# Patient Record
Sex: Female | Born: 1935
Health system: Southern US, Community
[De-identification: ages and names within clinical notes are randomized; demographics above are authoritative.]

## PROBLEM LIST (undated history)

## (undated) DIAGNOSIS — M1990 Unspecified osteoarthritis, unspecified site: Secondary | ICD-10-CM

## (undated) DIAGNOSIS — F32A Depression, unspecified: Secondary | ICD-10-CM

## (undated) DIAGNOSIS — G8929 Other chronic pain: Secondary | ICD-10-CM

## (undated) DIAGNOSIS — H3530 Unspecified macular degeneration: Secondary | ICD-10-CM

## (undated) DIAGNOSIS — F329 Major depressive disorder, single episode, unspecified: Secondary | ICD-10-CM

## (undated) DIAGNOSIS — I10 Essential (primary) hypertension: Secondary | ICD-10-CM

## (undated) DIAGNOSIS — E785 Hyperlipidemia, unspecified: Secondary | ICD-10-CM

## (undated) DIAGNOSIS — S129XXA Fracture of neck, unspecified, initial encounter: Secondary | ICD-10-CM

## (undated) DIAGNOSIS — F0390 Unspecified dementia without behavioral disturbance: Secondary | ICD-10-CM

## (undated) DIAGNOSIS — F419 Anxiety disorder, unspecified: Secondary | ICD-10-CM

## (undated) HISTORY — PX: APPENDECTOMY: SHX54

## (undated) HISTORY — PX: EYE SURGERY: SHX253

## (undated) HISTORY — DX: Hyperlipidemia, unspecified: E78.5

## (undated) HISTORY — PX: BACK SURGERY: SHX140

## (undated) HISTORY — DX: Unspecified osteoarthritis, unspecified site: M19.90

---

## 2001-02-04 ENCOUNTER — Other Ambulatory Visit: Admission: RE | Admit: 2001-02-04 | Discharge: 2001-02-04 | Payer: Self-pay | Admitting: Internal Medicine

## 2002-02-17 ENCOUNTER — Other Ambulatory Visit: Admission: RE | Admit: 2002-02-17 | Discharge: 2002-02-17 | Payer: Self-pay | Admitting: Internal Medicine

## 2003-03-02 ENCOUNTER — Other Ambulatory Visit: Admission: RE | Admit: 2003-03-02 | Discharge: 2003-03-02 | Payer: Self-pay | Admitting: Internal Medicine

## 2006-04-05 ENCOUNTER — Encounter: Admission: RE | Admit: 2006-04-05 | Discharge: 2006-04-05 | Payer: Self-pay | Admitting: Internal Medicine

## 2006-04-17 ENCOUNTER — Encounter: Admission: RE | Admit: 2006-04-17 | Discharge: 2006-04-17 | Payer: Self-pay | Admitting: Internal Medicine

## 2010-04-16 ENCOUNTER — Encounter: Payer: Self-pay | Admitting: Internal Medicine

## 2011-02-24 DIAGNOSIS — I10 Essential (primary) hypertension: Secondary | ICD-10-CM | POA: Insufficient documentation

## 2011-02-24 DIAGNOSIS — Z79899 Other long term (current) drug therapy: Secondary | ICD-10-CM | POA: Insufficient documentation

## 2011-02-24 DIAGNOSIS — M549 Dorsalgia, unspecified: Secondary | ICD-10-CM | POA: Insufficient documentation

## 2011-02-24 DIAGNOSIS — M545 Low back pain, unspecified: Secondary | ICD-10-CM | POA: Insufficient documentation

## 2011-02-25 ENCOUNTER — Emergency Department (HOSPITAL_COMMUNITY): Payer: Medicare Other

## 2011-02-25 ENCOUNTER — Encounter: Payer: Self-pay | Admitting: Emergency Medicine

## 2011-02-25 ENCOUNTER — Emergency Department (HOSPITAL_COMMUNITY)
Admission: EM | Admit: 2011-02-25 | Discharge: 2011-02-25 | Disposition: A | Discharge status: Home / Self Care | Payer: Medicare Other | Attending: Emergency Medicine | Admitting: Emergency Medicine

## 2011-02-25 DIAGNOSIS — M549 Dorsalgia, unspecified: Secondary | ICD-10-CM

## 2011-02-25 HISTORY — DX: Essential (primary) hypertension: I10

## 2011-02-25 LAB — URINALYSIS, ROUTINE W REFLEX MICROSCOPIC
Bilirubin Urine: NEGATIVE
Specific Gravity, Urine: 1.013 (ref 1.005–1.030)
pH: 6 (ref 5.0–8.0)

## 2011-02-25 LAB — URINE MICROSCOPIC-ADD ON

## 2011-02-25 MED ORDER — DIAZEPAM 2 MG PO TABS: 2.0000 mg | ORAL_TABLET | Freq: Three times a day (TID) | ORAL | Status: DC | PRN

## 2011-02-25 MED ORDER — HYDROCODONE-ACETAMINOPHEN 10-325 MG PO TABS
1.0000 | ORAL_TABLET | Freq: Once | ORAL | Status: AC
  Administered 2011-02-25: 1 via ORAL
  Filled 2011-02-25: qty 1

## 2011-02-25 MED ORDER — DIAZEPAM 2 MG PO TABS
2.0000 mg | ORAL_TABLET | Freq: Once | ORAL | Status: AC
  Administered 2011-02-25: 2 mg via ORAL
  Filled 2011-02-25: qty 1

## 2011-02-25 NOTE — ED Notes (Signed)
Pt states she did have xrays done at MD office. Pt also states since last night having some incontinence issues.

## 2011-02-25 NOTE — ED Notes (Signed)
Pt c/o low back pain onset Nov 21, pt states pain radiating down both legs. Denies injury. Pt did see Dr. Selena Batten for pain control was given Tramadol,Hydrocodone and flexeril. No relief

## 2011-02-25 NOTE — ED Notes (Signed)
Patient given DC instructions with follow up care Patient gave verbal understanding Patient is being DC via wheelchair to home with family Patient VS stable.  She is not showing any signs of distress on DC

## 2011-02-25 NOTE — ED Provider Notes (Signed)
History     CSN: [id number] Arrival date & time: 02/25/2011 12:49 AM   First MD Initiated Contact with Patient 02/25/11 0113      Chief Complaint  Patient presents with  . Back Pain    (Consider location/radiation/quality/duration/timing/severity/associated sxs/prior treatment) HPI The patient presents with low back pain. She was in her usual state of health prior to 10 days ago. About that time she gradually developed pain across her lower back. Since onset the pain has become more significant, described as sharp, throbbing. The pain is radiating down both buttocks and proximal posterior thighs. The pain is worse with ambulation, better at rest and while sitting. The patient denies any dysuria, fever, chills, nausea, vomiting, diarrhea, abdominal pain. The patient is seen her primary care physician twice over this time course, and most recently was prescribed tramadol, hydrocodone, Flexeril, which did not seem to offer any relief. Past Medical History  Diagnosis Date  . Hypertension     Past Surgical History  Procedure Date  . Appendectomy   . Eye surgery     History reviewed. No pertinent family history.  History  Substance Use Topics  . Smoking status: Never Smoker   . Smokeless tobacco: Not on file  . Alcohol Use: No    OB History    Grav Para Term Preterm Abortions TAB SAB Ect Mult Living   3 3 3              Review of Systems  All other systems reviewed and are negative.    Allergies  Review of patient's allergies indicates no known allergies.  Home Medications   Current Outpatient Rx  Name Route Sig Dispense Refill  . ACETAMINOPHEN 500 MG PO TABS Oral Take 500 mg by mouth every 6 (six) hours as needed. Pain       . BUPROPION HCL ER (SR) 150 MG PO TB12 Oral Take 150 mg by mouth 2 (two) times daily.      Marland Kitchen CALCIUM CITRATE-VITAMIN D 200-200 MG-UNIT PO TABS Oral Take 1 tablet by mouth daily.      Marland Kitchen VITAMIN D 1000 UNITS PO TABS Oral Take 1,000 Units by  mouth daily.      . CYCLOBENZAPRINE HCL 10 MG PO TABS Oral Take 5-10 mg by mouth 3 (three) times daily as needed. Back pain    . HYDROCODONE-ACETAMINOPHEN 5-500 MG PO TABS Oral Take 1 tablet by mouth every 6 (six) hours as needed. Pain     . IBUPROFEN 200 MG PO TABS Oral Take 200 mg by mouth every 6 (six) hours as needed.      . ICAPS AREDS FORMULA PO Oral Take 1 capsule by mouth 2 (two) times daily.      Marland Kitchen OLMESARTAN MEDOXOMIL 20 MG PO TABS Oral Take 20 mg by mouth daily.      . TRAMADOL HCL 50 MG PO TABS Oral Take 50 mg by mouth every 6 (six) hours as needed. For back pain Maximum dose= 8 tablets per day       BP 151/83  Pulse 106  Temp(Src) 97.1 F (36.2 C) (Oral)  Resp 18  SpO2 100%  Physical Exam  Constitutional: She is oriented to person, place, and time. She appears well-developed and well-nourished.  HENT:  Head: Normocephalic and atraumatic.  Eyes: EOM are normal.  Cardiovascular: Normal rate and regular rhythm.   Pulmonary/Chest: Effort normal and breath sounds normal.  Abdominal: She exhibits no distension.  Musculoskeletal: She exhibits no edema.  Negative straight leg test with either leg. The pain the patient notes with prominently is about the left superior sacroiliac joint. No appreciable bony deformities in the patient's back.  Neurological: She is alert and oriented to person, place, and time.  Skin: Skin is warm and dry.    ED Course  Procedures (including critical care time)   Labs Reviewed  URINALYSIS, ROUTINE W REFLEX MICROSCOPIC   No results found.   No diagnosis found.  XR: no acute findings  MDM  This elderly female presents with one week of low back pain. On exam the patient is in no distress, has no overt signs of systemic infection. Has negative straight leg test his reassuring. The patient had x-ray which demonstrates degenerative changes. The patient noted minimal improvement with oral medications in the ED, but noted that she preferred  to go home rather than stay for inpatient analgesics and further evaluation. She was discharged with additional medications to follow up with her primary care physician in one day        Gerhard Munch, MD 02/25/11 [phone]

## 2011-02-27 ENCOUNTER — Emergency Department (HOSPITAL_COMMUNITY): Payer: Medicare Other

## 2011-02-27 ENCOUNTER — Encounter (HOSPITAL_COMMUNITY): Payer: Self-pay

## 2011-02-27 ENCOUNTER — Inpatient Hospital Stay (HOSPITAL_COMMUNITY)
Admission: EM | Admit: 2011-02-27 | Discharge: 2011-03-05 | DRG: 478 | Disposition: A | Discharge status: Skilled Nursing Facility | Payer: Medicare Other | Attending: Internal Medicine | Admitting: Internal Medicine

## 2011-02-27 DIAGNOSIS — E871 Hypo-osmolality and hyponatremia: Secondary | ICD-10-CM

## 2011-02-27 DIAGNOSIS — M549 Dorsalgia, unspecified: Secondary | ICD-10-CM

## 2011-02-27 DIAGNOSIS — M4640 Discitis, unspecified, site unspecified: Secondary | ICD-10-CM | POA: Insufficient documentation

## 2011-02-27 DIAGNOSIS — J302 Other seasonal allergic rhinitis: Secondary | ICD-10-CM

## 2011-02-27 DIAGNOSIS — I10 Essential (primary) hypertension: Secondary | ICD-10-CM

## 2011-02-27 DIAGNOSIS — M519 Unspecified thoracic, thoracolumbar and lumbosacral intervertebral disc disorder: Principal | ICD-10-CM | POA: Diagnosis present

## 2011-02-27 DIAGNOSIS — M810 Age-related osteoporosis without current pathological fracture: Secondary | ICD-10-CM | POA: Diagnosis present

## 2011-02-27 DIAGNOSIS — E785 Hyperlipidemia, unspecified: Secondary | ICD-10-CM

## 2011-02-27 DIAGNOSIS — H269 Unspecified cataract: Secondary | ICD-10-CM | POA: Insufficient documentation

## 2011-02-27 DIAGNOSIS — D649 Anemia, unspecified: Secondary | ICD-10-CM | POA: Diagnosis present

## 2011-02-27 DIAGNOSIS — R739 Hyperglycemia, unspecified: Secondary | ICD-10-CM

## 2011-02-27 HISTORY — DX: Unspecified macular degeneration: H35.30

## 2011-02-27 LAB — DIFFERENTIAL
Basophils Absolute: 0 10*3/uL (ref 0.0–0.1)
Eosinophils Absolute: 0.1 10*3/uL (ref 0.0–0.7)
Lymphocytes Relative: 18 % (ref 12–46)
Lymphs Abs: 1.3 10*3/uL (ref 0.7–4.0)
Monocytes Relative: 7 % (ref 3–12)
Neutro Abs: 5.3 10*3/uL (ref 1.7–7.7)

## 2011-02-27 LAB — BASIC METABOLIC PANEL
BUN: 15 mg/dL (ref 6–23)
CO2: 27 mEq/L (ref 19–32)
Creatinine, Ser: 0.58 mg/dL (ref 0.50–1.10)
GFR calc Af Amer: >90 mL/min (ref >90)
Glucose, Bld: 118 mg/dL — ABNORMAL HIGH (ref 70–99)
Potassium: 3.9 mEq/L (ref 3.5–5.1)
Sodium: 132 mEq/L — ABNORMAL LOW (ref 135–145)

## 2011-02-27 LAB — CBC
HCT: 33.9 % — ABNORMAL LOW (ref 36.0–46.0)
Platelets: 294 10*3/uL (ref 150–400)

## 2011-02-27 LAB — SEDIMENTATION RATE: Sed Rate: 7 mm/hr (ref 0–22)

## 2011-02-27 MED ORDER — VANCOMYCIN HCL IN DEXTROSE 1-5 GM/200ML-% IV SOLN
1000.0000 mg | Freq: Once | INTRAVENOUS | Status: AC
  Administered 2011-02-27: 1000 mg via INTRAVENOUS
  Filled 2011-02-27: qty 200

## 2011-02-27 MED ORDER — FENTANYL 25 MCG/HR TD PT72
25.0000 ug | MEDICATED_PATCH | TRANSDERMAL | Status: DC
  Administered 2011-02-27 – 2011-03-05 (×3): 25 ug via TRANSDERMAL
  Filled 2011-02-27 (×2): qty 1

## 2011-02-27 MED ORDER — FENTANYL 25 MCG/HR TD PT72: 1.0000 | MEDICATED_PATCH | TRANSDERMAL | Status: AC

## 2011-02-27 MED ORDER — FENTANYL CITRATE 0.05 MG/ML IJ SOLN
50.0000 ug | Freq: Once | INTRAMUSCULAR | Status: AC
  Administered 2011-02-27: 50 ug via INTRAVENOUS
  Filled 2011-02-27: qty 2

## 2011-02-27 MED ORDER — MORPHINE SULFATE 2 MG/ML IJ SOLN
0.5000 mg | INTRAMUSCULAR | Status: DC | PRN
  Administered 2011-02-27: 0.5 mg via INTRAVENOUS
  Filled 2011-02-27: qty 1

## 2011-02-27 MED ORDER — ONDANSETRON HCL 4 MG/2ML IJ SOLN
INTRAMUSCULAR | Status: AC
  Administered 2011-02-27: 4 mg
  Filled 2011-02-27: qty 2

## 2011-02-27 NOTE — ED Notes (Signed)
To MRI.  Family at bedside.

## 2011-02-27 NOTE — ED Notes (Signed)
Called [facility]CU for report, was told to call back in 10-15 minutes.

## 2011-02-27 NOTE — ED Notes (Signed)
Pt reports loss of bladder function x 2 days- Pt reports " I wait too long when I need to go yet it is too painful to get up" Deneis numbness and tingling- Pt is able to move all ext withotu difficulty

## 2011-02-27 NOTE — ED Notes (Signed)
Pt presents with NAD seen here on Saturday for presenting complaint.  Pt c/o of increased back pain despite OTC and RX meds given- Pt is unable to tolerate any position for comfort

## 2011-02-27 NOTE — H&P (Signed)
PCP:   Pearson Grippe, MD, MD   Chief Complaint:  Back Pain for 2 weeks   HPI: This is an 77 year old female, she was on her usual state of health prior to 2 weeks ago, she developed sudden onset lower back pain mainly affecting her right lower extremities, pain is sharp, 10 out of 10 radiating to both buttocks and proximal posterior thigh, the pain is worse with ambulation, better with rest and while sitting, she denies any numbness or weakness of her extremities, she has to see Dr. Selena Batten where an x-ray done, which did show degenerative change, patient plays on multiple pain medications including Vicodin, ibuprofen, tramadol without response, and today presented for the second time to the emergency room where patient underwent an MRI of the lumbar spine which did show possibility of discitis and osteomyelitis, case discussed with Dr. Marikay Alar the neurosurgery where he reviewed the MRI and he admitted that if ESR and C-reactive protein negative consider discharge the patient maintained at the lower back below the lumbar spine and it radiates to her right lower extremity pain. Condition did not associated with loss of bowel habit, or control, denies any loss of weight, denies any burning micturition, denies any fever.She complain of urinary incontinence. She denies any difficulty with working but admitted she did it is so painful to walk  Review of Systems:  The patient denies anorexia, fever, weight loss,, vision loss, decreased hearing, hoarseness, chest pain, syncope, dyspnea on exertion, peripheral edema, balance deficits, hemoptysis, abdominal pain, melena, hematochezia, severe indigestion/heartburn, hematuria,   genital sores, muscle weakness, suspicious skin lesions, transient blindness, depression, unusual weight change, abnormal bleeding, enlarged lymph nodes, angioedema, and breast masses.  Past Medical History: Past Medical History  Diagnosis Date  . Hypertension   . Osteoporosis   .  Macular degeneration    Past Surgical History  Procedure Date  . Appendectomy   . Eye surgery     Medications: Prior to Admission medications   Medication Sig Start Date End Date Taking? Authorizing Provider  acetaminophen (TYLENOL) 500 MG tablet Take 500 mg by mouth every 6 (six) hours as needed. Pain      Yes Historical Provider, MD  buPROPion (WELLBUTRIN SR) 150 MG 12 hr tablet Take 150 mg by mouth daily.    Yes Historical Provider, MD  calcium citrate-vitamin D 200-200 MG-UNIT TABS Take 1 tablet by mouth daily.    Yes Historical Provider, MD  cholecalciferol (VITAMIN D) 1000 UNITS tablet Take 1,000 Units by mouth every morning.    Yes Historical Provider, MD  diazepam (VALIUM) 2 MG tablet Take 2 mg by mouth every 8 (eight) hours as needed. MUSCLE SPASMS  02/25/11 03/07/11 Yes Gerhard Munch, MD  HYDROcodone-acetaminophen (VICODIN) 5-500 MG per tablet Take 1 tablet by mouth every 6 (six) hours as needed. Pain   Yes Historical Provider, MD  ibuprofen (ADVIL,MOTRIN) 200 MG tablet Take 200 mg by mouth every 6 (six) hours as needed. PAIN   Yes Historical Provider, MD  Multiple Vitamins-Minerals (ICAPS AREDS FORMULA PO) Take 1 capsule by mouth 2 (two) times daily.    Yes Historical Provider, MD  olmesartan (BENICAR) 20 MG tablet Take 20 mg by mouth daily.    Yes Historical Provider, MD  traMADol (ULTRAM) 50 MG tablet Take 50 mg by mouth every 6 (six) hours as needed. For back pain Maximum dose= 8 tablets per day PAIN   Yes Historical Provider, MD  fentaNYL (DURAGESIC - DOSED MCG/HR) 25 MCG/HR Place 1 patch (  25 mcg total) onto the skin every 3 (three) days. 02/27/11 03/29/11  Nelia Shi, MD    Allergies:  No Known Allergies  Social History:  reports that she has never smoked. She has never used smokeless tobacco. She reports that she does not drink alcohol or use illicit drugs.she lives alone  Family History: Non contributive Physical Exam: Filed Vitals:   02/27/11 1312 02/27/11  1620 02/27/11 2011  BP: 165/90 140/72 142/69  Pulse:  100 97  Temp: 98.1 F (36.7 C)  97.9 F (36.6 C)  TempSrc: Oral  Oral  Resp: 18 16 16   Weight: 48.988 kg (108 lb)    SpO2: 93% 99% 97%   Patient lying on bed not on pain, or respiratory distress pupil equal reactive to light and accommodation, neck supple no lymphadenopathy, Heart S1 and S2 with no added sounds Lung examination normal vesicular breathing with equal air entry Abdomen soft nontender bowel sounds present although she have her bladder seems full but a patient was patient is able to void Extremity without lower limb edema peripheral pulses intact CNS patient awake alert oriented x3, cranial never seem intact lower extremities patient able to flex her knees, straight leg elevation is positive on the right, there is no point tenderness on her lumbar spine, both 5 over 5 on both upper and lower extremities reflexes brisk sensation intact light touch   Labs on Admission:   Basename 02/27/11 1315  NA 132*  K 3.9  CL 95*  CO2 27  GLUCOSE 118*  BUN 15  CREATININE 0.58  CALCIUM 9.8  MG --  PHOS --   No results found for this basename: AST:2,ALT:2,ALKPHOS:2,BILITOT:2,PROT:2,ALBUMIN:2 in the last 72 hours No results found for this basename: LIPASE:2,AMYLASE:2 in the last 72 hours  Basename 02/27/11 1315  WBC 7.2  NEUTROABS 5.3  HGB 11.7*  HCT 33.9*  MCV 91.4  PLT 294  ESR 7 Radiological Exams on Admission: Dg Lumbar Spine 2-3 Views  02/25/2011  *RADIOLOGY REPORT*  Clinical Data: Lower back pain, radiating down both legs.  LUMBAR SPINE - 2-3 VIEW  Comparison: Lumbar spine radiographs performed 02/23/2011  Findings: There is no evidence of acute fracture or subluxation. There is mild left lateral listhesis of L3 on L4.  Multilevel disc space narrowing is noted along the lumbar spine, with associated vacuum phenomenon and endplate sclerotic change.  The appearance is stable from the recent prior study.  The visualized  bowel gas pattern is unremarkable in appearance; air and stool are noted within the colon.  The sacroiliac joints are within normal limits.  IMPRESSION:  1.  No evidence of acute fracture or subluxation along the lumbar spine. 2.  Stable degenerative changes noted along the lumbar spine, with mild left lateral listhesis of L3 on L4.  Original Report Authenticated By: Tonia Ghent, M.D.   Mr Lumbar Spine Wo Contrast  02/27/2011  *RADIOLOGY REPORT*  Clinical Data: Severe low back pain.  MRI LUMBAR SPINE WITHOUT CONTRAST  Technique:  Multiplanar and multiecho pulse sequences of the lumbar spine were obtained without intravenous contrast.  Comparison: Plain films from 02/23/2011 and 02/25/2011.  Findings: There is degenerative scoliosis convex left in the upper lumbar region.  Asymmetric loss of interspace height L2-3 on the right is present, along with approximately 1 cm translation of L3 leftward on L4.  Asymmetric loss of interspace height on the left is seen at L4-L5. The L5-S1 disc space is diffusely narrowed.  The conus is normal.  The gallbladder  is markedly distended.  The bladder is distended. Renal cystic disease is incompletely evaluated.  The appearance of the L3-4 disc space is abnormal, with hyperintensity of the disc as well as the endplates above and below.  There is slight depression of the superior endplate of L4 with questionable loss of cortical margin  centrally.  I do not see a paraspinous or epidural soft tissue component. Concern is raised for infectious diskitis and osteomyelitis.  The individual disc spaces are examined as follows:  L1-2:  Mild bulge. No stenosis or disc protrusion.  L2-3:  Asymmetric disc space narrowing on the right without focal protrusion.  Mild central canal stenosis is present along with facet arthropathy.  Asymmetric foraminal narrowing on the delayed potentially compresses the right L2 nerve root.  L3-4:  Central protrusion.  Advanced posterior element hypertrophy.  Severe multifactorial spinal stenosis, with left greater than right L4 nerve root encroachment.  Bilateral neural foraminal narrowing appears to affect the L3 nerve roots, slightly worse on the left.  L4-5:    Moderate central canal stenosis secondary to posterior element hypertrophy and central bulging annular fibers.  Left L5 nerve root encroachment is likely.  Left-sided neural foraminal narrowing due to bony overgrowth and disc material could affect the L4 nerve root.  L5-S1:   Severe disc space narrowing.  Mild to moderate facet arthropathy.  Central and leftward protrusion extends into the foramen.  Left L5 and left  S1 nerve root encroachment are likely.  IMPRESSION: Discal hyperintensity at L3-4 along with endplate edema and irregularity above and below is concerning for diskitis and adjacent osteomyelitis.  Correlate with clinical clinical findings and laboratory results.  Percutaneous aspiration may be necessary for further evaluation.  Multilevel spondylosis as described; potential exists for significant nerve root compression from L3-4 through L5-S1.  Original Report Authenticated By: Elsie Stain, M.D.    Assessment/Plan 1- lower back pain for  2 weeks. MRI would just L3-L4 8 edema concern for discitis and osteomyelitis, patient has no point tenderness at that site, and this case discussed with Dr. Marikay Alar in neurosurgery. Did not feel need intervention at this time. On examining the patient I felt the patient could have nerve compressionor diskitis, will treat with pain medication, will ask orthopedics and infectious disease to assess the above finding,will ask IR to CT GUIDED biopsy and send sample for culture, this could be related to nerve compression. Will check CPR. Patient has no neuro deficit currently.i discuss with orthopedics on call will hold on antibiotics after seen by [facility], patient currently afebrile and has no neuro deficit. Riyaan Heroux I. 02/27/2011, 10:27 PM

## 2011-02-27 NOTE — ED Provider Notes (Signed)
History     CSN: [id number] Arrival date & time: 02/27/2011  1:00 PM   First MD Initiated Contact with Patient 02/27/11 1412      Chief Complaint  Patient presents with  . Back Pain    return visit seen here this past Saturday for presenting complaint    (Consider location/radiation/quality/duration/timing/severity/associated sxs/prior treatment) HPI The patient presents with low back pain. She was in her usual state of health prior to 13 days ago. About that time she gradually developed pain across her lower back. Since onset the pain has become more significant, described as sharp, throbbing. The pain is radiating down both buttocks and proximal posterior thighs. The pain is worse with ambulation, better at rest and while sitting. The patient denies any dysuria, fever, chills, nausea, vomiting, diarrhea, abdominal pain.  The patient is seen her primary care physician twice over this time course, and most recently was prescribed tramadol, hydrocodone, Flexeril, which did not seem to offer any relief.   t  Past Medical History  Diagnosis Date  . Hypertension   . Osteoporosis   . Macular degeneration     Past Surgical History  Procedure Date  . Appendectomy   . Eye surgery     No family history on file.  History  Substance Use Topics  . Smoking status: Never Smoker   . Smokeless tobacco: Not on file  . Alcohol Use: No    OB History    Grav Para Term Preterm Abortions TAB SAB Ect Mult Living   3 3 3              Review of Systems All other review of systems are negative except as noted in history of present illness Allergies  Review of patient's allergies indicates no known allergies.  Home Medications   Current Outpatient Rx  Name Route Sig Dispense Refill  . ACETAMINOPHEN 500 MG PO TABS Oral Take 500 mg by mouth every 6 (six) hours as needed. Pain       . BUPROPION HCL ER (SR) 150 MG PO TB12 Oral Take 150 mg by mouth daily.     Marland Kitchen CALCIUM CITRATE-VITAMIN D  200-200 MG-UNIT PO TABS Oral Take 1 tablet by mouth daily.     Marland Kitchen VITAMIN D 1000 UNITS PO TABS Oral Take 1,000 Units by mouth every morning.     Marland Kitchen DIAZEPAM 2 MG PO TABS Oral Take 2 mg by mouth every 8 (eight) hours as needed. MUSCLE SPASMS     . HYDROCODONE-ACETAMINOPHEN 5-500 MG PO TABS Oral Take 1 tablet by mouth every 6 (six) hours as needed. Pain    . IBUPROFEN 200 MG PO TABS Oral Take 200 mg by mouth every 6 (six) hours as needed. PAIN    . ICAPS AREDS FORMULA PO Oral Take 1 capsule by mouth 2 (two) times daily.     Marland Kitchen OLMESARTAN MEDOXOMIL 20 MG PO TABS Oral Take 20 mg by mouth daily.     . TRAMADOL HCL 50 MG PO TABS Oral Take 50 mg by mouth every 6 (six) hours as needed. For back pain Maximum dose= 8 tablets per day PAIN      BP 165/90  Temp(Src) 98.1 F (36.7 C) (Oral)  Resp 18  Wt 108 lb (48.988 kg)  SpO2 93%  Physical Exam  Nursing note and vitals reviewed. Constitutional: She is oriented to person, place, and time. She appears well-developed and well-nourished. No distress.  HENT:  Head: Normocephalic and atraumatic.  Eyes: Pupils  are equal, round, and reactive to light.  Neck: Normal range of motion.  Cardiovascular: Normal rate and intact distal pulses.   Pulmonary/Chest: Effort normal. No respiratory distress.  Abdominal: Soft. Normal appearance. She exhibits no distension and no mass.  Musculoskeletal:       Back:  Neurological: She is alert and oriented to person, place, and time. No cranial nerve deficit.  Skin: Skin is warm and dry. No rash noted.  Psychiatric: She has a normal mood and affect. Her behavior is normal.    ED Course  Procedures (including critical care time)  Labs Reviewed - No data to display Mr Lumbar Spine Wo Contrast  02/27/2011  *RADIOLOGY REPORT*  Clinical Data: Severe low back pain.  MRI LUMBAR SPINE WITHOUT CONTRAST  Technique:  Multiplanar and multiecho pulse sequences of the lumbar spine were obtained without intravenous contrast.   Comparison: Plain films from 02/23/2011 and 02/25/2011.  Findings: There is degenerative scoliosis convex left in the upper lumbar region.  Asymmetric loss of interspace height L2-3 on the right is present, along with approximately 1 cm translation of L3 leftward on L4.  Asymmetric loss of interspace height on the left is seen at L4-L5. The L5-S1 disc space is diffusely narrowed.  The conus is normal.  The gallbladder is markedly distended.  The bladder is distended. Renal cystic disease is incompletely evaluated.  The appearance of the L3-4 disc space is abnormal, with hyperintensity of the disc as well as the endplates above and below.  There is slight depression of the superior endplate of L4 with questionable loss of cortical margin  centrally.  I do not see a paraspinous or epidural soft tissue component. Concern is raised for infectious diskitis and osteomyelitis.  The individual disc spaces are examined as follows:  L1-2:  Mild bulge. No stenosis or disc protrusion.  L2-3:  Asymmetric disc space narrowing on the right without focal protrusion.  Mild central canal stenosis is present along with facet arthropathy.  Asymmetric foraminal narrowing on the delayed potentially compresses the right L2 nerve root.  L3-4:  Central protrusion.  Advanced posterior element hypertrophy. Severe multifactorial spinal stenosis, with left greater than right L4 nerve root encroachment.  Bilateral neural foraminal narrowing appears to affect the L3 nerve roots, slightly worse on the left.  L4-5:    Moderate central canal stenosis secondary to posterior element hypertrophy and central bulging annular fibers.  Left L5 nerve root encroachment is likely.  Left-sided neural foraminal narrowing due to bony overgrowth and disc material could affect the L4 nerve root.  L5-S1:   Severe disc space narrowing.  Mild to moderate facet arthropathy.  Central and leftward protrusion extends into the foramen.  Left L5 and left  S1 nerve root  encroachment are likely.  IMPRESSION: Discal hyperintensity at L3-4 along with endplate edema and irregularity above and below is concerning for diskitis and adjacent osteomyelitis.  Correlate with clinical clinical findings and laboratory results.  Percutaneous aspiration may be necessary for further evaluation.  Multilevel spondylosis as described; potential exists for significant nerve root compression from L3-4 through L5-S1.  Original Report Authenticated By: Elsie Stain, M.D.     1. Back pain       MDM         Nelia Shi, MD 02/27/11 [phone]

## 2011-02-27 NOTE — ED Notes (Signed)
Urine specimen obtained, in mini lab.

## 2011-02-27 NOTE — Discharge Planning (Signed)
Cm noted Admission Rn Cm consult for home health- ?iv antibiotics. Spoke with pt who confirmed may be d/c home with iv antibiotics Has never used a home health agency CM reviewed home health agencies versus private duty including tasks, coverage, limitations. Answered all questions.Provided with list of [place] home health providers to view  In order to make her choice.  Will update CM when make a decision

## 2011-02-27 NOTE — ED Provider Notes (Signed)
4:26 PM  MRI shows diskitis Vancomycin ordered Will consult nsgy BP 140/72  Pulse 100  Temp(Src) 98.1 F (36.7 C) (Oral)  Resp 16  Wt 108 lb (48.988 kg)  SpO2 99%   5:01 PM D/w dr Yetta Barre, nsgy He reviewed MRI If she does not have leg pain and normal sed rate, she can be discharged  6:15 PM Pt reports pain radiating to right LE No gross motor deficits in her LE D/w dr Eda Paschal, will admit to [facility]ad   Joya Gaskins, MD 02/27/11 1816

## 2011-02-27 NOTE — ED Notes (Signed)
MD notified that pt is ready to be discharged

## 2011-02-28 ENCOUNTER — Emergency Department (HOSPITAL_COMMUNITY): Payer: Medicare Other

## 2011-02-28 ENCOUNTER — Other Ambulatory Visit (HOSPITAL_COMMUNITY): Payer: Medicare Other

## 2011-02-28 ENCOUNTER — Encounter (HOSPITAL_COMMUNITY): Payer: Self-pay | Admitting: Physician Assistant

## 2011-02-28 DIAGNOSIS — E871 Hypo-osmolality and hyponatremia: Secondary | ICD-10-CM | POA: Diagnosis present

## 2011-02-28 DIAGNOSIS — D649 Anemia, unspecified: Secondary | ICD-10-CM | POA: Diagnosis present

## 2011-02-28 LAB — CBC
HCT: 34.6 % — ABNORMAL LOW (ref 36.0–46.0)
Hemoglobin: 11.9 g/dL — ABNORMAL LOW (ref 12.0–15.0)
MCH: 31.6 pg (ref 26.0–34.0)
MCHC: 34.4 g/dL (ref 30.0–36.0)
MCV: 92 fL (ref 78.0–100.0)

## 2011-02-28 LAB — COMPREHENSIVE METABOLIC PANEL
ALT: 13 U/L (ref 0–35)
AST: 14 U/L (ref 0–37)
Alkaline Phosphatase: 96 U/L (ref 39–117)
Calcium: 9.1 mg/dL (ref 8.4–10.5)
Potassium: 3.5 mEq/L (ref 3.5–5.1)
Sodium: 133 mEq/L — ABNORMAL LOW (ref 135–145)
Total Protein: 6.7 g/dL (ref 6.0–8.3)

## 2011-02-28 MED ORDER — HEPARIN SODIUM (PORCINE) 5000 UNIT/ML IJ SOLN
5000.0000 [IU] | Freq: Three times a day (TID) | INTRAMUSCULAR | Status: DC
  Administered 2011-02-28: 5000 [IU] via SUBCUTANEOUS
  Filled 2011-02-28 (×5): qty 1

## 2011-02-28 MED ORDER — VITAMIN D3 25 MCG (1000 UNIT) PO TABS
1000.0000 [IU] | ORAL_TABLET | ORAL | Status: DC
  Administered 2011-02-28 – 2011-03-05 (×6): 1000 [IU] via ORAL
  Filled 2011-02-28 (×8): qty 1

## 2011-02-28 MED ORDER — MORPHINE SULFATE 2 MG/ML IJ SOLN
2.0000 mg | INTRAMUSCULAR | Status: DC | PRN
  Administered 2011-03-01 – 2011-03-02 (×4): 2 mg via INTRAVENOUS
  Filled 2011-02-28 (×4): qty 1

## 2011-02-28 MED ORDER — SENNA 8.6 MG PO TABS
1.0000 | ORAL_TABLET | Freq: Two times a day (BID) | ORAL | Status: DC
  Administered 2011-02-28 – 2011-03-04 (×5): 8.6 mg via ORAL
  Filled 2011-02-28 (×9): qty 1

## 2011-02-28 MED ORDER — CALCIUM CARBONATE-VITAMIN D 500-200 MG-UNIT PO TABS
1.0000 | ORAL_TABLET | Freq: Every day | ORAL | Status: DC
  Administered 2011-02-28 – 2011-03-05 (×6): 1 via ORAL
  Filled 2011-02-28 (×7): qty 1

## 2011-02-28 MED ORDER — SODIUM CHLORIDE 0.9 % IV SOLN
INTRAVENOUS | Status: DC
  Administered 2011-02-28 – 2011-03-01 (×2): via INTRAVENOUS

## 2011-02-28 MED ORDER — VANCOMYCIN HCL IN DEXTROSE 1-5 GM/200ML-% IV SOLN
1000.0000 mg | INTRAVENOUS | Status: DC
  Filled 2011-02-28: qty 200

## 2011-02-28 MED ORDER — FENTANYL CITRATE 0.05 MG/ML IJ SOLN
INTRAMUSCULAR | Status: AC | PRN
  Administered 2011-02-28: 50 ug via INTRAVENOUS

## 2011-02-28 MED ORDER — ACETAMINOPHEN 500 MG PO TABS
500.0000 mg | ORAL_TABLET | Freq: Four times a day (QID) | ORAL | Status: DC | PRN
  Administered 2011-03-05: 500 mg via ORAL
  Filled 2011-02-28: qty 1

## 2011-02-28 MED ORDER — HYDROCODONE-ACETAMINOPHEN 5-325 MG PO TABS
1.0000 | ORAL_TABLET | Freq: Four times a day (QID) | ORAL | Status: DC | PRN
  Administered 2011-02-28 – 2011-03-05 (×8): 1 via ORAL
  Filled 2011-02-28 (×8): qty 1

## 2011-02-28 MED ORDER — MIDAZOLAM HCL 5 MG/5ML IJ SOLN
INTRAMUSCULAR | Status: AC | PRN
  Administered 2011-02-28: 1 mg via INTRAVENOUS

## 2011-02-28 MED ORDER — BUPROPION HCL ER (SR) 150 MG PO TB12
150.0000 mg | ORAL_TABLET | Freq: Every day | ORAL | Status: DC
  Administered 2011-02-28 – 2011-03-05 (×6): 150 mg via ORAL
  Filled 2011-02-28 (×7): qty 1

## 2011-02-28 MED ORDER — OLMESARTAN MEDOXOMIL 20 MG PO TABS
20.0000 mg | ORAL_TABLET | Freq: Every day | ORAL | Status: DC
  Administered 2011-02-28 – 2011-03-05 (×6): 20 mg via ORAL
  Filled 2011-02-28 (×7): qty 1

## 2011-02-28 MED ORDER — VANCOMYCIN HCL IN DEXTROSE 1-5 GM/200ML-% IV SOLN
1000.0000 mg | Freq: Once | INTRAVENOUS | Status: AC
  Administered 2011-02-28: 1000 mg via INTRAVENOUS
  Filled 2011-02-28: qty 200

## 2011-02-28 MED ORDER — DOCUSATE SODIUM 100 MG PO CAPS
100.0000 mg | ORAL_CAPSULE | Freq: Two times a day (BID) | ORAL | Status: DC
  Administered 2011-02-28 – 2011-03-05 (×8): 100 mg via ORAL
  Filled 2011-02-28 (×15): qty 1

## 2011-02-28 MED ORDER — HEPARIN SODIUM (PORCINE) 5000 UNIT/ML IJ SOLN
5000.0000 [IU] | Freq: Three times a day (TID) | INTRAMUSCULAR | Status: DC
  Administered 2011-02-28 – 2011-03-05 (×12): 5000 [IU] via SUBCUTANEOUS
  Filled 2011-02-28 (×21): qty 1

## 2011-02-28 MED ORDER — ONDANSETRON HCL 4 MG/2ML IJ SOLN
4.0000 mg | Freq: Three times a day (TID) | INTRAMUSCULAR | Status: DC | PRN
  Administered 2011-02-28 – 2011-03-01 (×2): 4 mg via INTRAVENOUS
  Filled 2011-02-28 (×2): qty 2

## 2011-02-28 NOTE — Progress Notes (Signed)
ANTIBIOTIC CONSULT NOTE - INITIAL  Pharmacy Consult for Vancomycin  Indication: r/o osteo, diskitis   No Known Allergies  Patient Measurements: Weight: 108 lb (48.988 kg) Adjusted Body Weight:   Vital Signs: Temp: 97.6 F (36.4 C) (12/05 0034) Temp src: Oral (12/05 0034) BP: 168/68 mmHg (12/05 0034) Pulse Rate: 94  (12/05 0034) Intake/Output from previous day: 12/04 0701 - 12/05 0700 In: -  Out: 200 [Urine:200] Intake/Output from this shift: Total I/O In: -  Out: 200 [Urine:200]  Labs:  [facility] 02/27/11 1315  WBC 7.2  HGB 11.7*  PLT 294  LABCREA --  CREATININE 0.58   CrCl is unknown because there is no height on file for the current visit. No results found for this basename: VANCOTROUGH:2,VANCOPEAK:2,VANCORANDOM:2,GENTTROUGH:2,GENTPEAK:2,GENTRANDOM:2,TOBRATROUGH:2,TOBRAPEAK:2,TOBRARND:2,AMIKACINPEAK:2,AMIKACINTROU:2,AMIKACIN:2, in the last 72 hours   Microbiology: No results found for this or any previous visit (from the past 720 hour(s)).  Medical History: Past Medical History  Diagnosis Date  . Hypertension   . Osteoporosis   . Macular degeneration     Medications:  Anti-infectives     Start     Dose/Rate Route Frequency Ordered Stop   02/28/11 1700   vancomycin (VANCOCIN) IVPB 1000 mg/200 mL premix        1,000 mg 200 mL/hr over 60 Minutes Intravenous Every 24 hours 02/28/11 0331     02/27/11 1730   vancomycin (VANCOCIN) IVPB 1000 mg/200 mL premix        1,000 mg 200 mL/hr over 60 Minutes Intravenous  Once 02/27/11 1626 02/27/11 1820         Assessment: Patient is diskitis and r/o osteomyletitis. First dose of antibiotics already given.  Renal function is est. to be okay despite age and low weight.  Goal of Therapy:  Vancomycin trough level 15-20 mcg/ml  Plan:  Measure antibiotic drug levels at steady state Follow up culture results Vancomycin 1gm iv q24hr  Darlina Guys, Jacquenette Shone Crowford 02/28/2011,3:31 AM

## 2011-02-28 NOTE — ED Notes (Signed)
Patient is resting comfortably. 

## 2011-02-28 NOTE — Consult Note (Signed)
Reason for Consult:severe low back pain Referring Physician: [name] is an 77 y.o. female.  HPI: 77 yo female with several week history of back and leg pain.  Pt was having l. Leg pain and then pain shifted to r. Leg and now with severe r. Leg pain and back pain.  Family thinks she is some better since arriving here.  Past Medical History  Diagnosis Date  . Hypertension   . Osteoporosis   . Macular degeneration     Past Surgical History  Procedure Date  . Appendectomy   . Eye surgery     History reviewed. No pertinent family history.  Social History:  reports that she has never smoked. She has never used smokeless tobacco. She reports that she does not drink alcohol or use illicit drugs.  Allergies: No Known Allergies  Medications: available in chart  Results for orders placed during the hospital encounter of 02/27/11 (from the past 48 hour(s))  CBC     Status: Abnormal   Collection Time   02/27/11  1:15 PM      Component Value Range Comment   WBC 7.2  4.0 - 10.5 (K/uL)    RBC 3.71 (*) 3.87 - 5.11 (MIL/uL)    Hemoglobin 11.7 (*) 12.0 - 15.0 (g/dL)    HCT 14.7 (*) 82.9 - 46.0 (%)    MCV 91.4  78.0 - 100.0 (fL)    MCH 31.5  26.0 - 34.0 (pg)    MCHC 34.5  30.0 - 36.0 (g/dL)    RDW 56.2  13.0 - 86.5 (%)    Platelets 294  150 - 400 (K/uL)   DIFFERENTIAL     Status: Normal   Collection Time   02/27/11  1:15 PM      Component Value Range Comment   Neutrophils Relative 74  43 - 77 (%)    Neutro Abs 5.3  1.7 - 7.7 (K/uL)    Lymphocytes Relative 18  12 - 46 (%)    Lymphs Abs 1.3  0.7 - 4.0 (K/uL)    Monocytes Relative 7  3 - 12 (%)    Monocytes Absolute 0.5  0.1 - 1.0 (K/uL)    Eosinophils Relative 1  0 - 5 (%)    Eosinophils Absolute 0.1  0.0 - 0.7 (K/uL)    Basophils Relative 1  0 - 1 (%)    Basophils Absolute 0.0  0.0 - 0.1 (K/uL)   SEDIMENTATION RATE     Status: Normal   Collection Time   02/27/11  1:15 PM      Component Value Range Comment   Sed  Rate 7  0 - 22 (mm/hr)   BASIC METABOLIC PANEL     Status: Abnormal   Collection Time   02/27/11  1:15 PM      Component Value Range Comment   Sodium 132 (*) 135 - 145 (mEq/L)    Potassium 3.9  3.5 - 5.1 (mEq/L)    Chloride 95 (*) 96 - 112 (mEq/L)    CO2 27  19 - 32 (mEq/L)    Glucose, Bld 118 (*) 70 - 99 (mg/dL)    BUN 15  6 - 23 (mg/dL)    Creatinine, Ser 7.84  0.50 - 1.10 (mg/dL)    Calcium 9.8  8.4 - 10.5 (mg/dL)    GFR calc non Af Amer 80 (*) >90 (mL/min)    GFR calc Af Amer >90  >90 (mL/min)   C-REACTIVE PROTEIN  Status: Abnormal   Collection Time   02/27/11 10:50 PM      Component Value Range Comment   CRP 0.08 (*) <0.60 (mg/dL)   COMPREHENSIVE METABOLIC PANEL     Status: Abnormal   Collection Time   02/28/11  6:25 AM      Component Value Range Comment   Sodium 133 (*) 135 - 145 (mEq/L)    Potassium 3.5  3.5 - 5.1 (mEq/L)    Chloride 98  96 - 112 (mEq/L)    CO2 28  19 - 32 (mEq/L)    Glucose, Bld 125 (*) 70 - 99 (mg/dL)    BUN 11  6 - 23 (mg/dL)    Creatinine, Ser 0.45  0.50 - 1.10 (mg/dL)    Calcium 9.1  8.4 - 10.5 (mg/dL)    Total Protein 6.7  6.0 - 8.3 (g/dL)    Albumin 3.5  3.5 - 5.2 (g/dL)    AST 14  0 - 37 (U/L)    ALT 13  0 - 35 (U/L)    Alkaline Phosphatase 96  39 - 117 (U/L)    Total Bilirubin 0.2 (*) 0.3 - 1.2 (mg/dL)    GFR calc non Af Amer 81 (*) >90 (mL/min)    GFR calc Af Amer >90  >90 (mL/min)   CBC     Status: Abnormal   Collection Time   02/28/11  6:25 AM      Component Value Range Comment   WBC 6.9  4.0 - 10.5 (K/uL)    RBC 3.76 (*) 3.87 - 5.11 (MIL/uL)    Hemoglobin 11.9 (*) 12.0 - 15.0 (g/dL)    HCT 40.9 (*) 81.1 - 46.0 (%)    MCV 92.0  78.0 - 100.0 (fL)    MCH 31.6  26.0 - 34.0 (pg)    MCHC 34.4  30.0 - 36.0 (g/dL)    RDW 91.4  78.2 - 95.6 (%)    Platelets 273  150 - 400 (K/uL)   PROTIME-INR     Status: Normal   Collection Time   02/28/11 11:27 AM      Component Value Range Comment   Prothrombin Time 13.3  11.6 - 15.2 (seconds)     INR 0.99  0.00 - 1.49      Dg Pelvis 1-2 Views  02/28/2011  *RADIOLOGY REPORT*  Clinical Data: Right hip pain.  PELVIS - 1-2 VIEW  Comparison: CT of the abdomen and pelvis performed 04/17/2006  Findings: There is no evidence of fracture or dislocation.  Both femoral heads are seated normally within their respective acetabula.  Mild degenerative change is noted along the right greater femoral trochanter.  There is mild left convex lumbar scoliosis.  The sacroiliac joints are unremarkable in appearance.  The visualized bowel gas pattern is grossly unremarkable in appearance.  IMPRESSION:  1.  No evidence of fracture or dislocation. 2.  Mild left convex lumbar scoliosis.  Original Report Authenticated By: Nancy Meyer, M.D.   Mr Lumbar Spine Wo Contrast  02/27/2011  *RADIOLOGY REPORT*  Clinical Data: Severe low back pain.  MRI LUMBAR SPINE WITHOUT CONTRAST  Technique:  Multiplanar and multiecho pulse sequences of the lumbar spine were obtained without intravenous contrast.  Comparison: Plain films from 02/23/2011 and 02/25/2011.  Findings: There is degenerative scoliosis convex left in the upper lumbar region.  Asymmetric loss of interspace height L2-3 on the right is present, along with approximately 1 cm translation of L3 leftward on L4.  Asymmetric loss of interspace  height on the left is seen at L4-L5. The L5-S1 disc space is diffusely narrowed.  The conus is normal.  The gallbladder is markedly distended.  The bladder is distended. Renal cystic disease is incompletely evaluated.  The appearance of the L3-4 disc space is abnormal, with hyperintensity of the disc as well as the endplates above and below.  There is slight depression of the superior endplate of L4 with questionable loss of cortical margin  centrally.  I do not see a paraspinous or epidural soft tissue component. Concern is raised for infectious diskitis and osteomyelitis.  The individual disc spaces are examined as follows:  L1-2:  Mild bulge.  No stenosis or disc protrusion.  L2-3:  Asymmetric disc space narrowing on the right without focal protrusion.  Mild central canal stenosis is present along with facet arthropathy.  Asymmetric foraminal narrowing on the delayed potentially compresses the right L2 nerve root.  L3-4:  Central protrusion.  Advanced posterior element hypertrophy. Severe multifactorial spinal stenosis, with left greater than right L4 nerve root encroachment.  Bilateral neural foraminal narrowing appears to affect the L3 nerve roots, slightly worse on the left.  L4-5:    Moderate central canal stenosis secondary to posterior element hypertrophy and central bulging annular fibers.  Left L5 nerve root encroachment is likely.  Left-sided neural foraminal narrowing due to bony overgrowth and disc material could affect the L4 nerve root.  L5-S1:   Severe disc space narrowing.  Mild to moderate facet arthropathy.  Central and leftward protrusion extends into the foramen.  Left L5 and left  S1 nerve root encroachment are likely.  IMPRESSION: Discal hyperintensity at L3-4 along with endplate edema and irregularity above and below is concerning for diskitis and adjacent osteomyelitis.  Correlate with clinical clinical findings and laboratory results.  Percutaneous aspiration may be necessary for further evaluation.  Multilevel spondylosis as described; potential exists for significant nerve root compression from L3-4 through L5-S1.  Original Report Authenticated By: Elsie Stain, M.D.    ROS no + responses as it relates to hpi Blood pressure 145/64, pulse 105, temperature 98.2 F (36.8 C), temperature source Oral, resp. rate 18, height 5\' 3"  (1.6 m), weight 48.988 kg (108 lb), SpO2 96.00%. wdwn female nad. Eyes not dilated not using accessory muscles of respiration no rashes on exposed skin Lower ext Strength 5/5 all groups.sensation intact.   Assessment/Plan: 77 yo female with several wk history of severe back and bilat leg pain  initially l. Leg and now r. Leg with severe canal stenosis and mri slightly suggestive for disc infection/ will talk with int med but would use high dose steroids and do disc aspiration then try iv abx but my suspicion is for compression not infection.  Will follow.  Contrina Orona L 02/28/2011, 12:27 PM      2

## 2011-02-28 NOTE — Interval H&P Note (Cosign Needed)
History and Physical Interval Note: MRI reviewed with Dr. Maryclare Bean - will proceed with fluoro guided L3-4 disk aspiration to r/o diskitis/osteoporosis.    02/28/2011 9:25 AM  Nancy Meyer  has presented today for surgery, with the diagnosis of * possible diskitis/osteomyelitis of L3-4. *  The various methods of treatment have been discussed with the patient and family. After consideration of risks, benefits and other options for treatment, the patient has consented to L3-4 disk aspiration as a surgical intervention .  The patients' history has been reviewed, patient examined, no change in status, stable for surgery.  I have reviewed the patients' chart and labs.  Questions were answered to the patient's satisfaction.  Written consent obtained.  Will proceed with biopsy later today after 6 hours NPO for patient to have moderate sedation.   CAMPBELL,PAMELA D, PA_C

## 2011-02-28 NOTE — Progress Notes (Signed)
ORTHO addendum to consult Would recommend treatment with corticosteroids if no contr-indications.  Will follow while in hospital.

## 2011-02-28 NOTE — Procedures (Signed)
L3-4 disk aspiration. No comp Bloody fluid.

## 2011-02-28 NOTE — Progress Notes (Signed)
Subjective: Pt relates her pain is improved no numbness, had being voiding with out any trouble. Pain control with narcotics. Objective: Filed Vitals:   02/28/11 1451 02/28/11 1456 02/28/11 1606 02/28/11 1631  BP: 159/84 157/86 157/75 149/80  Pulse: 102 102 92 98  Temp:      TempSrc:      Resp: 16 13    Height:      Weight:      SpO2: 100% 100% 94% 94%   Weight change:   Intake/Output Summary (Last 24 hours) at 02/28/11 1658 Last data filed at 02/28/11 1424  Gross per 24 hour  Intake      0 ml  Output    400 ml  Net   -400 ml    General: Alert, awake, oriented x3, in no acute distress.  HEENT: No bruits, no goiter.  Heart: Regular rate and rhythm, without murmurs, rubs, gallops.  Lungs: good air movement, CTA B/l Abdomen: Soft, nontender, nondistended, positive bowel sounds.  Neuro: Grossly intact, nonfocal. No weakness, sensation intact.   Lab Results:  [facility] 02/28/11 0625 02/27/11 1315  NA 133* 132*  K 3.5 3.9  CL 98 95*  CO2 28 27  GLUCOSE 125* 118*  BUN 11 15  CREATININE 0.55 0.58  CALCIUM 9.1 9.8  MG -- --  PHOS -- --    Basename 02/28/11 0625  AST 14  ALT 13  ALKPHOS 96  BILITOT 0.2*  PROT 6.7  ALBUMIN 3.5   No results found for this basename: LIPASE:2,AMYLASE:2 in the last 72 hours  Basename 02/28/11 0625 02/27/11 1315  WBC 6.9 7.2  NEUTROABS -- 5.3  HGB 11.9* 11.7*  HCT 34.6* 33.9*  MCV 92.0 91.4  PLT 273 294   No results found for this basename: CKTOTAL:3,CKMB:3,CKMBINDEX:3,TROPONINI:3 in the last 72 hours No results found for this basename: POCBNP:3 in the last 72 hours No results found for this basename: DDIMER:2 in the last 72 hours No results found for this basename: HGBA1C:2 in the last 72 hours No results found for this basename: CHOL:2,HDL:2,LDLCALC:2,TRIG:2,CHOLHDL:2,LDLDIRECT:2 in the last 72 hours No results found for this basename: TSH,T4TOTAL,FREET3,T3FREE,THYROIDAB in the last 72 hours No results found for this basename:  VITAMINB12:2,FOLATE:2,FERRITIN:2,TIBC:2,IRON:2,RETICCTPCT:2 in the last 72 hours  Micro Results: No results found for this or any previous visit (from the past 240 hour(s)).  Studies/Results: Dg Pelvis 1-2 Views  02/28/2011  *RADIOLOGY REPORT*  Clinical Data: Right hip pain.  PELVIS - 1-2 VIEW  Comparison: CT of the abdomen and pelvis performed 04/17/2006  Findings: There is no evidence of fracture or dislocation.  Both femoral heads are seated normally within their respective acetabula.  Mild degenerative change is noted along the right greater femoral trochanter.  There is mild left convex lumbar scoliosis.  The sacroiliac joints are unremarkable in appearance.  The visualized bowel gas pattern is grossly unremarkable in appearance.  IMPRESSION:  1.  No evidence of fracture or dislocation. 2.  Mild left convex lumbar scoliosis.  Original Report Authenticated By: Tonia Ghent, M.D.   Mr Lumbar Spine Wo Contrast  02/27/2011  *RADIOLOGY REPORT*  Clinical Data: Severe low back pain.  MRI LUMBAR SPINE WITHOUT CONTRAST  Technique:  Multiplanar and multiecho pulse sequences of the lumbar spine were obtained without intravenous contrast.  Comparison: Plain films from 02/23/2011 and 02/25/2011.  Findings: There is degenerative scoliosis convex left in the upper lumbar region.  Asymmetric loss of interspace height L2-3 on the right is present, along with approximately 1 cm translation  of L3 leftward on L4.  Asymmetric loss of interspace height on the left is seen at L4-L5. The L5-S1 disc space is diffusely narrowed.  The conus is normal.  The gallbladder is markedly distended.  The bladder is distended. Renal cystic disease is incompletely evaluated.  The appearance of the L3-4 disc space is abnormal, with hyperintensity of the disc as well as the endplates above and below.  There is slight depression of the superior endplate of L4 with questionable loss of cortical margin  centrally.  I do not see a paraspinous  or epidural soft tissue component. Concern is raised for infectious diskitis and osteomyelitis.  The individual disc spaces are examined as follows:  L1-2:  Mild bulge. No stenosis or disc protrusion.  L2-3:  Asymmetric disc space narrowing on the right without focal protrusion.  Mild central canal stenosis is present along with facet arthropathy.  Asymmetric foraminal narrowing on the delayed potentially compresses the right L2 nerve root.  L3-4:  Central protrusion.  Advanced posterior element hypertrophy. Severe multifactorial spinal stenosis, with left greater than right L4 nerve root encroachment.  Bilateral neural foraminal narrowing appears to affect the L3 nerve roots, slightly worse on the left.  L4-5:    Moderate central canal stenosis secondary to posterior element hypertrophy and central bulging annular fibers.  Left L5 nerve root encroachment is likely.  Left-sided neural foraminal narrowing due to bony overgrowth and disc material could affect the L4 nerve root.  L5-S1:   Severe disc space narrowing.  Mild to moderate facet arthropathy.  Central and leftward protrusion extends into the foramen.  Left L5 and left  S1 nerve root encroachment are likely.  IMPRESSION: Discal hyperintensity at L3-4 along with endplate edema and irregularity above and below is concerning for diskitis and adjacent osteomyelitis.  Correlate with clinical clinical findings and laboratory results.  Percutaneous aspiration may be necessary for further evaluation.  Multilevel spondylosis as described; potential exists for significant nerve root compression from L3-4 through L5-S1.  Original Report Authenticated By: Elsie Stain, M.D.   Ir Fluoro Guide Ndl Plmt / Bx  02/28/2011  *RADIOLOGY REPORT*  Clinical Data/Indication: L3-4 DISKITIS SUSPECTED  FLUORO GUIDED NEEDLE PLACEMENT  Sedation: Versed two mg, Fentanyl 100 mg.  Total Moderate Sedation Time: 15 minutes.  Fluoroscopy Time: One point of minutes.  Procedure: The  procedure, risks, benefits, and alternatives were explained to the patient. Questions regarding the procedure were encouraged and answered. The patient understands and consents to the procedure.  The back was prepped with betadine in a sterile fashion, and a sterile drape was applied covering the operative field. A sterile gown and sterile gloves were used for the procedure.  Under fluoroscopic guidance, an 18 gauge needle was inserted into the L3-4 disc.  Aspiration yielded a couple drops of bloody fluid. This was repeated and again bloody fluid was aspirated.  Findings: Imaging demonstrates needle placement in the mid nucleus pulposus of the L3-4 disc.  Complications: None.  IMPRESSION: Successful disc aspiration at L3-4.  Original Report Authenticated By: Donavan Burnet, M.D.    Medications: I have reviewed the patient's current medications.   Principal Problem:  *Diskitis Active Problems:  Hyponatremia  Normocytic anemia  HTN (hypertension)  Cataract  Hyperlipidemia    Assessment and plan: - resume antibiotics, as she had IR biopsy for cultures. Orhto on boards. Patient has remain afebrile, no increase in WBC. Continue narcotics for pain. -continue IV fluids, hyponatremia improving. Most likely 2/2 to decrease PO  intake 2/2 to pain.  -Normocytic anemia, needs follow up as out patient ferritin not valuable as patient has inflammatory reaction due to her diskitis.  -HTN improving with pain control. Continue to monitor. Cont home meds.  LOS: 1 day   Marinda Elk M.D. Pager: [phone] [facility] 02/28/2011, 4:58 PM

## 2011-02-28 NOTE — ED Notes (Signed)
Patient denies pain and is resting comfortably.  

## 2011-03-01 DIAGNOSIS — M519 Unspecified thoracic, thoracolumbar and lumbosacral intervertebral disc disorder: Secondary | ICD-10-CM

## 2011-03-01 DIAGNOSIS — R739 Hyperglycemia, unspecified: Secondary | ICD-10-CM | POA: Insufficient documentation

## 2011-03-01 DIAGNOSIS — J302 Other seasonal allergic rhinitis: Secondary | ICD-10-CM | POA: Insufficient documentation

## 2011-03-01 LAB — URINE CULTURE
Colony Count: NO GROWTH
Culture  Setup Time: 201212060052

## 2011-03-01 MED ORDER — METHYLPREDNISOLONE SODIUM SUCC 125 MG IJ SOLR
80.0000 mg | Freq: Two times a day (BID) | INTRAMUSCULAR | Status: DC
  Administered 2011-03-01 – 2011-03-03 (×4): 80 mg via INTRAVENOUS
  Filled 2011-03-01 (×6): qty 1.28

## 2011-03-01 MED ORDER — GLUCERNA SHAKE PO LIQD
237.0000 mL | Freq: Two times a day (BID) | ORAL | Status: DC
  Administered 2011-03-01 – 2011-03-02 (×2): 237 mL via ORAL
  Filled 2011-03-01 (×10): qty 237

## 2011-03-01 MED ORDER — VANCOMYCIN HCL IN DEXTROSE 1-5 GM/200ML-% IV SOLN
1000.0000 mg | INTRAVENOUS | Status: DC
  Filled 2011-03-01: qty 200

## 2011-03-01 NOTE — Consult Note (Signed)
Infectious Diseases Initial Consultation         Day 2 vancomycin Date of admission: 02/27/2011  Date of Consult:  03/01/2011  Reason for Consult: evaluate for the possibility of vertebral infection  Referring Physician: Dr. Rosine Beat    Problem List:  Principal Problem:  *Diskitis Active Problems:  HTN (hypertension)  Cataract  Hyperlipidemia  Hyponatremia  Normocytic anemia  Hyperglycemia  Seasonal allergies   Recommendations: 1. discontinue vancomycin and observe off of antibiotics for now pending lumbar aspirate cultures.  Assessment: It is not entirely clear what is causing her severe back pain but the gradual onset without a precipitating event, progressively severe nature the pain, and MRI scan findings are all compatible with early lumbar vertebral infection. Her normal sed rate and C-reactive protein would go against infection but certainly are not sensitive enough indicators to exclude the possibility of infection particularly during a very early stage. A Gram stain of the bloody lumbar fluid aspirated yesterday does not reveal any organisms and  cultures are negative so far. If the cultures turn positive we will have the working diagnosis and then can tailor antibiotic therapy based on the organism and susceptibilities. If the cultures remain negative and we have to entertain the possibility that they are falsely negative due to the one dose of vancomycin that she received before the aspirate. At this point I favor stopping the vancomycin and observing off antibiotics pending final cultures. If the cultures remain negative we may need to consider repeat aspirate unless she is clearly getting better.  HPI: Nancy Meyer is a 77 y.o. female  developed the gradual onset of low back pain about 2-1/2 weeks ago. She does not recall any injury precipitating the pain. The pain grew progressively severe to the point that she was unable to stand and walk. By the time of  admission on December 4 the pain was 10 out of 10. The pain would initially radiate down her left posterior thigh and then switched to her right posterior thigh. The pain was tolerable when she would lay flat on her back and be very still but was very severe when she tried to stand. She has not had any fever or chills. She would frequently break out into a sweat when she tried to walk but she attributed this to the severe pain. She's never had pain like this before.   ROS: She has not been eating or drinking as much recently because of the severe pain. She says slightly decreased urinary output but no problems starting her stream or dysuria. She's had no incontinence. She has not had any weakness or numbness in her extremities. She denies any weight loss.     Marland Kitchen buPROPion  150 mg Oral Daily  . calcium-vitamin D  1 tablet Oral Daily  . cholecalciferol  1,000 Units Oral Q0700  . docusate sodium  100 mg Oral BID  . feeding supplement  237 mL Oral BID BM  . fentaNYL  25 mcg Transdermal Q72H  . heparin  5,000 Units Subcutaneous Q8H  . methylPREDNISolone (SOLU-MEDROL) injection  80 mg Intravenous Q12H  . olmesartan  20 mg Oral Daily  . senna  1 tablet Oral BID  . vancomycin  1,000 mg Intravenous Once  . vancomycin  1,000 mg Intravenous Q24H    Past Medical History  Diagnosis Date  . Hypertension   . Osteoporosis   . Macular degeneration     History  Substance Use Topics  . Smoking status: Never  Smoker   . Smokeless tobacco: Never Used  . Alcohol Use: No    History reviewed. No pertinent family history. No Known Allergies  OBJECTIVE: Blood pressure 154/72, pulse 88, temperature 98.4 F (36.9 C), temperature source Oral, resp. rate 16, height 5\' 3"  (1.6 m), weight 48.988 kg (108 lb), SpO2 95.00%.  General: She is alert, comfortable and in no distress when laying flat in bed.  Skin: She has no rash, a splinter or conjunctival hemorrhages. Lungs: Clear Cor: Regular S1 and S2 no  murmurs Abdomen: Soft and nontender without any masses. Bowel sounds are normal. Neurologic: Sensation in her lower extremities is intact to light touch. Strength in her lower extremities appears to be normal but is somewhat limited by pain with movement. She has symmetrical and normal deep tendon reflexes in her knees.  Lab Results  Component Value Date   ESRSEDRATE 7 02/27/2011    Results for orders placed during the hospital encounter of 02/27/11 (from the past 48 hour(s))  C-REACTIVE PROTEIN     Status: Abnormal   Collection Time   02/27/11 10:50 PM      Component Value Range Comment   CRP 0.08 (*) <0.60 (mg/dL)   COMPREHENSIVE METABOLIC PANEL     Status: Abnormal   Collection Time   02/28/11  6:25 AM      Component Value Range Comment   Sodium 133 (*) 135 - 145 (mEq/L)    Potassium 3.5  3.5 - 5.1 (mEq/L)    Chloride 98  96 - 112 (mEq/L)    CO2 28  19 - 32 (mEq/L)    Glucose, Bld 125 (*) 70 - 99 (mg/dL)    BUN 11  6 - 23 (mg/dL)    Creatinine, Ser 0.45  0.50 - 1.10 (mg/dL)    Calcium 9.1  8.4 - 10.5 (mg/dL)    Total Protein 6.7  6.0 - 8.3 (g/dL)    Albumin 3.5  3.5 - 5.2 (g/dL)    AST 14  0 - 37 (U/L)    ALT 13  0 - 35 (U/L)    Alkaline Phosphatase 96  39 - 117 (U/L)    Total Bilirubin 0.2 (*) 0.3 - 1.2 (mg/dL)    GFR calc non Af Amer 81 (*) >90 (mL/min)    GFR calc Af Amer >90  >90 (mL/min)   CBC     Status: Abnormal   Collection Time   02/28/11  6:25 AM      Component Value Range Comment   WBC 6.9  4.0 - 10.5 (K/uL)    RBC 3.76 (*) 3.87 - 5.11 (MIL/uL)    Hemoglobin 11.9 (*) 12.0 - 15.0 (g/dL)    HCT 40.9 (*) 81.1 - 46.0 (%)    MCV 92.0  78.0 - 100.0 (fL)    MCH 31.6  26.0 - 34.0 (pg)    MCHC 34.4  30.0 - 36.0 (g/dL)    RDW 91.4  78.2 - 95.6 (%)    Platelets 273  150 - 400 (K/uL)   PROTIME-INR     Status: Normal   Collection Time   02/28/11 11:27 AM      Component Value Range Comment   Prothrombin Time 13.3  11.6 - 15.2 (seconds)    INR 0.99  0.00 - 1.49    BODY  FLUID CULTURE     Status: Normal (Preliminary result)   Collection Time   02/28/11 12:45 PM      Component Value Range Comment  Specimen Description BONE L3 L4 DISK      Special Requests Normal      Gram Stain        Value: FEW WBC PRESENT, PREDOMINANTLY PMN     NO ORGANISMS SEEN   Culture NO GROWTH      Report Status PENDING     ANAEROBIC CULTURE     Status: Normal (Preliminary result)   Collection Time   02/28/11 12:45 PM      Component Value Range Comment   Specimen Description BONE L3 L4 DISK      Special Requests NONE      Gram Stain        Value: FEW WBC PRESENT, PREDOMINANTLY PMN     NO ORGANISMS SEEN   Culture        Value: NO ANAEROBES ISOLATED; CULTURE IN PROGRESS FOR 5 DAYS   Report Status PENDING         Component Value Date/Time   SDES BONE L3 L4 DISK 02/28/2011 1245   SDES BONE L3 L4 DISK 02/28/2011 1245   SPECREQUEST Normal 02/28/2011 1245   SPECREQUEST NONE 02/28/2011 1245   CULT NO GROWTH 02/28/2011 1245   CULT NO ANAEROBES ISOLATED; CULTURE IN PROGRESS FOR 5 DAYS 02/28/2011 1245   REPTSTATUS PENDING 02/28/2011 1245   REPTSTATUS PENDING 02/28/2011 1245   Dg Pelvis 1-2 Views  02/28/2011  *RADIOLOGY REPORT*  Clinical Data: Right hip pain.  PELVIS - 1-2 VIEW  Comparison: CT of the abdomen and pelvis performed 04/17/2006  Findings: There is no evidence of fracture or dislocation.  Both femoral heads are seated normally within their respective acetabula.  Mild degenerative change is noted along the right greater femoral trochanter.  There is mild left convex lumbar scoliosis.  The sacroiliac joints are unremarkable in appearance.  The visualized bowel gas pattern is grossly unremarkable in appearance.  IMPRESSION:  1.  No evidence of fracture or dislocation. 2.  Mild left convex lumbar scoliosis.  Original Report Authenticated By: Tonia Ghent, M.D.   Ir Fluoro Guide Ndl Plmt / Bx  02/28/2011  *RADIOLOGY REPORT*  Clinical Data/Indication: L3-4 DISKITIS SUSPECTED  FLUORO  GUIDED NEEDLE PLACEMENT  Sedation: Versed two mg, Fentanyl 100 mg.  Total Moderate Sedation Time: 15 minutes.  Fluoroscopy Time: One point of minutes.  Procedure: The procedure, risks, benefits, and alternatives were explained to the patient. Questions regarding the procedure were encouraged and answered. The patient understands and consents to the procedure.  The back was prepped with betadine in a sterile fashion, and a sterile drape was applied covering the operative field. A sterile gown and sterile gloves were used for the procedure.  Under fluoroscopic guidance, an 18 gauge needle was inserted into the L3-4 disc.  Aspiration yielded a couple drops of bloody fluid. This was repeated and again bloody fluid was aspirated.  Findings: Imaging demonstrates needle placement in the mid nucleus pulposus of the L3-4 disc.  Complications: None.  IMPRESSION: Successful disc aspiration at L3-4.  Original Report Authenticated By: Donavan Burnet, M.D.    Amrit Erck [facility] [phone] 03/01/2011, 3:57 PM

## 2011-03-01 NOTE — Progress Notes (Addendum)
Subjective: The patient has been off antibiotics for 24 hours. And on steroids. She relates her back pain is much better. She was even able to the bathroom and had a bowel movement. She relates she still has the pain but not quite as significant as before. Objective: Filed Vitals:   02/28/11 1709 02/28/11 1833 02/28/11 2023 03/01/11 0343  BP: 155/69 133/81 167/79 126/62  Pulse: 98 100 103 97  Temp:  97.8 F (36.6 C) 97.3 F (36.3 C) 98.2 F (36.8 C)  TempSrc:  Oral Oral Oral  Resp:  20 18 17   Height:      Weight:      SpO2: 98% 95% 95% 94%   Weight change:   Intake/Output Summary (Last 24 hours) at 03/01/11 1328 Last data filed at 03/01/11 0700  Gross per 24 hour  Intake    800 ml  Output    200 ml  Net    600 ml    General: Alert, awake, oriented x3, in no acute distress.  HEENT: No bruits, no goiter.  Heart: Regular rate and rhythm, without murmurs, rubs, gallops.  Lungs: good air movement, CTA B/l Abdomen: Soft, nontender, nondistended, positive bowel sounds.  Neuro: Grossly intact, nonfocal. No weakness, sensation intact.   Lab Results:  [facility] 02/28/11 0625 02/27/11 1315  NA 133* 132*  K 3.5 3.9  CL 98 95*  CO2 28 27  GLUCOSE 125* 118*  BUN 11 15  CREATININE 0.55 0.58  CALCIUM 9.1 9.8  MG -- --  PHOS -- --    Basename 02/28/11 0625  AST 14  ALT 13  ALKPHOS 96  BILITOT 0.2*  PROT 6.7  ALBUMIN 3.5   No results found for this basename: LIPASE:2,AMYLASE:2 in the last 72 hours  Basename 02/28/11 0625 02/27/11 1315  WBC 6.9 7.2  NEUTROABS -- 5.3  HGB 11.9* 11.7*  HCT 34.6* 33.9*  MCV 92.0 91.4  PLT 273 294   No results found for this basename: CKTOTAL:3,CKMB:3,CKMBINDEX:3,TROPONINI:3 in the last 72 hours No results found for this basename: POCBNP:3 in the last 72 hours No results found for this basename: DDIMER:2 in the last 72 hours No results found for this basename: HGBA1C:2 in the last 72 hours No results found for this basename:  CHOL:2,HDL:2,LDLCALC:2,TRIG:2,CHOLHDL:2,LDLDIRECT:2 in the last 72 hours No results found for this basename: TSH,T4TOTAL,FREET3,T3FREE,THYROIDAB in the last 72 hours No results found for this basename: VITAMINB12:2,FOLATE:2,FERRITIN:2,TIBC:2,IRON:2,RETICCTPCT:2 in the last 72 hours  Micro Results: Recent Results (from the past 240 hour(s))  BODY FLUID CULTURE     Status: Normal (Preliminary result)   Collection Time   02/28/11 12:45 PM      Component Value Range Status Comment   Specimen Description BONE L3 L4 DISK   Final    Special Requests Normal   Final    Gram Stain     Final    Value: FEW WBC PRESENT, PREDOMINANTLY PMN     NO ORGANISMS SEEN   Culture PENDING   Incomplete    Report Status PENDING   Incomplete   ANAEROBIC CULTURE     Status: Normal (Preliminary result)   Collection Time   02/28/11 12:45 PM      Component Value Range Status Comment   Specimen Description BONE L3 L4 DISK   Final    Special Requests NONE   Final    Gram Stain     Final    Value: FEW WBC PRESENT, PREDOMINANTLY PMN     NO ORGANISMS SEEN  Culture     Final    Value: NO ANAEROBES ISOLATED; CULTURE IN PROGRESS FOR 5 DAYS   Report Status PENDING   Incomplete     Studies/Results: Dg Pelvis 1-2 Views  02/28/2011  *RADIOLOGY REPORT*  Clinical Data: Right hip pain.  PELVIS - 1-2 VIEW  Comparison: CT of the abdomen and pelvis performed 04/17/2006  Findings: There is no evidence of fracture or dislocation.  Both femoral heads are seated normally within their respective acetabula.  Mild degenerative change is noted along the right greater femoral trochanter.  There is mild left convex lumbar scoliosis.  The sacroiliac joints are unremarkable in appearance.  The visualized bowel gas pattern is grossly unremarkable in appearance.  IMPRESSION:  1.  No evidence of fracture or dislocation. 2.  Mild left convex lumbar scoliosis.  Original Report Authenticated By: Tonia Ghent, M.D.   Mr Lumbar Spine Wo  Contrast  02/27/2011  *RADIOLOGY REPORT*  Clinical Data: Severe low back pain.  MRI LUMBAR SPINE WITHOUT CONTRAST  Technique:  Multiplanar and multiecho pulse sequences of the lumbar spine were obtained without intravenous contrast.  Comparison: Plain films from 02/23/2011 and 02/25/2011.  Findings: There is degenerative scoliosis convex left in the upper lumbar region.  Asymmetric loss of interspace height L2-3 on the right is present, along with approximately 1 cm translation of L3 leftward on L4.  Asymmetric loss of interspace height on the left is seen at L4-L5. The L5-S1 disc space is diffusely narrowed.  The conus is normal.  The gallbladder is markedly distended.  The bladder is distended. Renal cystic disease is incompletely evaluated.  The appearance of the L3-4 disc space is abnormal, with hyperintensity of the disc as well as the endplates above and below.  There is slight depression of the superior endplate of L4 with questionable loss of cortical margin  centrally.  I do not see a paraspinous or epidural soft tissue component. Concern is raised for infectious diskitis and osteomyelitis.  The individual disc spaces are examined as follows:  L1-2:  Mild bulge. No stenosis or disc protrusion.  L2-3:  Asymmetric disc space narrowing on the right without focal protrusion.  Mild central canal stenosis is present along with facet arthropathy.  Asymmetric foraminal narrowing on the delayed potentially compresses the right L2 nerve root.  L3-4:  Central protrusion.  Advanced posterior element hypertrophy. Severe multifactorial spinal stenosis, with left greater than right L4 nerve root encroachment.  Bilateral neural foraminal narrowing appears to affect the L3 nerve roots, slightly worse on the left.  L4-5:    Moderate central canal stenosis secondary to posterior element hypertrophy and central bulging annular fibers.  Left L5 nerve root encroachment is likely.  Left-sided neural foraminal narrowing due to  bony overgrowth and disc material could affect the L4 nerve root.  L5-S1:   Severe disc space narrowing.  Mild to moderate facet arthropathy.  Central and leftward protrusion extends into the foramen.  Left L5 and left  S1 nerve root encroachment are likely.  IMPRESSION: Discal hyperintensity at L3-4 along with endplate edema and irregularity above and below is concerning for diskitis and adjacent osteomyelitis.  Correlate with clinical clinical findings and laboratory results.  Percutaneous aspiration may be necessary for further evaluation.  Multilevel spondylosis as described; potential exists for significant nerve root compression from L3-4 through L5-S1.  Original Report Authenticated By: Elsie Stain, M.D.   Ir Fluoro Guide Ndl Plmt / Bx  02/28/2011  *RADIOLOGY REPORT*  Clinical Data/Indication: L3-4  DISKITIS SUSPECTED  FLUORO GUIDED NEEDLE PLACEMENT  Sedation: Versed two mg, Fentanyl 100 mg.  Total Moderate Sedation Time: 15 minutes.  Fluoroscopy Time: One point of minutes.  Procedure: The procedure, risks, benefits, and alternatives were explained to the patient. Questions regarding the procedure were encouraged and answered. The patient understands and consents to the procedure.  The back was prepped with betadine in a sterile fashion, and a sterile drape was applied covering the operative field. A sterile gown and sterile gloves were used for the procedure.  Under fluoroscopic guidance, an 18 gauge needle was inserted into the L3-4 disc.  Aspiration yielded a couple drops of bloody fluid. This was repeated and again bloody fluid was aspirated.  Findings: Imaging demonstrates needle placement in the mid nucleus pulposus of the L3-4 disc.  Complications: None.  IMPRESSION: Successful disc aspiration at L3-4.  Original Report Authenticated By: Donavan Burnet, M.D.    Medications: I have reviewed the patient's current medications.   Principal Problem:  *Diskitis Active Problems:  Hyponatremia   Normocytic anemia  HTN (hypertension)  Cataract  Hyperlipidemia    Assessment and plan: - Off antibiotics for 24 hours. Steroids were started 24 hours ago she has not spiked a fever. She relates she feels much better. Appreciate infectious disease consult and orthopedic input. We'll get physical therapy to see the patient. And evaluate for possible skilled nursing facility. Aspirate cultures continue to be negative.   -HTN improving with pain control. Continue to monitor. Cont home meds.   LOS: 2 days   Marinda Elk M.D. Pager: [phone] [facility] 03/01/2011, 1:28 PM

## 2011-03-01 NOTE — Progress Notes (Signed)
Subjective:    pt continues with r. Leg pain .  Pt with disc aspirate no organisms seen and culture pending. Patient reports pain as continued at prev levels.    Objective: Vital signs in last 24 hours: Temp:  [97.3 F (36.3 C)-98.2 F (36.8 C)] 98.2 F (36.8 C) (12/06 0343) Pulse Rate:  [92-106] 97  (12/06 0343) Resp:  [5-20] 17  (12/06 0343) BP: (126-169)/(62-86) 126/62 mmHg (12/06 0343) SpO2:  [94 %-100 %] 94 % (12/06 0343)  Intake/Output from previous day: 12/05 0701 - 12/06 0700 In: 800 [I.V.:800] Out: 200 [Urine:200] Intake/Output this shift:     Basename 02/28/11 0625 02/27/11 1315  HGB 11.9* 11.7*    Basename 02/28/11 0625 02/27/11 1315  WBC 6.9 7.2  RBC 3.76* 3.71*  HCT 34.6* 33.9*  PLT 273 294    Basename 02/28/11 0625 02/27/11 1315  NA 133* 132*  K 3.5 3.9  CL 98 95*  CO2 28 27  BUN 11 15  CREATININE 0.55 0.58  GLUCOSE 125* 118*  CALCIUM 9.1 9.8    Basename 02/28/11 1127  LABPT --  INR 0.99    Neurologically intact ABD soft Neurovascular intact Sensation intact distally Intact pulses distally Compartment soft wdwn female nad eyes not dilated not using accessory muscles respiration no rashes. A@o  *3 Assessment/Plan:    77 yo female continued r. Leg pain mild back pain.  I CONTINUE TO BELIEVE THAT THIS IS LIKELY INFLAMMATION OF NERVE ROOTS AND LOWER DOWN IS INFECTION IN DISC SPACE.  I WOULD STRONGLY RECOMMEND IV STEROIDS IF NOT CONTRA-INDICATED. THIS I BELIEVE WILL RELIEVE SOME OF THIS R. LEG PAIN.  Aspirate will likely be negative and repeat MRI in 3 weeeks will be best way to tell if this is an infection.  We are probably obligated to treat as disc infection but feel treating as inflammation related to compression and stenosis is higher likely hood.  Consult from Nuerosurgery may be indicated to make sure they feel no surgical indication exists at this point. Sahiti Joswick--[phone]  Ariaunna Longsworth L 03/01/2011, 7:49 AM

## 2011-03-01 NOTE — Progress Notes (Signed)
INITIAL ADULT NUTRITION ASSESSMENT Date: 03/01/2011   Time: 11:02 AM Reason for Assessment: Poor intake  ASSESSMENT: Female 77 y.o.  Dx: Diskitis  Hx:  Past Medical History  Diagnosis Date  . Hypertension   . Osteoporosis   . Macular degeneration    Related Meds:  Scheduled Meds:   . buPROPion  150 mg Oral Daily  . calcium-vitamin D  1 tablet Oral Daily  . cholecalciferol  1,000 Units Oral Q0700  . docusate sodium  100 mg Oral BID  . fentaNYL  25 mcg Transdermal Q72H  . heparin  5,000 Units Subcutaneous Q8H  . olmesartan  20 mg Oral Daily  . senna  1 tablet Oral BID  . vancomycin  1,000 mg Intravenous Once  . DISCONTD: vancomycin  1,000 mg Intravenous Q24H   Continuous Infusions:   . sodium chloride 75 mL/hr at 03/01/11 0700   PRN Meds:.acetaminophen, fentaNYL, HYDROcodone-acetaminophen, midazolam, morphine, ondansetron (ZOFRAN) IV, DISCONTD: morphine  Ht: 5\' 3"  (160 cm)  Wt: 108 lb (48.988 kg)  Ideal Wt: 52.3kg % Ideal Wt: 93  Usual Wt: 49kg % Usual Wt: 99  Body mass index is 19.13 kg/(m^2).  Food/Nutrition Related Hx: Pt reports poor intake for a few days PTA r/t pain. Pt denies any problems swallowing, but c/o dry mouth. Pt denies any changes in weight.   Labs:  CMP     Component Value Date/Time   NA 133* 02/28/2011 0625   K 3.5 02/28/2011 0625   CL 98 02/28/2011 0625   CO2 28 02/28/2011 0625   GLUCOSE 125* 02/28/2011 0625   BUN 11 02/28/2011 0625   CREATININE 0.55 02/28/2011 0625   CALCIUM 9.1 02/28/2011 0625   PROT 6.7 02/28/2011 0625   ALBUMIN 3.5 02/28/2011 0625   AST 14 02/28/2011 0625   ALT 13 02/28/2011 0625   ALKPHOS 96 02/28/2011 0625   BILITOT 0.2* 02/28/2011 0625   GFRNONAA 81* 02/28/2011 0625   GFRAA >90 02/28/2011 0625    Intake/Output Summary (Last 24 hours) at 03/01/11 1105 Last data filed at 03/01/11 0700  Gross per 24 hour  Intake    800 ml  Output    200 ml  Net    600 ml    Diet Order: Carb Control  IVF:    sodium chloride Last  Rate: 75 mL/hr at 03/01/11 0700    Estimated Nutritional Needs:   Kcal:1500-1700 Protein:60-75g Fluid:1.5-1.7L  NUTRITION DIAGNOSIS: -Inadequate oral intake (NI-2.1).  Status: Ongoing  RELATED TO: pain  AS EVIDENCE BY: pt statement, <25% meal intake for the past few days  MONITORING/EVALUATION(Goals): Pt to consume >75 of meals/supplements.   EDUCATION NEEDS: -No education needs identified at this time  INTERVENTION: Glucerna shake BID. Intake will likely improve as pain improves. Assisted pt with ordering lunch. Will monitor.   Dietitian # [phone]  DOCUMENTATION CODES Per approved criteria  -Not Applicable    Marshall Cork 03/01/2011, 11:02 AM

## 2011-03-02 DIAGNOSIS — M519 Unspecified thoracic, thoracolumbar and lumbosacral intervertebral disc disorder: Secondary | ICD-10-CM

## 2011-03-02 NOTE — Progress Notes (Signed)
Patient ID: Nancy Meyer, female   DOB: 10-Sep-1920, 77 y.o.   MRN: [id number] INFECTIOUS DISEASE PROGRESS NOTE   Date of Admission:  02/27/2011   Off antibiotics for the past 24 hours     . buPROPion  150 mg Oral Daily  . calcium-vitamin D  1 tablet Oral Daily  . cholecalciferol  1,000 Units Oral Q0700  . docusate sodium  100 mg Oral BID  . feeding supplement  237 mL Oral BID BM  . fentaNYL  25 mcg Transdermal Q72H  . heparin  5,000 Units Subcutaneous Q8H  . methylPREDNISolone (SOLU-MEDROL) injection  80 mg Intravenous Q12H  . olmesartan  20 mg Oral Daily  . senna  1 tablet Oral BID  . DISCONTD: vancomycin  1,000 mg Intravenous Q24H    Subjective: She is feeling much better today. She has been up walking to the bathroom and is doing a little bit of walking in her room. She estimates her pain has only been 4/10. Objective: Temp (24hrs), Avg:97.9 F (36.6 C), Min:97.4 F (36.3 C), Max:98.4 F (36.9 C)    General: She is comfortable in bed visiting with her son Lungs: Clear Cor: Regular S1-S2 no murmurs Abdomen: Soft and nontender   Lab Results Lab Results  Component Value Date   WBC 6.9 02/28/2011   HGB 11.9* 02/28/2011   HCT 34.6* 02/28/2011   MCV 92.0 02/28/2011   PLT 273 02/28/2011    Lab Results  Component Value Date   CREATININE 0.55 02/28/2011   BUN 11 02/28/2011   NA 133* 02/28/2011   K 3.5 02/28/2011   CL 98 02/28/2011   CO2 28 02/28/2011    Lab Results  Component Value Date   ALT 13 02/28/2011   AST 14 02/28/2011   ALKPHOS 96 02/28/2011   BILITOT 0.2* 02/28/2011     Microbiology: Recent Results (from the past 240 hour(s))  URINE CULTURE     Status: Normal   Collection Time   02/28/11 12:00 PM      Component Value Range Status Comment   Specimen Description URINE, RANDOM   Final    Special Requests NONE   Final    Setup Time [id number]   Final    Colony Count NO GROWTH   Final    Culture NO GROWTH   Final    Report Status 03/01/2011 FINAL   Final     BODY FLUID CULTURE     Status: Normal (Preliminary result)   Collection Time   02/28/11 12:45 PM      Component Value Range Status Comment   Specimen Description BONE L3 L4 DISK   Final    Special Requests Normal   Final    Gram Stain     Final    Value: FEW WBC PRESENT, PREDOMINANTLY PMN     NO ORGANISMS SEEN   Culture NO GROWTH 1 DAY   Final    Report Status PENDING   Incomplete   ANAEROBIC CULTURE     Status: Normal (Preliminary result)   Collection Time   02/28/11 12:45 PM      Component Value Range Status Comment   Specimen Description BONE L3 L4 DISK   Final    Special Requests NONE   Final    Gram Stain     Final    Value: FEW WBC PRESENT, PREDOMINANTLY PMN     NO ORGANISMS SEEN   Culture     Final    Value: NO ANAEROBES ISOLATED;  CULTURE IN PROGRESS FOR 5 DAYS   Report Status PENDING   Incomplete     Studies/Results: Ir Fluoro Guide Ndl Plmt / Bx  02/28/2011  *RADIOLOGY REPORT*  Clinical Data/Indication: L3-4 DISKITIS SUSPECTED  FLUORO GUIDED NEEDLE PLACEMENT  Sedation: Versed two mg, Fentanyl 100 mg.  Total Moderate Sedation Time: 15 minutes.  Fluoroscopy Time: One point of minutes.  Procedure: The procedure, risks, benefits, and alternatives were explained to the patient. Questions regarding the procedure were encouraged and answered. The patient understands and consents to the procedure.  The back was prepped with betadine in a sterile fashion, and a sterile drape was applied covering the operative field. A sterile gown and sterile gloves were used for the procedure.  Under fluoroscopic guidance, an 18 gauge needle was inserted into the L3-4 disc.  Aspiration yielded a couple drops of bloody fluid. This was repeated and again bloody fluid was aspirated.  Findings: Imaging demonstrates needle placement in the mid nucleus pulposus of the L3-4 disc.  Complications: None.  IMPRESSION: Successful disc aspiration at L3-4.  Original Report Authenticated By: Donavan Burnet, M.D.      Assessment: She is improving but I am uncertain if this is due to a nonspecific affect of her steroids. Her lumbar aspirate cultures remained negative but they were obtained after one dose of vancomycin which could render them falsely negative. At this point I will continue observation off of antibiotics. If the cultures remain negative then she can be discharged home when she is ambulating without significant pain. If her pain were to worsen or recur then I would consider a repeat MRI and lumbar aspirate before antibiotics are considered.  Plan: 1. Continue observation off of antibiotics pending lumbar aspirate cultures.   Derryl Uher [facility] Infectious Diseases [phone] 03/02/2011, 2:52 PM

## 2011-03-02 NOTE — Progress Notes (Signed)
Physical Therapy Evaluation Patient Details Name: Nancy Meyer MRN: [id number] DOB: [birth year] Today's Date: 03/02/2011  Problem List:  Patient Active Problem List  Diagnoses  . Osteoporosis  . HTN (hypertension)  . Diskitis  . Cataract  . Hyperlipidemia  . Hyponatremia  . Normocytic anemia  . Hyperglycemia  . Seasonal allergies    Past Medical History:  Past Medical History  Diagnosis Date  . Hypertension   . Osteoporosis   . Macular degeneration    Past Surgical History:  Past Surgical History  Procedure Date  . Appendectomy   . Eye surgery     PT Assessment/Plan/Recommendation PT Assessment Clinical Impression Statement: 77 y.o. female admitted to [facility] with sudden onset back pain and diagnoses with discitis and possible osteomyesitis.  She underwent a disc aspiration of L3-4 and is awaiting biopsy results.  The patient presents with decreased right leg strength, increased right hip, leg and buttocks pain down past her knee, decreased balance, mobility, decreased independence and safety.  She would benefit from skilled PT to maximize independence, fucntional mobility and safety prior to discharge where she would be best served going to SNF for rehab for a short period of time for continued therapy.   PT Recommendation/Assessment: Patient will need skilled PT in the acute care venue PT Problem List: Decreased strength;Decreased activity tolerance;Decreased balance;Decreased mobility;Decreased knowledge of use of DME;Pain Barriers to Discharge: Decreased caregiver support PT Therapy Diagnosis : Difficulty walking;Abnormality of gait;Generalized weakness;Acute pain PT Plan PT Frequency: Min 3X/week PT Treatment/Interventions: DME instruction;Gait training;Functional mobility training;Therapeutic activities;Therapeutic exercise;Balance training;Neuromuscular re-education;Patient/family education PT Recommendation Recommendations for Other Services: OT consult Follow Up  Recommendations: Skilled nursing facility;24 hour supervision/assistance (patient requests [facility]) Equipment Recommended: Defer to next venue PT Goals  Acute Rehab PT Goals PT Goal Formulation: With patient Time For Goal Achievement: 2 weeks Pt will Roll Supine to Right Side: Independently PT Goal: Rolling Supine to Right Side - Progress: Progressing toward goal Pt will Roll Supine to Left Side: Independently PT Goal: Rolling Supine to Left Side - Progress: Not met Pt will go Supine/Side to Sit: Independently PT Goal: Supine/Side to Sit - Progress: Progressing toward goal Pt will go Sit to Supine/Side: Independently PT Goal: Sit to Supine/Side - Progress: Not met Pt will go Sit to Stand: with modified independence PT Goal: Sit to Stand - Progress: Progressing toward goal Pt will go Stand to Sit: with modified independence PT Goal: Stand to Sit - Progress: Progressing toward goal Pt will Transfer Bed to Chair/Chair to Bed: with modified independence PT Transfer Goal: Bed to Chair/Chair to Bed - Progress: Not met Pt will Ambulate: >150 feet;with modified independence;with least restrictive assistive device PT Goal: Ambulate - Progress: Progressing toward goal  PT Evaluation Precautions/Restrictions    Prior Functioning  Home Living Lives With: Alone Receives Help From: Other (Comment) (none for 24 hours) Type of Home: House Home Layout: One level Home Access: Level entry Bathroom Shower/Tub: Tub/shower unit;Walk-in shower;Door;Curtain Bathroom Toilet: Standard Home Adaptive Equipment: None Prior Function Level of Independence: Independent with basic ADLs;Independent with homemaking with ambulation;Independent with gait;Independent with transfers Able to Take Stairs?: Yes Driving: Yes (doesn't drive at night or anywhere unfamiliar) Vocation: Other (comment) (retired Futures trader) Leisure: Hobbies-yes (Comment) (church, reading mysteries) Comments: eats out lunch every day "I  just don't want to cook for myself" Cognition Cognition Orientation Level: Oriented X4 Sensation/Coordination Sensation Light Touch: Appears Intact Extremity Assessment RLE Assessment RLE Assessment: Exceptions to [facility] RLE Strength RLE Overall Strength: Deficits RLE  Overall Strength Comments: 4/5 LLE Assessment LLE Assessment: Within Functional Limits Mobility (including Balance) Bed Mobility Bed Mobility: Yes Rolling Right: 6: Modified independent (Device/Increase time);With rail (verbal cues for log roll technique for comfort) Right Sidelying to Sit: 6: Modified independent (Device/Increase time);With rails;HOB flat Sitting - Scoot to Edge of Bed: 6: Modified independent (Device/Increase time);With rail Transfers Transfers: Yes Sit to Stand: 4: Min assist;From bed;From toilet;With upper extremity assist Sit to Stand Details (indicate cue type and reason): min assist to boost trunk up over painful right hip.  Needing help from low surfaces.   Stand to Sit: 4: Min assist;With upper extremity assist;To chair/3-in-1;To toilet Stand to Sit Details: min assist to help control descent to sit.   Ambulation/Gait Ambulation/Gait: Yes Ambulation/Gait Assistance: 4: Min assist Ambulation/Gait Assistance Details (indicate cue type and reason): min assist to help steady trunk secondary to decreasd balance and decreased familiarity of adaptive equipment (RW).  Patient has a very flexed posture (? premorbid vs. secondary to pain).  Limited gait distance secondary to increased right hip pain with walking.   Ambulation Distance (Feet): 20 Feet (10' x2) Assistive device: Rolling walker Gait Pattern: Shuffle;Trunk flexed  Posture/Postural Control Posture/Postural Control: Postural limitations Postural Limitations: very flexed and kyphotic posture during gait.   Exercise    End of Session PT - End of Session Equipment Utilized During Treatment: Gait belt (RW) Activity Tolerance: Patient limited  by pain Patient left: in chair;with call bell in reach General Behavior During Session: [facility] for tasks performed Cognition: [facility] for tasks performed  Krystalyn Kubota B. Nazim Kadlec, PT, DPT [phone]   03/02/2011, 5:17 PM

## 2011-03-03 MED ORDER — PREDNISONE 20 MG PO TABS
40.0000 mg | ORAL_TABLET | Freq: Every day | ORAL | Status: DC
  Administered 2011-03-04: 40 mg via ORAL
  Filled 2011-03-03: qty 2

## 2011-03-03 NOTE — Progress Notes (Signed)
Patient ID: Nancy Meyer, female   DOB: 1920-04-05, 77 y.o.   MRN: [id number] INFECTIOUS DISEASE PROGRESS NOTE   Date of Admission:  02/27/2011      . buPROPion  150 mg Oral Daily  . calcium-vitamin D  1 tablet Oral Daily  . cholecalciferol  1,000 Units Oral Q0700  . docusate sodium  100 mg Oral BID  . feeding supplement  237 mL Oral BID BM  . fentaNYL  25 mcg Transdermal Q72H  . heparin  5,000 Units Subcutaneous Q8H  . olmesartan  20 mg Oral Daily  . predniSONE  40 mg Oral Q breakfast  . senna  1 tablet Oral BID  . DISCONTD: methylPREDNISolone (SOLU-MEDROL) injection  80 mg Intravenous Q12H    Subjective: She is feeling much better and is having only minimal pain. She has walked around the unit twice already this morning. She is not requiring narcotic pain medication.  Objective: Temp (24hrs), Avg:98.1 F (36.7 C), Min:97.9 F (36.6 C), Max:98.4 F (36.9 C)    General: Alert and in no distress Lungs: Clear Cor: Regular S1 and S2 no murmurs  Lab Results Lab Results  Component Value Date   WBC 6.9 02/28/2011   HGB 11.9* 02/28/2011   HCT 34.6* 02/28/2011   MCV 92.0 02/28/2011   PLT 273 02/28/2011    Lab Results  Component Value Date   CREATININE 0.55 02/28/2011   BUN 11 02/28/2011   NA 133* 02/28/2011   K 3.5 02/28/2011   CL 98 02/28/2011   CO2 28 02/28/2011    Lab Results  Component Value Date   ALT 13 02/28/2011   AST 14 02/28/2011   ALKPHOS 96 02/28/2011   BILITOT 0.2* 02/28/2011     Microbiology: Recent Results (from the past 240 hour(s))  URINE CULTURE     Status: Normal   Collection Time   02/28/11 12:00 PM      Component Value Range Status Comment   Specimen Description URINE, RANDOM   Final    Special Requests NONE   Final    Setup Time [id number]   Final    Colony Count NO GROWTH   Final    Culture NO GROWTH   Final    Report Status 03/01/2011 FINAL   Final   BODY FLUID CULTURE     Status: Normal (Preliminary result)   Collection Time   02/28/11  12:45 PM      Component Value Range Status Comment   Specimen Description BONE L3 L4 DISK   Final    Special Requests Normal   Final    Gram Stain     Final    Value: FEW WBC PRESENT, PREDOMINANTLY PMN     NO ORGANISMS SEEN   Culture NO GROWTH 1 DAY   Final    Report Status PENDING   Incomplete   ANAEROBIC CULTURE     Status: Normal (Preliminary result)   Collection Time   02/28/11 12:45 PM      Component Value Range Status Comment   Specimen Description BONE L3 L4 DISK   Final    Special Requests NONE   Final    Gram Stain     Final    Value: FEW WBC PRESENT, PREDOMINANTLY PMN     NO ORGANISMS SEEN   Culture     Final    Value: NO ANAEROBES ISOLATED; CULTURE IN PROGRESS FOR 5 DAYS   Report Status PENDING   Incomplete     Studies/Results:  No results found.   Assessment: She is improving on steroids. Her lumbar aspirate culture remains negative so I will continue observation off of antibiotics. I cannot completely rule out the possibility of lumbar discitis but would simply continue steroid taper for now. If her pain were to recur following the completion of this and I would certainly repeat her MRI scan and consider re\re aspiration off of antibiotics.  Plan: 1. Continue observation off of antibiotics. 2. I will followup on December 10. Please call if needed sooner.   Donnamarie Shankles [facility] for Infectious Diseases [phone] 03/03/2011, 11:22 AM

## 2011-03-03 NOTE — Progress Notes (Signed)
Physical Therapy Treatment Patient Details Name: Nancy Meyer MRN: [id number] DOB: [birth year] Today's Date: 03/03/2011 15:20-15:45, gt, te PT Assessment/Plan  PT - Assessment/Plan Comments on Treatment Session: Pt reports she feels much better today.  Pt encouraged to use RW for all gait.  (Pt ambulating without RW to BR and c/o R LE pain with gait).  Pt is making progress, but requires cues for safety with mobility and transfers. PT Plan: Discharge plan remains appropriate;Frequency remains appropriate PT Frequency: Min 3X/week Follow Up Recommendations: Skilled nursing facility;24 hour supervision/assistance (requesting [facility]) Equipment Recommended: Defer to next venue PT Goals  Acute Rehab PT Goals PT Goal: Supine/Side to Sit - Progress: Progressing toward goal PT Goal: Sit to Supine/Side - Progress:  (not addressed) PT Goal: Sit to Stand - Progress: Progressing toward goal PT Goal: Stand to Sit - Progress: Progressing toward goal PT Transfer Goal: Bed to Chair/Chair to Bed - Progress: Progressing toward goal PT Goal: Ambulate - Progress: Progressing toward goal  PT Treatment Precautions/Restrictions  Restrictions Weight Bearing Restrictions: No Mobility (including Balance) Bed Mobility Bed Mobility: Yes Supine to Sit: 6: Modified independent (Device/Increase time);HOB elevated (Comment degrees) (45 degrees) Sitting - Scoot to Edge of Bed: 6: Modified independent (Device/Increase time);With rail Transfers Sit to Stand: 5: Supervision;From toilet;From bed;With armrests;With upper extremity assist (pt requiring 75% verbal cueing for safety with hands) Sit to Stand Details (indicate cue type and reason): pt pulling on RW, requiring cues Stand to Sit: 5: Supervision;To toilet;To chair/3-in-1 Stand to Sit Details: cues for hand placement Ambulation/Gait Ambulation/Gait Assistance: 4: Min assist;Other (comment) (min/guard) Ambulation/Gait Assistance Details (indicate  cue type and reason): flexed trunk which pt and family reports is new.  Cues on how to use RW to unweight R LE for pain management. Ambulation Distance (Feet): 70 Feet (x 2 reps) Assistive device: Rolling walker Gait Pattern: Trunk flexed (slight antalgic)  Posture/Postural Control Postural Limitations: very flexed and kyphotic posture during gait.   Exercise  General Exercises - Lower Extremity Ankle Circles/Pumps: AROM;Both;10 reps Long Arc Quad: AROM;Strengthening;Both;5 reps;Seated Hip ABduction/ADduction: AROM;Strengthening;Seated Hip Flexion/Marching: AROM;Strengthening;10 reps;Seated End of Session PT - End of Session Equipment Utilized During Treatment: Gait belt Activity Tolerance: Patient tolerated treatment well Patient left: in chair;with call bell in reach;with family/visitor present General Behavior During Session: [facility] for tasks performed Cognition: [facility] for tasks performed  [facility] LUBECK 03/03/2011, 3:58 PM

## 2011-03-03 NOTE — Progress Notes (Signed)
Subjective: The patient has been off antibiotics for 24 hours. And on steroids. She relates her back pain is much better. She's able ambulate  With a walker. Requiring minimal narcotic use.  Objective: Filed Vitals:   03/02/11 0630 03/02/11 1405 03/02/11 2205 03/03/11 0530  BP: 146/72 148/73 138/78 146/82  Pulse: 100 110 92 94  Temp: 97.4 F (36.3 C) 98.4 F (36.9 C) 97.9 F (36.6 C) 98.1 F (36.7 C)  TempSrc: Oral Oral Oral Oral  Resp: 18 18 18 16   Height:      Weight:      SpO2: 97% 94% 93% 95%   Weight change:   Intake/Output Summary (Last 24 hours) at 03/03/11 1014 Last data filed at 03/03/11 0530  Gross per 24 hour  Intake    240 ml  Output      0 ml  Net    240 ml    General: Alert, awake, oriented x3, in no acute distress.  HEENT: No bruits, no goiter.  Heart: Regular rate and rhythm, without murmurs, rubs, gallops.  Lungs: good air movement, CTA B/l Abdomen: Soft, nontender, nondistended, positive bowel sounds.  Neuro: Grossly intact, nonfocal. No weakness, sensation intact.   Lab Results  Micro Results: Recent Results (from the past 240 hour(s))  URINE CULTURE     Status: Normal   Collection Time   02/28/11 12:00 PM      Component Value Range Status Comment   Specimen Description URINE, RANDOM   Final    Special Requests NONE   Final    Setup Time [id number]   Final    Colony Count NO GROWTH   Final    Culture NO GROWTH   Final    Report Status 03/01/2011 FINAL   Final   BODY FLUID CULTURE     Status: Normal (Preliminary result)   Collection Time   02/28/11 12:45 PM      Component Value Range Status Comment   Specimen Description BONE L3 L4 DISK   Final    Special Requests Normal   Final    Gram Stain     Final    Value: FEW WBC PRESENT, PREDOMINANTLY PMN     NO ORGANISMS SEEN   Culture NO GROWTH 1 DAY   Final    Report Status PENDING   Incomplete   ANAEROBIC CULTURE     Status: Normal (Preliminary result)   Collection Time   02/28/11 12:45 PM        Component Value Range Status Comment   Specimen Description BONE L3 L4 DISK   Final    Special Requests NONE   Final    Gram Stain     Final    Value: FEW WBC PRESENT, PREDOMINANTLY PMN     NO ORGANISMS SEEN   Culture     Final    Value: NO ANAEROBES ISOLATED; CULTURE IN PROGRESS FOR 5 DAYS   Report Status PENDING   Incomplete     Studies/Results: No results found.  Medications: I have reviewed the patient's current medications.   Principal Problem:  *Diskitis Active Problems:  Hyponatremia  Normocytic anemia  HTN (hypertension)  Cataract  Hyperlipidemia  Hyperglycemia  Seasonal allergies    Assessment and plan: - Off antibiotics for 48 hours. Change steroids to by mouth. Physical therapy evaluated the patient recommended skilled nursing facility [facility].  -HTN improving with pain control. Continue to monitor. Cont home meds.   LOS: 4 days  Marinda Elk M.D. Pager: [phone] [facility] 03/03/2011, 10:14 AM

## 2011-03-03 NOTE — Progress Notes (Signed)
Subjective: Pt much better on steroid medication   Objective: Vital signs in last 24 hours: Temp:  [97.9 F (36.6 C)-98.4 F (36.9 C)] 98.1 F (36.7 C) (12/08 0530) Pulse Rate:  [92-110] 94  (12/08 0530) Resp:  [16-18] 16  (12/08 0530) BP: (138-148)/(73-82) 146/82 mmHg (12/08 0530) SpO2:  [93 %-95 %] 95 % (12/08 0530)  Intake/Output from previous day: 12/07 0701 - 12/08 0700 In: 480 [P.O.:480] Out: -  Intake/Output this shift:    No results found for this basename: HGB:5 in the last 72 hours No results found for this basename: WBC:2,RBC:2,HCT:2,PLT:2 in the last 72 hours No results found for this basename: NA:2,K:2,CL:2,CO2:2,BUN:2,CREATININE:2,GLUCOSE:2,CALCIUM:2 in the last 72 hours  Basename 02/28/11 1127  LABPT --  INR 0.99    Neurologically intact Neurovascular intact Sensation intact distally Intact pulses distally Dorsiflexion/Plantar flexion intact wdwn female nad eyes not dilated not using accessory muscles respiration alert and oriented   Assessment/Plan: Pt improved on steroids.  I agree with Dr Orvan Falconer that this is likely not disc space infectiobn and i would start on PO steroid taper and follow as outpatient. i would re scan in 3 weeks to eval disc space as destruction would cont if this is infection. Consider SNF vs IP rehab   Nancy Meyer L 03/03/2011, 8:03 AM

## 2011-03-04 LAB — BODY FLUID CULTURE: Special Requests: NORMAL

## 2011-03-04 MED ORDER — PREDNISONE 50 MG PO TABS
50.0000 mg | ORAL_TABLET | Freq: Every day | ORAL | Status: DC
  Administered 2011-03-05: 50 mg via ORAL
  Filled 2011-03-04 (×2): qty 1

## 2011-03-04 MED ORDER — METHYLPREDNISOLONE SODIUM SUCC 40 MG IJ SOLR
INTRAMUSCULAR | Status: AC
  Filled 2011-03-04: qty 2

## 2011-03-04 MED ORDER — METHYLPREDNISOLONE SODIUM SUCC 125 MG IJ SOLR
60.0000 mg | Freq: Once | INTRAMUSCULAR | Status: AC
  Administered 2011-03-04: 60 mg via INTRAVENOUS

## 2011-03-04 NOTE — Progress Notes (Signed)
Met with patient who is agreeable to SNF placement at d/c. CSW Psychosocial Assessment and FL2 placed in shadow chart in [facility]. MD to please review and sign FL2. Will proceed with SNF search and advise. Reece Levy, MSW, Theresia Majors [phone]

## 2011-03-04 NOTE — Progress Notes (Signed)
Subjective: The patient has been off antibiotics for 24 hours. And on steroids. She relates her back pain got worse today. She's able ambulate  With a walker. Requiring narcotic use.  Objective: Filed Vitals:   03/03/11 0530 03/03/11 1418 03/03/11 1942 03/04/11 0425  BP: 146/82 143/79 158/73 154/74  Pulse: 94 104 106 96  Temp: 98.1 F (36.7 C) 98.3 F (36.8 C) 97.5 F (36.4 C) 97.5 F (36.4 C)  TempSrc: Oral  Oral Axillary  Resp: 16 16 20 19   Height:      Weight:      SpO2: 95% 95% 99% 99%   Weight change:   Intake/Output Summary (Last 24 hours) at 03/04/11 1034 Last data filed at 03/03/11 1419  Gross per 24 hour  Intake    480 ml  Output      0 ml  Net    480 ml    General: Alert, awake, oriented x3, in no acute distress.  HEENT: No bruits, no goiter.  Heart: Regular rate and rhythm, without murmurs, rubs, gallops.  Lungs: good air movement, CTA B/l Abdomen: Soft, nontender, nondistended, positive bowel sounds.  Neuro: Grossly intact, nonfocal. No weakness, sensation intact.   Lab Results  Micro Results: Recent Results (from the past 240 hour(s))  URINE CULTURE     Status: Normal   Collection Time   02/28/11 12:00 PM      Component Value Range Status Comment   Specimen Description URINE, RANDOM   Final    Special Requests NONE   Final    Setup Time [id number]   Final    Colony Count NO GROWTH   Final    Culture NO GROWTH   Final    Report Status 03/01/2011 FINAL   Final   BODY FLUID CULTURE     Status: Normal (Preliminary result)   Collection Time   02/28/11 12:45 PM      Component Value Range Status Comment   Specimen Description BONE L3 L4 DISK   Final    Special Requests Normal   Final    Gram Stain     Final    Value: FEW WBC PRESENT, PREDOMINANTLY PMN     NO ORGANISMS SEEN   Culture NO GROWTH 2 DAYS   Final    Report Status PENDING   Incomplete   ANAEROBIC CULTURE     Status: Normal (Preliminary result)   Collection Time   02/28/11 12:45 PM   Component Value Range Status Comment   Specimen Description BONE L3 L4 DISK   Final    Special Requests NONE   Final    Gram Stain     Final    Value: FEW WBC PRESENT, PREDOMINANTLY PMN     NO ORGANISMS SEEN   Culture     Final    Value: NO ANAEROBES ISOLATED; CULTURE IN PROGRESS FOR 5 DAYS   Report Status PENDING   Incomplete     Studies/Results: No results found.  Medications: I have reviewed the patient's current medications.   Principal Problem:  *Diskitis Active Problems:  Hyponatremia  Normocytic anemia  HTN (hypertension)  Cataract  Hyperlipidemia  Hyperglycemia  Seasonal allergies    Assessment and plan: - Give an extra dose of Solu-Medrol. Change steroids to by mouth. Physical therapy evaluated the patient recommended skilled nursing facility [facility] Awaiting placement.  -HTN improving with pain control. Continue to monitor. Cont home meds.   LOS: 5 days   FELIZ ORTIZ,  Jaquelinne Glendening M.D. Pager: [phone] [facility] 03/04/2011, 10:34 AM

## 2011-03-04 NOTE — Progress Notes (Signed)
Subjective: Slight setback w/ increasing pain compared to yesterday, but overall continues better   Objective: Vital signs in last 24 hours: Temp:  [97.5 F (36.4 C)-98.3 F (36.8 C)] 97.5 F (36.4 C) (12/09 0425) Pulse Rate:  [96-106] 96  (12/09 0425) Resp:  [16-20] 19  (12/09 0425) BP: (143-158)/(73-79) 154/74 mmHg (12/09 0425) SpO2:  [95 %-99 %] 99 % (12/09 0425)  Intake/Output from previous day: 12/08 0701 - 12/09 0700 In: 480 [P.O.:480] Out: -  Intake/Output this shift:    No results found for this basename: HGB:5 in the last 72 hours No results found for this basename: WBC:2,RBC:2,HCT:2,PLT:2 in the last 72 hours No results found for this basename: NA:2,K:2,CL:2,CO2:2,BUN:2,CREATININE:2,GLUCOSE:2,CALCIUM:2 in the last 72 hours No results found for this basename: LABPT:2,INR:2 in the last 72 hours  Neurologically intact ABD soft Neurovascular intact Sensation intact distally Intact pulses distally Dorsiflexion/Plantar flexion intact No cellulitis present Compartment soft wdwn female nad  eyes not dilated. not using accessory muscles of respiration. no rashes on exposed skin  Assessment/Plan: 77 yo female continues to improve on steroids for r. Leg pain Agree with SNF transfer when stable and f/u [facility]10 days   Emlyn Maves L 03/04/2011, 11:51 AM

## 2011-03-05 LAB — ANAEROBIC CULTURE

## 2011-03-05 MED ORDER — DSS 100 MG PO CAPS: 100.0000 mg | ORAL_CAPSULE | Freq: Two times a day (BID) | ORAL | Status: AC

## 2011-03-05 MED ORDER — HYDROCODONE-ACETAMINOPHEN 5-500 MG PO TABS: 1.0000 | ORAL_TABLET | ORAL | Status: DC | PRN

## 2011-03-05 MED ORDER — PREDNISONE 50 MG PO TABS: ORAL_TABLET | ORAL | Status: AC

## 2011-03-05 NOTE — Discharge Summary (Signed)
Admit date: 02/27/2011 Discharge date: 03/05/2011  Primary Care Physician:  Pearson Grippe, MD, MD   Discharge Diagnoses:   No resolved problems to display.  Active Hospital Problems  Diagnoses Date Noted   . Diskitis 02/27/2011     Priority: Medium  . Hyponatremia 02/28/2011     Priority: Low  . Normocytic anemia 02/28/2011     Priority: Low  . HTN (hypertension) 02/27/2011   . Hyperlipidemia 02/27/2011     Resolved Hospital Problems  Diagnoses Date Noted Date Resolved     DISCHARGE MEDICATION: Current Discharge Medication List    START taking these medications   Details  docusate sodium 100 MG CAPS Take 100 mg by mouth 2 (two) times daily. Qty: 10 capsule, Refills: 0    fentaNYL (DURAGESIC - DOSED MCG/HR) 25 MCG/HR Place 1 patch (25 mcg total) onto the skin every 3 (three) days. Qty: 5 patch, Refills: 0    predniSONE (DELTASONE) 50 MG tablet Continue prednisone 50 mg by mouth daily for 4 days then decrease by 10 mg daily, then stop      CONTINUE these medications which have CHANGED   Details  HYDROcodone-acetaminophen (VICODIN) 5-500 MG per tablet Take 1 tablet by mouth every 4 (four) hours as needed. Pain Qty: 30 tablet, Refills: 0      CONTINUE these medications which have NOT CHANGED   Details  acetaminophen (TYLENOL) 500 MG tablet Take 500 mg by mouth every 6 (six) hours as needed. Pain       buPROPion (WELLBUTRIN SR) 150 MG 12 hr tablet Take 150 mg by mouth daily.     calcium citrate-vitamin D 200-200 MG-UNIT TABS Take 1 tablet by mouth daily.     cholecalciferol (VITAMIN D) 1000 UNITS tablet Take 1,000 Units by mouth every morning.     diazepam (VALIUM) 2 MG tablet Take 2 mg by mouth every 8 (eight) hours as needed. MUSCLE SPASMS     ibuprofen (ADVIL,MOTRIN) 200 MG tablet Take 200 mg by mouth every 6 (six) hours as needed. PAIN    olmesartan (BENICAR) 20 MG tablet Take 20 mg by mouth daily.     traMADol (ULTRAM) 50 MG tablet Take 50 mg by mouth every 6  (six) hours as needed. For back pain Maximum dose= 8 tablets per day PAIN      STOP taking these medications     Multiple Vitamins-Minerals (ICAPS AREDS FORMULA PO)            Consults: Treatment Team:  [facility]   SIGNIFICANT DIAGNOSTIC STUDIES:  Dg Lumbar Spine 2-3 Views  02/25/2011  *RADIOLOGY REPORT*  Clinical Data: Lower back pain, radiating down both legs.  LUMBAR SPINE - 2-3 VIEW  Comparison: Lumbar spine radiographs performed 02/23/2011  Findings: There is no evidence of acute fracture or subluxation. There is mild left lateral listhesis of L3 on L4.  Multilevel disc space narrowing is noted along the lumbar spine, with associated vacuum phenomenon and endplate sclerotic change.  The appearance is stable from the recent prior study.  The visualized bowel gas pattern is unremarkable in appearance; air and stool are noted within the colon.  The sacroiliac joints are within normal limits.  IMPRESSION:  1.  No evidence of acute fracture or subluxation along the lumbar spine. 2.  Stable degenerative changes noted along the lumbar spine, with mild left lateral listhesis of L3 on L4.  Original Report Authenticated By: Tonia Ghent, M.D.   Dg Pelvis 1-2 Views  02/28/2011  *RADIOLOGY REPORT*  Clinical Data: Right hip pain.  PELVIS - 1-2 VIEW  Comparison: CT of the abdomen and pelvis performed 04/17/2006  Findings: There is no evidence of fracture or dislocation.  Both femoral heads are seated normally within their respective acetabula.  Mild degenerative change is noted along the right greater femoral trochanter.  There is mild left convex lumbar scoliosis.  The sacroiliac joints are unremarkable in appearance.  The visualized bowel gas pattern is grossly unremarkable in appearance.  IMPRESSION:  1.  No evidence of fracture or dislocation. 2.  Mild left convex lumbar scoliosis.  Original Report Authenticated By: Tonia Ghent, M.D.   Mr Lumbar Spine Wo Contrast  02/27/2011  *RADIOLOGY  REPORT*  Clinical Data: Severe low back pain.  MRI LUMBAR SPINE WITHOUT CONTRAST  Technique:  Multiplanar and multiecho pulse sequences of the lumbar spine were obtained without intravenous contrast.  Comparison: Plain films from 02/23/2011 and 02/25/2011.  Findings: There is degenerative scoliosis convex left in the upper lumbar region.  Asymmetric loss of interspace height L2-3 on the right is present, along with approximately 1 cm translation of L3 leftward on L4.  Asymmetric loss of interspace height on the left is seen at L4-L5. The L5-S1 disc space is diffusely narrowed.  The conus is normal.  The gallbladder is markedly distended.  The bladder is distended. Renal cystic disease is incompletely evaluated.  The appearance of the L3-4 disc space is abnormal, with hyperintensity of the disc as well as the endplates above and below.  There is slight depression of the superior endplate of L4 with questionable loss of cortical margin  centrally.  I do not see a paraspinous or epidural soft tissue component. Concern is raised for infectious diskitis and osteomyelitis.  The individual disc spaces are examined as follows:  L1-2:  Mild bulge. No stenosis or disc protrusion.  L2-3:  Asymmetric disc space narrowing on the right without focal protrusion.  Mild central canal stenosis is present along with facet arthropathy.  Asymmetric foraminal narrowing on the delayed potentially compresses the right L2 nerve root.  L3-4:  Central protrusion.  Advanced posterior element hypertrophy. Severe multifactorial spinal stenosis, with left greater than right L4 nerve root encroachment.  Bilateral neural foraminal narrowing appears to affect the L3 nerve roots, slightly worse on the left.  L4-5:    Moderate central canal stenosis secondary to posterior element hypertrophy and central bulging annular fibers.  Left L5 nerve root encroachment is likely.  Left-sided neural foraminal narrowing due to bony overgrowth and disc material  could affect the L4 nerve root.  L5-S1:   Severe disc space narrowing.  Mild to moderate facet arthropathy.  Central and leftward protrusion extends into the foramen.  Left L5 and left  S1 nerve root encroachment are likely.  IMPRESSION: Discal hyperintensity at L3-4 along with endplate edema and irregularity above and below is concerning for diskitis and adjacent osteomyelitis.  Correlate with clinical clinical findings and laboratory results.  Percutaneous aspiration may be necessary for further evaluation.  Multilevel spondylosis as described; potential exists for significant nerve root compression from L3-4 through L5-S1.  Original Report Authenticated By: Elsie Stain, M.D.   Ir Fluoro Guide Ndl Plmt / Bx  02/28/2011  *RADIOLOGY REPORT*  Clinical Data/Indication: L3-4 DISKITIS SUSPECTED  FLUORO GUIDED NEEDLE PLACEMENT  Sedation: Versed two mg, Fentanyl 100 mg.  Total Moderate Sedation Time: 15 minutes.  Fluoroscopy Time: One point of minutes.  Procedure: The procedure, risks, benefits, and alternatives were explained to the patient.  Questions regarding the procedure were encouraged and answered. The patient understands and consents to the procedure.  The back was prepped with betadine in a sterile fashion, and a sterile drape was applied covering the operative field. A sterile gown and sterile gloves were used for the procedure.  Under fluoroscopic guidance, an 18 gauge needle was inserted into the L3-4 disc.  Aspiration yielded a couple drops of bloody fluid. This was repeated and again bloody fluid was aspirated.  Findings: Imaging demonstrates needle placement in the mid nucleus pulposus of the L3-4 disc.  Complications: None.  IMPRESSION: Successful disc aspiration at L3-4.  Original Report Authenticated By: Donavan Burnet, M.D.    Recent Results (from the past 240 hour(s))  URINE CULTURE     Status: Normal   Collection Time   02/28/11 12:00 PM      Component Value Range Status Comment    Specimen Description URINE, RANDOM   Final    Special Requests NONE   Final    Setup Time [id number]   Final    Colony Count NO GROWTH   Final    Culture NO GROWTH   Final    Report Status 03/01/2011 FINAL   Final   BODY FLUID CULTURE     Status: Normal   Collection Time   02/28/11 12:45 PM      Component Value Range Status Comment   Specimen Description BONE L3 L4 DISK   Final    Special Requests Normal   Final    Gram Stain     Final    Value: FEW WBC PRESENT, PREDOMINANTLY PMN     NO ORGANISMS SEEN   Culture NO GROWTH 3 DAYS   Final    Report Status 03/04/2011 FINAL   Final   ANAEROBIC CULTURE     Status: Normal (Preliminary result)   Collection Time   02/28/11 12:45 PM      Component Value Range Status Comment   Specimen Description BONE L3 L4 DISK   Final    Special Requests NONE   Final    Gram Stain     Final    Value: FEW WBC PRESENT, PREDOMINANTLY PMN     NO ORGANISMS SEEN   Culture     Final    Value: NO ANAEROBES ISOLATED; CULTURE IN PROGRESS FOR 5 DAYS   Report Status PENDING   Incomplete     BRIEF ADMITTING H & P: This is an 76 year old female, she was on her usual state of health prior to 2 weeks ago, she developed sudden onset lower back pain mainly affecting her right lower extremities, pain is sharp, 10 out of 10 radiating to both buttocks and proximal posterior thigh, the pain is worse with ambulation, better with rest and while sitting, she denies any numbness or weakness of her extremities, she has to see Dr. Selena Batten where an x-ray done, which did show degenerative change, patient plays on multiple pain medications including Vicodin, ibuprofen, tramadol without response, and today presented for the second time to the emergency room where patient underwent an MRI of the lumbar spine which did show possibility of discitis and osteomyelitis, case discussed with Dr. Marikay Alar the neurosurgery where he reviewed the MRI and he admitted that if ESR and C-reactive protein  negative consider discharge the patient maintained at the lower back below the lumbar spine and it radiates to her right lower extremity pain. Condition did not associated with loss of bowel habit, or control, denies  any loss of weight, denies any burning micturition, denies any fever.She complain of urinary incontinence. She denies any difficulty with working but admitted she did it is so painful to walk   No resolved problems to display.  Active Hospital Problems  Diagnoses Date Noted   . Diskitis 02/27/2011      patient was admitted to the floor was started on IV Vanco. It was discussed with orthopedic clinic. She was an MRI done aspiration of the disc L3-L4 and was sent for culture it was negative. She was started on high dose IV steroids. Her pain improved. She remained in the hospital for 72 hours with no fevers. And clinically improving with steroids. ID was consulted and agreed with current treatment. She will followup with Dr. Wallace Cullens as an outpatient. She was on prednisone 50mg  she will take this for 5 days, then she will decrease by 10 mg daily.  She will go to skilled nursing facility due to her deconditioning.   Marland Kitchen Hyponatremia 02/28/2011      her BUN was slightly up an admission. She was given IV fluids and does resolve.   . Normocytic anemia 02/28/2011      this she'll followup with her primary care Dr. as an outpatient. No anemia panel was ordered due to the inflammation that she was having. As her ferritin was not an accurate.  Marland Kitchen HTN (hypertension): Control no changes were made  02/27/2011     Resolved Hospital Problems  Diagnoses Date Noted Date Resolved     Disposition and Follow-up:  She will also followup with Dr. Luiz Blare in 10 days to see how she's doing from her back to Discharge Orders    Future Orders Please Complete By Expires   Diet - low sodium heart healthy      Increase activity slowly        Follow-up Information    Follow up with Pearson Grippe, MD in 2 days.    Contact information:   [address], [address] [facility] [place] [address] [phone]           DISCHARGE EXAM: See progress note  Blood pressure 156/79, pulse 84, temperature 97.9 F (36.6 C), temperature source Oral, resp. rate 17, height 5\' 3"  (1.6 m), weight 48.988 kg (108 lb), SpO2 99.00%.  No results found for this basename: NA:2,K:2,CL:2,CO2:2,GLUCOSE:2,BUN:2,CREATININE:2,CALCIUM:2,MG:2,PHOS:2 in the last 72 hours No results found for this basename: AST:2,ALT:2,ALKPHOS:2,BILITOT:2,PROT:2,ALBUMIN:2 in the last 72 hours No results found for this basename: LIPASE:2,AMYLASE:2 in the last 72 hours No results found for this basename: WBC:2,NEUTROABS:2,HGB:2,HCT:2,MCV:2,PLT:2 in the last 72 hours  Signed: Marinda Elk M.D. 03/05/2011, 8:18 AM

## 2011-03-05 NOTE — Progress Notes (Signed)
Subjective: Oon steroids. She relates her back pain got worse today. She's able ambulate  With a walker. Requiring narcotic use.   Objective: Filed Vitals:   03/04/11 0425 03/04/11 1401 03/04/11 2000 03/05/11 0638  BP: 154/74 168/74 166/81 156/79  Pulse: 96 98 97 84  Temp: 97.5 F (36.4 C) 97.5 F (36.4 C) 98 F (36.7 C) 97.9 F (36.6 C)  TempSrc: Axillary  Oral Oral  Resp: 19 16 18 17   Height:      Weight:      SpO2: 99% 94% 96% 99%   Weight change:   Intake/Output Summary (Last 24 hours) at 03/05/11 0806 Last data filed at 03/04/11 1402  Gross per 24 hour  Intake    480 ml  Output      0 ml  Net    480 ml    General: Alert, awake, oriented x3, in no acute distress.  HEENT: No bruits, no goiter.  Heart: Regular rate and rhythm, without murmurs, rubs, gallops.  Lungs: good air movement, CTA B/l Abdomen: Soft, nontender, nondistended, positive bowel sounds.  Neuro: Grossly intact, nonfocal. No weakness, sensation intact.   Lab Results  Micro Results: Recent Results (from the past 240 hour(s))  URINE CULTURE     Status: Normal   Collection Time   02/28/11 12:00 PM      Component Value Range Status Comment   Specimen Description URINE, RANDOM   Final    Special Requests NONE   Final    Setup Time [id number]   Final    Colony Count NO GROWTH   Final    Culture NO GROWTH   Final    Report Status 03/01/2011 FINAL   Final   BODY FLUID CULTURE     Status: Normal   Collection Time   02/28/11 12:45 PM      Component Value Range Status Comment   Specimen Description BONE L3 L4 DISK   Final    Special Requests Normal   Final    Gram Stain     Final    Value: FEW WBC PRESENT, PREDOMINANTLY PMN     NO ORGANISMS SEEN   Culture NO GROWTH 3 DAYS   Final    Report Status 03/04/2011 FINAL   Final   ANAEROBIC CULTURE     Status: Normal (Preliminary result)   Collection Time   02/28/11 12:45 PM      Component Value Range Status Comment   Specimen Description BONE L3 L4  DISK   Final    Special Requests NONE   Final    Gram Stain     Final    Value: FEW WBC PRESENT, PREDOMINANTLY PMN     NO ORGANISMS SEEN   Culture     Final    Value: NO ANAEROBES ISOLATED; CULTURE IN PROGRESS FOR 5 DAYS   Report Status PENDING   Incomplete     Studies/Results: No results found.  Medications: I have reviewed the patient's current medications.   Principal Problem:  *Diskitis Active Problems:  Hyponatremia  Normocytic anemia  HTN (hypertension)  Cataract  Hyperlipidemia  Hyperglycemia  Seasonal allergies    Assessment and plan: Change steroids to by mouth. Physical therapy evaluated the patient recommended skilled nursing facility [facility] Awaiting placement.  -HTN improving with pain control. Continue to monitor. Cont home meds.   LOS: 6 days   Marinda Elk M.D. Pager: [phone] [facility] 03/05/2011, 8:06 AM

## 2011-03-05 NOTE — Progress Notes (Signed)
Subjective: Better today w/ mild continued pain   Objective: Vital signs in last 24 hours: Temp:  [97.5 F (36.4 C)-98 F (36.7 C)] 97.9 F (36.6 C) (12/10 4098) Pulse Rate:  [84-98] 84  (12/10 0638) Resp:  [16-18] 17  (12/10 1191) BP: (156-168)/(74-81) 156/79 mmHg (12/10 0638) SpO2:  [94 %-99 %] 99 % (12/10 4782)  Intake/Output from previous day: 12/09 0701 - 12/10 0700 In: 480 [P.O.:480] Out: -  Intake/Output this shift:    No results found for this basename: HGB:5 in the last 72 hours No results found for this basename: WBC:2,RBC:2,HCT:2,PLT:2 in the last 72 hours No results found for this basename: NA:2,K:2,CL:2,CO2:2,BUN:2,CREATININE:2,GLUCOSE:2,CALCIUM:2 in the last 72 hours No results found for this basename: LABPT:2,INR:2 in the last 72 hours  Neurologically intact ABD soft Neurovascular intact Sensation intact distally Intact pulses distally Dorsiflexion/Plantar flexion intact No cellulitis present Compartment soft wdwn female nad Eyes not dilated Not using accessory muscles of respiration No rashes A@o  times 3  Assessment/Plan: Continue ambulation F/u [facility] days in office   Jeanene Mena L 03/05/2011, 8:01 AM

## 2011-03-05 NOTE — Progress Notes (Signed)
Patient discharged to [facility].  Patient's daughter and son-in-law at bedside and aware of discharge disposition.  Patient stable for discharge.  Transported via [place] [facility].

## 2011-03-06 NOTE — Progress Notes (Signed)
Patient discharged to [facility] today, Insurance authorization was obtained through [employer]. Patient completed admission paperwork in room. [facility] transported to facility.   Unice Bailey, LCSWA [phone]

## 2011-04-02 ENCOUNTER — Other Ambulatory Visit (HOSPITAL_COMMUNITY): Payer: Self-pay | Admitting: Orthopedic Surgery

## 2011-04-02 DIAGNOSIS — R29898 Other symptoms and signs involving the musculoskeletal system: Secondary | ICD-10-CM

## 2011-04-02 DIAGNOSIS — R52 Pain, unspecified: Secondary | ICD-10-CM

## 2011-04-04 ENCOUNTER — Other Ambulatory Visit (HOSPITAL_COMMUNITY): Payer: Self-pay | Admitting: Orthopedic Surgery

## 2011-04-04 ENCOUNTER — Ambulatory Visit (HOSPITAL_COMMUNITY)
Admission: RE | Admit: 2011-04-04 | Discharge: 2011-04-04 | Disposition: A | Discharge status: Home / Self Care | Payer: Medicare Other | Source: Ambulatory Visit | Attending: Orthopedic Surgery | Admitting: Orthopedic Surgery

## 2011-04-04 DIAGNOSIS — M545 Low back pain, unspecified: Secondary | ICD-10-CM | POA: Insufficient documentation

## 2011-04-04 DIAGNOSIS — M5137 Other intervertebral disc degeneration, lumbosacral region: Secondary | ICD-10-CM | POA: Insufficient documentation

## 2011-04-04 DIAGNOSIS — R52 Pain, unspecified: Secondary | ICD-10-CM

## 2011-04-04 DIAGNOSIS — M51379 Other intervertebral disc degeneration, lumbosacral region without mention of lumbar back pain or lower extremity pain: Secondary | ICD-10-CM | POA: Insufficient documentation

## 2011-04-04 DIAGNOSIS — R29898 Other symptoms and signs involving the musculoskeletal system: Secondary | ICD-10-CM | POA: Insufficient documentation

## 2011-04-04 DIAGNOSIS — M79609 Pain in unspecified limb: Secondary | ICD-10-CM | POA: Insufficient documentation

## 2011-04-04 DIAGNOSIS — M47817 Spondylosis without myelopathy or radiculopathy, lumbosacral region: Secondary | ICD-10-CM | POA: Insufficient documentation

## 2011-04-04 DIAGNOSIS — M5146 Schmorl's nodes, lumbar region: Secondary | ICD-10-CM | POA: Insufficient documentation

## 2011-04-04 DIAGNOSIS — M4120 Other idiopathic scoliosis, site unspecified: Secondary | ICD-10-CM | POA: Insufficient documentation

## 2011-04-04 MED ORDER — GADOBENATE DIMEGLUMINE 529 MG/ML IV SOLN
10.0000 mL | Freq: Once | INTRAVENOUS | Status: AC | PRN
  Administered 2011-04-04: 10 mL via INTRAVENOUS

## 2011-08-09 ENCOUNTER — Ambulatory Visit (INDEPENDENT_AMBULATORY_CARE_PROVIDER_SITE_OTHER): Payer: Medicare Other | Admitting: Surgery

## 2011-08-09 ENCOUNTER — Encounter (INDEPENDENT_AMBULATORY_CARE_PROVIDER_SITE_OTHER): Payer: Self-pay | Admitting: Surgery

## 2011-08-09 DIAGNOSIS — K4090 Unilateral inguinal hernia, without obstruction or gangrene, not specified as recurrent: Secondary | ICD-10-CM | POA: Insufficient documentation

## 2011-08-09 NOTE — Progress Notes (Signed)
Re:   Nancy Meyer DOB:   1920-04-03 MRN:   [id number]  ASSESSMENT AND PLAN: 1.  Small reducible right inguinal hernia  I discussed the indications and complications of hernia surgery with the patient.  I discussed both the laparoscopic and open approach to hernia repair..  The potential risks of hernia surgery include, but are not limited to, bleeding, infection, open surgery, nerve injury, and recurrence of the hernia.  I provided the patient literature about hernia surgery.  At her age and with a small hernia, I think it makes a lot of sense to just follow this hernia.  They know if the hernia gets bigger or causes pain, I would be happy to see her back.  Otherwise her follow up with me is PRN.  2.  Diskitis, inflamed back. 3.  Hypertension. 4.  Depression.  Chief Complaint  Patient presents with  . New Evaluation    New Pt. RIH   REFERRING PHYSICIAN: Pearson Grippe, MD, MD  HISTORY OF PRESENT ILLNESS: Nancy Meyer is a 78 y.o. (DOB: 02-Apr-1920)  white female whose primary care physician is Nancy Grippe, MD, MD and comes to me today for a right inguinal hernia.  She is accompanied by her daughter, Nancy Meyer.  (He daughter is married to Nancy Meyer, an ortho PA)  [name] noticed the hernia about one year ago.  It may have increased a little in size.  It does not cause pain and is easily reducible when she lays down.  She has had no prior abdominal surgery or hernia repair.  She has no history of stomach disease.  No history of liver disease.  No history of gall bladder disease.  No history of pancreas disease.  No history of colon disease.  She did have an appendectomy at age 24.    Past Medical History  Diagnosis Date  . Hypertension   . Osteoporosis   . Macular degeneration   . Arthritis   . Hyperlipidemia       Past Surgical History  Procedure Date  . Appendectomy   . Eye surgery       Current Outpatient Prescriptions  Medication Sig Dispense Refill  .  acetaminophen (TYLENOL) 500 MG tablet Take 500 mg by mouth every 6 (six) hours as needed. Pain         . aspirin 325 MG EC tablet Take 325 mg by mouth daily.      . bisacodyl (DULCOLAX) 5 MG EC tablet Take 5 mg by mouth daily as needed.      Marland Kitchen buPROPion (WELLBUTRIN SR) 150 MG 12 hr tablet Take 150 mg by mouth daily.       . calcium citrate-vitamin D 200-200 MG-UNIT TABS Take 1 tablet by mouth daily.       . cholecalciferol (VITAMIN D) 1000 UNITS tablet Take 1,000 Units by mouth every morning.       Marland Kitchen ibuprofen (ADVIL,MOTRIN) 200 MG tablet Take 200 mg by mouth every 6 (six) hours as needed. PAIN      . losartan (COZAAR) 100 MG tablet       . Multiple Vitamins-Minerals (PRESERVISION AREDS 2 PO) Take 2 tablets by mouth daily.         No Known Allergies  REVIEW OF SYSTEMS: Skin:  No history of rash.  No history of abnormal moles. Infection:  No history of MRSA. Neurologic:  No history of stroke.  No history of seizure.  No history of headaches. Cardiac:  Hypertension  x 5 years.  No history of seeing a cardiologist. Pulmonary:  Does not smoke cigarettes.  No asthma or bronchitis.  No OSA/CPAP.  Endocrine:  Has an abnormal glucose that Dr. Selena Meyer is evaluating. No thyroid disease. Gastrointestinal:  See HPI. Urologic:  No history of kidney stones.  No history of bladder infections. Musculoskeletal:  She was in the hospital in Dec. 2012 for inflamed nerve in her back.  She says it still hurts, but is more like arthritis. Hematologic:  No bleeding disorder.  No history of anemia.  Not anticoagulated. Psycho-social:  The patient is oriented.   The patient has no obvious psychologic or social impairment to understanding our conversation and plan.  SOCIAL and FAMILY HISTORY: Widowed 25 + years. Daughter, Nancy Meyer, is with patient. She has one son who lives near also.  PHYSICAL EXAM: BP 136/86  Pulse 100  Temp(Src) 98.7 F (37.1 C) (Temporal)  Resp 14  Ht 5\' 3"  (1.6 m)  Wt 97 lb 6.4 oz (44.18 kg)   BMI 17.25 kg/m2  General: Very thin WF who is alert and generally healthy appearing.  HEENT: Normal. Pupils equal.  Neck: Supple. No mass.  No thyroid mass. Lymph Nodes:  No supraclavicular or cervical nodes. Lungs: Clear to auscultation and symmetric breath sounds. Heart:  RRR. No murmur or rub. Abdomen: Soft. No tenderness. Normal bowel sounds.  No abdominal scars.  Small reducible right inguinal hernia. Examined both standing and supine.  She is very thin. Rectal: Not done. Extremities:  Good strength and ROM  in upper and lower extremities. Neurologic:  Grossly intact to motor and sensory function. Psychiatric: Has normal mood and affect. Behavior is normal.   DATA REVIEWED: Nancy Meyer notes.  Nancy Kin, MD,  [facility] [address]  [address]   [place], [place]    [address] Phone:  [phone] FAX:  [phone]

## 2011-08-13 ENCOUNTER — Encounter (INDEPENDENT_AMBULATORY_CARE_PROVIDER_SITE_OTHER): Payer: Self-pay

## 2013-11-02 ENCOUNTER — Emergency Department (HOSPITAL_COMMUNITY): Payer: Medicare Other

## 2013-11-02 ENCOUNTER — Encounter (HOSPITAL_COMMUNITY): Payer: Self-pay | Admitting: Emergency Medicine

## 2013-11-02 ENCOUNTER — Emergency Department (HOSPITAL_COMMUNITY)
Admission: EM | Admit: 2013-11-02 | Discharge: 2013-11-03 | Disposition: A | Discharge status: Home / Self Care | Payer: Medicare Other | Attending: Emergency Medicine | Admitting: Emergency Medicine

## 2013-11-02 DIAGNOSIS — S32040A Wedge compression fracture of fourth lumbar vertebra, initial encounter for closed fracture: Secondary | ICD-10-CM

## 2013-11-02 DIAGNOSIS — M545 Low back pain, unspecified: Secondary | ICD-10-CM | POA: Insufficient documentation

## 2013-11-02 DIAGNOSIS — Y939 Activity, unspecified: Secondary | ICD-10-CM | POA: Diagnosis not present

## 2013-11-02 DIAGNOSIS — Z8669 Personal history of other diseases of the nervous system and sense organs: Secondary | ICD-10-CM | POA: Insufficient documentation

## 2013-11-02 DIAGNOSIS — S32009A Unspecified fracture of unspecified lumbar vertebra, initial encounter for closed fracture: Secondary | ICD-10-CM | POA: Diagnosis not present

## 2013-11-02 DIAGNOSIS — Y929 Unspecified place or not applicable: Secondary | ICD-10-CM | POA: Insufficient documentation

## 2013-11-02 DIAGNOSIS — X58XXXA Exposure to other specified factors, initial encounter: Secondary | ICD-10-CM | POA: Insufficient documentation

## 2013-11-02 DIAGNOSIS — Z791 Long term (current) use of non-steroidal anti-inflammatories (NSAID): Secondary | ICD-10-CM | POA: Diagnosis not present

## 2013-11-02 DIAGNOSIS — I10 Essential (primary) hypertension: Secondary | ICD-10-CM | POA: Diagnosis not present

## 2013-11-02 DIAGNOSIS — E785 Hyperlipidemia, unspecified: Secondary | ICD-10-CM | POA: Insufficient documentation

## 2013-11-02 DIAGNOSIS — M129 Arthropathy, unspecified: Secondary | ICD-10-CM | POA: Insufficient documentation

## 2013-11-02 DIAGNOSIS — Z79899 Other long term (current) drug therapy: Secondary | ICD-10-CM | POA: Diagnosis not present

## 2013-11-02 LAB — URINALYSIS, ROUTINE W REFLEX MICROSCOPIC
Bilirubin Urine: NEGATIVE
Glucose, UA: NEGATIVE mg/dL
HGB URINE DIPSTICK: NEGATIVE
Ketones, ur: NEGATIVE mg/dL
Leukocytes, UA: NEGATIVE
NITRITE: NEGATIVE
PH: 7.5 (ref 5.0–8.0)
Protein, ur: NEGATIVE mg/dL
SPECIFIC GRAVITY, URINE: 1.01 (ref 1.005–1.030)
UROBILINOGEN UA: 0.2 mg/dL (ref 0.0–1.0)

## 2013-11-02 LAB — CBC WITH DIFFERENTIAL/PLATELET
BASOS PCT: 0 % (ref 0–1)
Basophils Absolute: 0 10*3/uL (ref 0.0–0.1)
Eosinophils Absolute: 0.1 10*3/uL (ref 0.0–0.7)
Eosinophils Relative: 1 % (ref 0–5)
HEMATOCRIT: 36.8 % (ref 36.0–46.0)
HEMOGLOBIN: 12.3 g/dL (ref 12.0–15.0)
LYMPHS ABS: 1.1 10*3/uL (ref 0.7–4.0)
LYMPHS PCT: 13 % (ref 12–46)
MCH: 31.8 pg (ref 26.0–34.0)
MCHC: 33.4 g/dL (ref 30.0–36.0)
MCV: 95.1 fL (ref 78.0–100.0)
MONO ABS: 0.7 10*3/uL (ref 0.1–1.0)
MONOS PCT: 9 % (ref 3–12)
NEUTROS ABS: 6.2 10*3/uL (ref 1.7–7.7)
NEUTROS PCT: 77 % (ref 43–77)
Platelets: 231 10*3/uL (ref 150–400)
RBC: 3.87 MIL/uL (ref 3.87–5.11)
RDW: 13 % (ref 11.5–15.5)
WBC: 8.1 10*3/uL (ref 4.0–10.5)

## 2013-11-02 MED ORDER — HYDROMORPHONE HCL PF 1 MG/ML IJ SOLN
0.5000 mg | INTRAMUSCULAR | Status: DC | PRN
  Administered 2013-11-02: 0.5 mg via INTRAVENOUS
  Filled 2013-11-02: qty 1

## 2013-11-02 NOTE — ED Notes (Signed)
Pt states that she has a 2 1/2 year hx of back pain and tonight it is worse. Takes percocet 3 x a day but it is not controlling it now. Pain also goes down L leg. Alert and oriented.

## 2013-11-02 NOTE — ED Provider Notes (Signed)
CSN: [id number]     Arrival date & time 11/02/13  2200 History   First MD Initiated Contact with Patient 11/02/13 2302     Chief Complaint  Patient presents with  . Back Pain     (Consider location/radiation/quality/duration/timing/severity/associated sxs/prior Treatment) Patient is a 80 y.o. female presenting with back pain.  Back Pain Location:  Lumbar spine Quality:  Aching Radiates to:  L thigh Pain severity:  Moderate Duration: years. Progression:  Worsening Chronicity:  Chronic Context: not recent injury   Relieved by:  Bed rest Worsened by:  Ambulation Ineffective treatments:  Narcotics Associated symptoms: no fever, no numbness and no weakness   Pt has noticed that the symptoms have gotten worse in the last few weeks.  She has not seen anyone for it.  She has seen her PCP and Dr Newell Coral in the past.  She was told that her spine was degenerating, ?spinal stenosis.  Past Medical History  Diagnosis Date  . Hypertension   . Osteoporosis   . Macular degeneration   . Arthritis   . Hyperlipidemia    Past Surgical History  Procedure Laterality Date  . Appendectomy    . Eye surgery     Family History  Problem Relation Age of Onset  . Cancer Daughter     breast   History  Substance Use Topics  . Smoking status: Never Smoker   . Smokeless tobacco: Never Used  . Alcohol Use: No   OB History   Grav Para Term Preterm Abortions TAB SAB Ect Mult Living   3 3 3             Review of Systems  Constitutional: Negative for fever.  Musculoskeletal: Positive for back pain.  Neurological: Negative for weakness and numbness.  All other systems reviewed and are negative.     Allergies  Review of patient's allergies indicates no known allergies.  Home Medications   Prior to Admission medications   Medication Sig Start Date End Date Taking? Authorizing Provider  bisacodyl (DULCOLAX) 5 MG EC tablet Take 5 mg by mouth daily as needed for mild constipation.    Yes  Historical Provider, MD  buPROPion (WELLBUTRIN SR) 150 MG 12 hr tablet Take 150 mg by mouth daily.    Yes Historical Provider, MD  calcium citrate-vitamin D 200-200 MG-UNIT TABS Take 1 tablet by mouth daily.    Yes Historical Provider, MD  cholecalciferol (VITAMIN D) 1000 UNITS tablet Take 1,000 Units by mouth every morning.    Yes Historical Provider, MD  losartan (COZAAR) 100 MG tablet Take 100 mg by mouth daily.  07/12/11  Yes Historical Provider, MD  magnesium hydroxide (PHILLIPS CHEWS) 311 MG CHEW chewable tablet Chew 622 mg by mouth every 4 (four) hours as needed (constipation).   Yes Historical Provider, MD  Multiple Vitamins-Minerals (PRESERVISION AREDS 2 PO) Take 2 tablets by mouth daily.   Yes Historical Provider, MD  naproxen sodium (ANAPROX) 220 MG tablet Take 440 mg by mouth 2 (two) times daily as needed (pain).   Yes Historical Provider, MD  oxyCODONE-acetaminophen (PERCOCET/ROXICET) 5-325 MG per tablet Take 1-2 tablets by mouth every 6 (six) hours as needed for severe pain. 11/03/13   Linwood Dibbles, MD   BP 150/84  Pulse 93  Temp(Src) 98.1 F (36.7 C) (Oral)  SpO2 99% Physical Exam  Nursing note and vitals reviewed. Constitutional: She appears well-developed and well-nourished. No distress.  HENT:  Head: Normocephalic and atraumatic.  Right Ear: External ear normal.  Left  Ear: External ear normal.  Eyes: Conjunctivae are normal. Right eye exhibits no discharge. Left eye exhibits no discharge. No scleral icterus.  Neck: Neck supple. No tracheal deviation present.  Cardiovascular: Normal rate, regular rhythm and intact distal pulses.   Pulmonary/Chest: Effort normal and breath sounds normal. No stridor. No respiratory distress. She has no wheezes. She has no rales.  Abdominal: Soft. Bowel sounds are normal. She exhibits no distension. There is no tenderness. There is no rebound and no guarding.  Musculoskeletal: She exhibits no edema.       Lumbar back: She exhibits tenderness and  bony tenderness. She exhibits no swelling, no edema and no deformity.  Neurological: She is alert. She has normal strength. No cranial nerve deficit (no facial droop, extraocular movements intact, no slurred speech) or sensory deficit. She exhibits normal muscle tone. She displays no seizure activity. Coordination normal.  Skin: Skin is warm and dry. No rash noted.  Psychiatric: She has a normal mood and affect.    ED Course  Procedures (including critical care time) Labs Review Labs Reviewed  BASIC METABOLIC PANEL - Abnormal; Notable for the following:    Sodium 131 (*)    Chloride 93 (*)    Glucose, Bld 123 (*)    GFR calc non Af Amer 74 (*)    GFR calc Af Amer 86 (*)    All other components within normal limits  CBC WITH DIFFERENTIAL  URINALYSIS, ROUTINE W REFLEX MICROSCOPIC    Imaging Review Dg Lumbar Spine Complete  11/03/2013   CLINICAL DATA:  Worsening back pain radiating to left leg.  EXAM: LUMBAR SPINE - COMPLETE 4+ VIEW  COMPARISON:  Lumbar spine radiographs Aug 20, 2012  FINDINGS: Moderate to severe L4 compression fracture, with progressed height loss. Remaining lumbar vertebra appear intact. Severe upper lumbar broad levoscoliosis. Maintenance of lumbar lordosis. Severe L2-3 through L5-S1 disc degeneration stent with multilevel severe facet arthropathy. Bone mineral density is decreased without destructive bony lesions. No definite pars interarticularis defects though scoliosis limits evaluation.  Moderate calcific atherosclerosis of the aorta.  IMPRESSION: Moderate-to-severe chronic L4 compression/insufficiency fracture, mild progressed height loss. No malalignment. Severe broad levoscoliosis, similar.  Osteopenia, which decreases sensitivity for acute nondisplaced fractures.   Electronically Signed   By: Awilda Metroourtnay  Bloomer   On: 11/03/2013 00:46   Medications  HYDROmorphone (DILAUDID) injection 0.5 mg (0.5 mg Intravenous Given 11/02/13 2340)     MDM   Final diagnoses:   Compression fracture of L4 lumbar vertebra, closed, initial encounter    Pt was able to walk  after pain medications in the ED.  Her symptoms have improved significantly.  Xrays show progression of her compression fracture.  Pt has not seen her doctor in a while for this condition.  i suggested following up with her neurosurgeon to discuss Kyphoplasty to see if that may be an option.   Will increase her percocet in the interim.  Plan was discussed with patient and family.    Linwood DibblesJon Myriam Brandhorst, MD 11/03/13 [phone]

## 2013-11-03 ENCOUNTER — Encounter (HOSPITAL_COMMUNITY): Payer: Self-pay | Admitting: Emergency Medicine

## 2013-11-03 ENCOUNTER — Emergency Department (HOSPITAL_COMMUNITY): Payer: Medicare Other

## 2013-11-03 ENCOUNTER — Observation Stay (HOSPITAL_COMMUNITY)
Admission: EM | Admit: 2013-11-03 | Discharge: 2013-11-05 | Disposition: A | Discharge status: Home / Self Care | Payer: Medicare Other | Attending: Internal Medicine | Admitting: Internal Medicine

## 2013-11-03 DIAGNOSIS — S32040A Wedge compression fracture of fourth lumbar vertebra, initial encounter for closed fracture: Secondary | ICD-10-CM

## 2013-11-03 DIAGNOSIS — X58XXXA Exposure to other specified factors, initial encounter: Secondary | ICD-10-CM | POA: Insufficient documentation

## 2013-11-03 DIAGNOSIS — Y939 Activity, unspecified: Secondary | ICD-10-CM | POA: Insufficient documentation

## 2013-11-03 DIAGNOSIS — R739 Hyperglycemia, unspecified: Secondary | ICD-10-CM

## 2013-11-03 DIAGNOSIS — IMO0002 Reserved for concepts with insufficient information to code with codable children: Secondary | ICD-10-CM | POA: Diagnosis present

## 2013-11-03 DIAGNOSIS — Z862 Personal history of diseases of the blood and blood-forming organs and certain disorders involving the immune mechanism: Secondary | ICD-10-CM | POA: Insufficient documentation

## 2013-11-03 DIAGNOSIS — I10 Essential (primary) hypertension: Secondary | ICD-10-CM

## 2013-11-03 DIAGNOSIS — E871 Hypo-osmolality and hyponatremia: Secondary | ICD-10-CM

## 2013-11-03 DIAGNOSIS — S32040S Wedge compression fracture of fourth lumbar vertebra, sequela: Secondary | ICD-10-CM

## 2013-11-03 DIAGNOSIS — Z79899 Other long term (current) drug therapy: Secondary | ICD-10-CM | POA: Diagnosis not present

## 2013-11-03 DIAGNOSIS — Z8669 Personal history of other diseases of the nervous system and sense organs: Secondary | ICD-10-CM | POA: Insufficient documentation

## 2013-11-03 DIAGNOSIS — Z8639 Personal history of other endocrine, nutritional and metabolic disease: Secondary | ICD-10-CM | POA: Insufficient documentation

## 2013-11-03 DIAGNOSIS — S32009A Unspecified fracture of unspecified lumbar vertebra, initial encounter for closed fracture: Principal | ICD-10-CM | POA: Insufficient documentation

## 2013-11-03 DIAGNOSIS — Y929 Unspecified place or not applicable: Secondary | ICD-10-CM | POA: Insufficient documentation

## 2013-11-03 DIAGNOSIS — M810 Age-related osteoporosis without current pathological fracture: Secondary | ICD-10-CM | POA: Diagnosis present

## 2013-11-03 DIAGNOSIS — R7309 Other abnormal glucose: Secondary | ICD-10-CM

## 2013-11-03 DIAGNOSIS — E785 Hyperlipidemia, unspecified: Secondary | ICD-10-CM

## 2013-11-03 DIAGNOSIS — M129 Arthropathy, unspecified: Secondary | ICD-10-CM | POA: Diagnosis not present

## 2013-11-03 LAB — BASIC METABOLIC PANEL
ANION GAP: 14 (ref 5–15)
Anion gap: 12 (ref 5–15)
BUN: 13 mg/dL (ref 6–23)
BUN: 18 mg/dL (ref 6–23)
CHLORIDE: 95 meq/L — AB (ref 96–112)
CHLORIDE: 93 meq/L — AB (ref 96–112)
CO2: 23 meq/L (ref 19–32)
CO2: 26 meq/L (ref 19–32)
Calcium: 9.1 mg/dL (ref 8.4–10.5)
Calcium: 9.6 mg/dL (ref 8.4–10.5)
Creatinine, Ser: 0.61 mg/dL (ref 0.50–1.10)
Creatinine, Ser: 0.68 mg/dL (ref 0.50–1.10)
GFR calc Af Amer: 89 mL/min — ABNORMAL LOW (ref >90)
GFR calc non Af Amer: 74 mL/min — ABNORMAL LOW (ref >90)
GFR calc non Af Amer: 77 mL/min — ABNORMAL LOW (ref >90)
GFR, EST AFRICAN AMERICAN: 86 mL/min — AB (ref >90)
Glucose, Bld: 123 mg/dL — ABNORMAL HIGH (ref 70–99)
Glucose, Bld: 136 mg/dL — ABNORMAL HIGH (ref 70–99)
POTASSIUM: 4.2 meq/L (ref 3.7–5.3)
Potassium: 3.9 mEq/L (ref 3.7–5.3)
SODIUM: 132 meq/L — AB (ref 137–147)
Sodium: 131 mEq/L — ABNORMAL LOW (ref 137–147)

## 2013-11-03 LAB — CBC
HCT: 33.9 % — ABNORMAL LOW (ref 36.0–46.0)
HEMOGLOBIN: 11.8 g/dL — AB (ref 12.0–15.0)
MCH: 32.7 pg (ref 26.0–34.0)
MCHC: 34.8 g/dL (ref 30.0–36.0)
MCV: 93.9 fL (ref 78.0–100.0)
PLATELETS: 243 10*3/uL (ref 150–400)
RBC: 3.61 MIL/uL — AB (ref 3.87–5.11)
RDW: 12.8 % (ref 11.5–15.5)
WBC: 10.6 10*3/uL — AB (ref 4.0–10.5)

## 2013-11-03 MED ORDER — ONDANSETRON HCL 4 MG/2ML IJ SOLN
4.0000 mg | Freq: Four times a day (QID) | INTRAMUSCULAR | Status: DC | PRN
  Administered 2013-11-05: 4 mg via INTRAVENOUS
  Filled 2013-11-03: qty 2

## 2013-11-03 MED ORDER — LOSARTAN POTASSIUM 50 MG PO TABS
100.0000 mg | ORAL_TABLET | Freq: Every day | ORAL | Status: DC
  Administered 2013-11-04 – 2013-11-05 (×2): 100 mg via ORAL
  Filled 2013-11-03 (×2): qty 2

## 2013-11-03 MED ORDER — CALCIUM CARBONATE-VITAMIN D 250-125 MG-UNIT PO TABS
1.0000 | ORAL_TABLET | Freq: Every day | ORAL | Status: DC
  Administered 2013-11-04 – 2013-11-05 (×2): 1 via ORAL
  Filled 2013-11-03 (×2): qty 1

## 2013-11-03 MED ORDER — MAGNESIUM HYDROXIDE 311 MG PO CHEW: 622.0000 mg | CHEWABLE_TABLET | ORAL | Status: DC | PRN

## 2013-11-03 MED ORDER — MORPHINE SULFATE 2 MG/ML IJ SOLN
1.0000 mg | INTRAMUSCULAR | Status: DC | PRN
  Administered 2013-11-04 (×4): 1 mg via INTRAVENOUS
  Filled 2013-11-03 (×4): qty 1

## 2013-11-03 MED ORDER — VITAMIN D3 25 MCG (1000 UNIT) PO TABS
1000.0000 [IU] | ORAL_TABLET | Freq: Every day | ORAL | Status: DC
  Administered 2013-11-04 – 2013-11-05 (×2): 1000 [IU] via ORAL
  Filled 2013-11-03 (×2): qty 1

## 2013-11-03 MED ORDER — OXYCODONE HCL 5 MG PO TABS: 5.0000 mg | ORAL_TABLET | ORAL | Status: DC | PRN

## 2013-11-03 MED ORDER — ADULT MULTIVITAMIN W/MINERALS CH
1.0000 | ORAL_TABLET | Freq: Every day | ORAL | Status: DC
  Administered 2013-11-04 – 2013-11-05 (×2): 1 via ORAL
  Filled 2013-11-03 (×2): qty 1

## 2013-11-03 MED ORDER — MORPHINE SULFATE 4 MG/ML IJ SOLN
6.0000 mg | Freq: Once | INTRAMUSCULAR | Status: AC
  Administered 2013-11-03: 6 mg via INTRAVENOUS
  Filled 2013-11-03: qty 2

## 2013-11-03 MED ORDER — LORAZEPAM 2 MG/ML IJ SOLN
1.0000 mg | Freq: Once | INTRAMUSCULAR | Status: AC
  Administered 2013-11-03: 1 mg via INTRAVENOUS
  Filled 2013-11-03: qty 1

## 2013-11-03 MED ORDER — MORPHINE SULFATE 4 MG/ML IJ SOLN
6.0000 mg | Freq: Once | INTRAMUSCULAR | Status: DC
  Filled 2013-11-03: qty 2

## 2013-11-03 MED ORDER — VITAMIN B-1 100 MG PO TABS
100.0000 mg | ORAL_TABLET | Freq: Every day | ORAL | Status: DC
  Administered 2013-11-04 – 2013-11-05 (×2): 100 mg via ORAL
  Filled 2013-11-03 (×2): qty 1

## 2013-11-03 MED ORDER — KETOROLAC TROMETHAMINE 30 MG/ML IJ SOLN
30.0000 mg | Freq: Once | INTRAMUSCULAR | Status: AC
  Administered 2013-11-03: 30 mg via INTRAVENOUS
  Filled 2013-11-03: qty 1

## 2013-11-03 MED ORDER — ZOLPIDEM TARTRATE 5 MG PO TABS
5.0000 mg | ORAL_TABLET | Freq: Every evening | ORAL | Status: DC | PRN
  Administered 2013-11-04 (×2): 5 mg via ORAL
  Filled 2013-11-03 (×2): qty 1

## 2013-11-03 MED ORDER — ONDANSETRON HCL 4 MG PO TABS: 4.0000 mg | ORAL_TABLET | Freq: Four times a day (QID) | ORAL | Status: DC | PRN

## 2013-11-03 MED ORDER — OXYCODONE-ACETAMINOPHEN 5-325 MG PO TABS: 1.0000 | ORAL_TABLET | Freq: Four times a day (QID) | ORAL | Status: DC | PRN

## 2013-11-03 MED ORDER — ASPIRIN EC 81 MG PO TBEC
81.0000 mg | DELAYED_RELEASE_TABLET | Freq: Every day | ORAL | Status: DC
  Administered 2013-11-04 – 2013-11-05 (×2): 81 mg via ORAL
  Filled 2013-11-03 (×2): qty 1

## 2013-11-03 MED ORDER — BUPROPION HCL ER (SR) 150 MG PO TB12
150.0000 mg | ORAL_TABLET | Freq: Every day | ORAL | Status: DC
  Administered 2013-11-04 – 2013-11-05 (×2): 150 mg via ORAL
  Filled 2013-11-03 (×2): qty 1

## 2013-11-03 MED ORDER — SODIUM CHLORIDE 0.9 % IV SOLN
INTRAVENOUS | Status: DC
Start: 2013-11-03 — End: 2013-11-05
  Administered 2013-11-04 – 2013-11-05 (×3): via INTRAVENOUS

## 2013-11-03 MED ORDER — OXYCODONE-ACETAMINOPHEN 5-325 MG PO TABS
1.0000 | ORAL_TABLET | Freq: Four times a day (QID) | ORAL | Status: DC | PRN
  Administered 2013-11-05: 2 via ORAL
  Filled 2013-11-03: qty 2

## 2013-11-03 MED ORDER — DOCUSATE SODIUM 100 MG PO CAPS
100.0000 mg | ORAL_CAPSULE | Freq: Two times a day (BID) | ORAL | Status: DC
  Administered 2013-11-04 – 2013-11-05 (×4): 100 mg via ORAL
  Filled 2013-11-03 (×5): qty 1

## 2013-11-03 MED ORDER — HEPARIN SODIUM (PORCINE) 5000 UNIT/ML IJ SOLN
5000.0000 [IU] | Freq: Three times a day (TID) | INTRAMUSCULAR | Status: DC
  Administered 2013-11-04 – 2013-11-05 (×5): 5000 [IU] via SUBCUTANEOUS
  Filled 2013-11-03 (×8): qty 1

## 2013-11-03 MED ORDER — BISACODYL 5 MG PO TBEC: 5.0000 mg | DELAYED_RELEASE_TABLET | Freq: Every day | ORAL | Status: DC | PRN

## 2013-11-03 MED ORDER — FOLIC ACID 1 MG PO TABS
1.0000 mg | ORAL_TABLET | Freq: Every day | ORAL | Status: DC
  Administered 2013-11-04 – 2013-11-05 (×2): 1 mg via ORAL
  Filled 2013-11-03 (×2): qty 1

## 2013-11-03 NOTE — ED Provider Notes (Addendum)
MR LUMBAR SPINE WO CONTRAST   Final Result:        MR Lumbar Spine Wo Contrast (Final result)  Result time: 11/03/13 16:51:33    Final result by Rad Results In Interface (11/03/13 16:51:33)    Narrative:   CLINICAL DATA: Low back pain radiating into the left leg. Compression fracture shown on recent radiographs.  EXAM: MRI LUMBAR SPINE WITHOUT CONTRAST  TECHNIQUE: Multiplanar, multisequence MR imaging of the lumbar spine was performed. No intravenous contrast was administered.  COMPARISON: Multiple exams, including 11/02/2013 and 04/04/2011  FINDINGS: Considerable lumbar scoliosis noted with levoconvex upper an dextroconvex lower components and significant rotary component. Distended gallbladder.  T2 hyperintense intervertebral disc at L4-5. Inferior endplate compression fracture at L4. There is an old superior endplate compression fracture at L4 and an old inferior endplate compression fracture at L3.  The conus medullaris appears normal. Conus level: L1.  Additional findings at individual levels are as follows:  L1-2: Borderline right subarticular lateral recess stenosis due to disc bulge.  L2-3: Mild to moderate right subarticular lateral recess stenosis and borderline right foraminal stenosis due to intervertebral spurring, disc bulge, and facet arthropathy.  L3-4: Moderate to prominent central stenosis with moderate to prominent bilateral foraminal stenosis and moderate bilateral subarticular lateral recess stenosis due to posterior osseous ridging, facet arthropathy, ligamentum flavum redundancy, and likely some degree of chronic posterior bony retropulsion from L3.  L4-5: Prominent central narrowing of the thecal sac with prominent left and mild right foraminal stenosis as well as prominent left and moderate right subarticular lateral recess stenosis due to facet arthropathy, intervertebral spurring, disc bulge, left foraminal disc protrusion, and  potentially minimal bony retropulsion.  L5-S1: Moderate left foraminal stenosis and mild left subarticular lateral recess stenosis due to left greater than right facet arthropathy, disc bulge, and left foraminal disc protrusion.  IMPRESSION: 1. New inferior endplate compression fracture at L4. The T2 hyperintensity in the L4-5 intervertebral disc is probably related to the subacute fracture and less likely to be due to discitis. 2. Lumbar spondylosis and degenerative disc disease, causing prominent impingement at L4-5; moderate to prominent impingement at L3-4 ; moderate impingement at L5-S1; and mild to moderate impingement at L2-3, as detailed above. 3. Considerable lumbar scoliosis with rotary component.   Electronically Signed By: Herbie BaltimoreWalt Liebkemann M.D. On: 11/03/2013 16:51     Assumed care of pt from Dr. Patria Maneampos.  New L 4 comp fracture seen yesterday.  Has ongoing pain.  Getting MR of lumbar spine.    MR as above. Redemonstrated L4 compression fracture, no acute cord pathology present, however there is degenerative disc disease.  Patient has no historical cauda equina.  Pt received ativan prior to my assumption of care.  She became drowsy and was unable to ambulate on her own and was very mildly disoriented, however had no respiratory insufficiency or vital abnormality, and at that time did not complain of pain.  She was monitored without event and had resolution of these symptoms, however then she again began to complain of pain.  Family and pt state that the pt cannot care for herself at home with this level of pain. Consulted hospitalist for admission.   The primary encounter diagnosis was Compression fracture of L4 lumbar vertebra, closed, initial encounter. Diagnoses of Essential hypertension, Hyperglycemia, Hyperlipidemia, and Hyponatremia were also pertinent to this visit.   Mirian MoMatthew Gentry, MD 11/04/13 1307  Mirian MoMatthew Gentry, MD 11/04/13 1308  Mirian MoMatthew Gentry, MD 11/04/13  1309

## 2013-11-03 NOTE — H&P (Signed)
[facility] History and Physical  DERITA MICHELSEN ZHY:[id number] DOB: [birth year] DOA: 11/03/2013  Referring physician: Mirian Mo, MD PCP: Pearson Grippe, MD   Chief Complaint: back pain  HPI: Nancy Meyer is a 80 y.o. female with chronic back pain presents to the ED with worsening of her back pain. She denies having any fall or recent injury. She states that she had noted increased pain and could not even get around in her home. She has no one who can care for her at home. She states that she was seen here yesterday for the same complaints. She does have an appointment scheduled with neurosurgery on Friday but she cannot tolerate the severity of her pain. She states that she has had some weakness in her left foot. Patient has no other complaints.   Review of Systems:  Constitutional:  No weight loss, night sweats HEENT:  No headaches  Cardio-vascular:  No chest pain, Orthopnea, PND, palpitations  GI:  No heartburn, indigestion, abdominal pain, nausea, vomiting, diarrhea  Resp:  No shortness of breath with exertion or at rest. No excess mucus, no productive cough  Skin:  no rash or lesions.  GU:  no dysuria, change in color of urine, no urgency or frequency Musculoskeletal:  No joint pain or swelling. +++back pain.  Psych:  No change in mood or affect.No memory loss.   Past Medical History  Diagnosis Date  . Hypertension   . Osteoporosis   . Macular degeneration   . Arthritis   . Hyperlipidemia    Past Surgical History  Procedure Laterality Date  . Appendectomy    . Eye surgery     Social History:  reports that she has never smoked. She has never used smokeless tobacco. She reports that she does not drink alcohol or use illicit drugs.  No Known Allergies  Family History  Problem Relation Age of Onset  . Cancer Daughter     breast     Prior to Admission medications   Medication Sig Start Date End Date Taking? Authorizing Provider  bisacodyl  (DULCOLAX) 5 MG EC tablet Take 5 mg by mouth daily as needed for mild constipation.    Yes Historical Provider, MD  buPROPion (WELLBUTRIN SR) 150 MG 12 hr tablet Take 150 mg by mouth daily.    Yes Historical Provider, MD  calcium citrate-vitamin D 200-200 MG-UNIT TABS Take 1 tablet by mouth daily.    Yes Historical Provider, MD  cholecalciferol (VITAMIN D) 1000 UNITS tablet Take 1,000 Units by mouth every morning.    Yes Historical Provider, MD  losartan (COZAAR) 100 MG tablet Take 100 mg by mouth daily.  07/12/11  Yes Historical Provider, MD  magnesium hydroxide (PHILLIPS CHEWS) 311 MG CHEW chewable tablet Chew 622 mg by mouth every 4 (four) hours as needed (constipation).   Yes Historical Provider, MD  Multiple Vitamins-Minerals (PRESERVISION AREDS 2 PO) Take 2 tablets by mouth daily.   Yes Historical Provider, MD  naproxen sodium (ANAPROX) 220 MG tablet Take 440 mg by mouth 2 (two) times daily as needed (pain).   Yes Historical Provider, MD  oxyCODONE-acetaminophen (PERCOCET/ROXICET) 5-325 MG per tablet Take 1-2 tablets by mouth every 6 (six) hours as needed for severe pain. 11/03/13  Yes Linwood Dibbles, MD  oxyCODONE (OXY IR/ROXICODONE) 5 MG immediate release tablet Take 1 tablet (5 mg total) by mouth every 4 (four) hours as needed for severe pain. 11/03/13   Mirian Mo, MD   Physical Exam: Ceasar Mons Vitals:  11/03/13 1425 11/03/13 1645 11/03/13 1856 11/03/13 2113  BP: 170/76 167/91 143/68 151/75  Pulse: 92 94 89 85  Temp:      TempSrc:      Resp: 18 16 14 16   SpO2: 100% 96% 97% 99%    Wt Readings from Last 3 Encounters:  08/09/11 44.18 kg (97 lb 6.4 oz)  02/28/11 48.988 kg (108 lb)    General:  Appears calm and comfortable Eyes: PERRL, normal lids, irises & conjunctiva ENT: grossly normal hearing, lips & tongue Neck: no LAD, masses or thyromegaly Cardiovascular: RRR, no m/r/g. No LE edema. Respiratory: CTA bilaterally, no w/r/r. Normal respiratory effort. Abdomen: soft, ntnd Skin: no  rash or induration seen on limited exam Musculoskeletal: c/o pain in the back and with any movement Psychiatric: grossly normal mood and affect, speech fluent and appropriate Neurologic: possibly slight weakness in the left foot          Labs on Admission:  Basic Metabolic Panel:  Recent Labs Lab 11/02/13 2344 11/03/13 1402  NA 131* 132*  K 4.2 3.9  CL 93* 95*  CO2 26 23  GLUCOSE 123* 136*  BUN 18 13  CREATININE 0.68 0.61  CALCIUM 9.6 9.1   Liver Function Tests: No results found for this basename: AST, ALT, ALKPHOS, BILITOT, PROT, ALBUMIN,  in the last 168 hours No results found for this basename: LIPASE, AMYLASE,  in the last 168 hours No results found for this basename: AMMONIA,  in the last 168 hours CBC:  Recent Labs Lab 11/02/13 2344 11/03/13 1402  WBC 8.1 10.6*  NEUTROABS 6.2  --   HGB 12.3 11.8*  HCT 36.8 33.9*  MCV 95.1 93.9  PLT 231 243   Cardiac Enzymes: No results found for this basename: CKTOTAL, CKMB, CKMBINDEX, TROPONINI,  in the last 168 hours  BNP (last 3 results) No results found for this basename: PROBNP,  in the last 8760 hours CBG: No results found for this basename: GLUCAP,  in the last 168 hours  Radiological Exams on Admission: Dg Lumbar Spine Complete  11/03/2013   CLINICAL DATA:  Worsening back pain radiating to left leg.  EXAM: LUMBAR SPINE - COMPLETE 4+ VIEW  COMPARISON:  Lumbar spine radiographs Aug 20, 2012  FINDINGS: Moderate to severe L4 compression fracture, with progressed height loss. Remaining lumbar vertebra appear intact. Severe upper lumbar broad levoscoliosis. Maintenance of lumbar lordosis. Severe L2-3 through L5-S1 disc degeneration stent with multilevel severe facet arthropathy. Bone mineral density is decreased without destructive bony lesions. No definite pars interarticularis defects though scoliosis limits evaluation.  Moderate calcific atherosclerosis of the aorta.  IMPRESSION: Moderate-to-severe chronic L4  compression/insufficiency fracture, mild progressed height loss. No malalignment. Severe broad levoscoliosis, similar.  Osteopenia, which decreases sensitivity for acute nondisplaced fractures.   Electronically Signed   By: Awilda Metro   On: 11/03/2013 00:46   Mr Lumbar Spine Wo Contrast  11/03/2013   CLINICAL DATA:  Low back pain radiating into the left leg. Compression fracture shown on recent radiographs.  EXAM: MRI LUMBAR SPINE WITHOUT CONTRAST  TECHNIQUE: Multiplanar, multisequence MR imaging of the lumbar spine was performed. No intravenous contrast was administered.  COMPARISON:  Multiple exams, including 11/02/2013 and 04/04/2011  FINDINGS: Considerable lumbar scoliosis noted with levoconvex upper an dextroconvex lower components and significant rotary component. Distended gallbladder.  T2 hyperintense intervertebral disc at L4-5. Inferior endplate compression fracture at L4. There is an old superior endplate compression fracture at L4 and an old inferior endplate compression fracture  at L3.  The conus medullaris appears normal.  Conus level:  L1.  Additional findings at individual levels are as follows:  L1-2: Borderline right subarticular lateral recess stenosis due to disc bulge.  L2-3: Mild to moderate right subarticular lateral recess stenosis and borderline right foraminal stenosis due to intervertebral spurring, disc bulge, and facet arthropathy.  L3-4: Moderate to prominent central stenosis with moderate to prominent bilateral foraminal stenosis and moderate bilateral subarticular lateral recess stenosis due to posterior osseous ridging, facet arthropathy, ligamentum flavum redundancy, and likely some degree of chronic posterior bony retropulsion from L3.  L4-5: Prominent central narrowing of the thecal sac with prominent left and mild right foraminal stenosis as well as prominent left and moderate right subarticular lateral recess stenosis due to facet arthropathy, intervertebral spurring,  disc bulge, left foraminal disc protrusion, and potentially minimal bony retropulsion.  L5-S1: Moderate left foraminal stenosis and mild left subarticular lateral recess stenosis due to left greater than right facet arthropathy, disc bulge, and left foraminal disc protrusion.  IMPRESSION: 1. New inferior endplate compression fracture at L4. The T2 hyperintensity in the L4-5 intervertebral disc is probably related to the subacute fracture and less likely to be due to discitis. 2. Lumbar spondylosis and degenerative disc disease, causing prominent impingement at L4-5; moderate to prominent impingement at L3-4 ; moderate impingement at L5-S1; and mild to moderate impingement at L2-3, as detailed above. 3. Considerable lumbar scoliosis with rotary component.   Electronically Signed   By: Herbie BaltimoreWalt  Liebkemann M.D.   On: 11/03/2013 16:51     Assessment/Plan Principal Problem:   Compression fracture Active Problems:   HTN (hypertension)   Hyperlipidemia   Hyponatremia   Hyperglycemia   Fracture of vertebra   1. L4 Compression fracture with severe Pain -she will be admitted for pain control -currently she has no neurological deficits noted though her strength is somewhat decreased in the left foot -will get Neurosurgery to see her -may need pain management consult -may need SNF placement  2. Hypertension -will contionue with her home medications -monitor Blood Pressures -will control her pain  3. Hyponatremia -will give IV fluids -repeat labs in the morning  4. Hyperglycemia -no formal diagnosis of Diabetes Mellitus -will check A1C  5. Hyperlipidemia -will check lipid panel -continue with home medications   Code Status: Full Code (must indicate code status--if unknown or must be presumed, indicate so) DVT Prophylaxis:Heparin Family Communication: Son in room (indicate person spoken with, if applicable, with phone number if by telephone) Disposition Plan: Possible SNF (indicate  anticipated LOS)  Time spent: 60min  [facility] HospitalKHAN,SAADAT A [facility] Pager [phone]  **Disclaimer: This note may have been dictated with voice recognition software. Similar sounding words can inadvertently be transcribed and this note may contain transcription errors which may not have been corrected upon publication of note.**

## 2013-11-03 NOTE — ED Provider Notes (Signed)
CSN: [id number]     Arrival date & time 11/03/13  1335 History   First MD Initiated Contact with Patient 11/03/13 1344     Chief Complaint  Patient presents with  . Back Pain      HPI Patient has a history of left-sided sciatica.  She is followed by Dr. Jule Ser for this.  She states some increasing of her pain over the past several days.  She was seen in emergency department yesterday and diagnosed with L4 compression fracture based on plain film.  No new weakness in her legs.  No fevers or chills.  No bowel or bladder complaints.  She was sent home with prescription for Percocet reports increasing pain at this time unrelieved by the pain medicine.  She presents now with moderate to severe pain continuing to radiate down her left buttock and into her left leg.  She denies new weakness.   Past Medical History  Diagnosis Date  . Hypertension   . Osteoporosis   . Macular degeneration   . Arthritis   . Hyperlipidemia    Past Surgical History  Procedure Laterality Date  . Appendectomy    . Eye surgery     Family History  Problem Relation Age of Onset  . Cancer Daughter     breast   History  Substance Use Topics  . Smoking status: Never Smoker   . Smokeless tobacco: Never Used  . Alcohol Use: No   OB History   Grav Para Term Preterm Abortions TAB SAB Ect Mult Living   3 3 3             Review of Systems  All other systems reviewed and are negative.     Allergies  Review of patient's allergies indicates no known allergies.  Home Medications   Prior to Admission medications   Medication Sig Start Date End Date Taking? Authorizing Provider  bisacodyl (DULCOLAX) 5 MG EC tablet Take 5 mg by mouth daily as needed for mild constipation.    Yes Historical Provider, MD  buPROPion (WELLBUTRIN SR) 150 MG 12 hr tablet Take 150 mg by mouth daily.    Yes Historical Provider, MD  calcium citrate-vitamin D 200-200 MG-UNIT TABS Take 1 tablet by mouth daily.    Yes Historical  Provider, MD  cholecalciferol (VITAMIN D) 1000 UNITS tablet Take 1,000 Units by mouth every morning.    Yes Historical Provider, MD  losartan (COZAAR) 100 MG tablet Take 100 mg by mouth daily.  07/12/11  Yes Historical Provider, MD  magnesium hydroxide (PHILLIPS CHEWS) 311 MG CHEW chewable tablet Chew 622 mg by mouth every 4 (four) hours as needed (constipation).   Yes Historical Provider, MD  Multiple Vitamins-Minerals (PRESERVISION AREDS 2 PO) Take 2 tablets by mouth daily.   Yes Historical Provider, MD  naproxen sodium (ANAPROX) 220 MG tablet Take 440 mg by mouth 2 (two) times daily as needed (pain).   Yes Historical Provider, MD  oxyCODONE-acetaminophen (PERCOCET/ROXICET) 5-325 MG per tablet Take 1-2 tablets by mouth every 6 (six) hours as needed for severe pain. 11/03/13  Yes Linwood Dibbles, MD   BP 171/97  Pulse 104  Temp(Src) 98.2 F (36.8 C) (Oral)  Resp 18  SpO2 96% Physical Exam  Nursing note and vitals reviewed. Constitutional: She is oriented to person, place, and time. She appears well-developed and well-nourished. No distress.  HENT:  Head: Normocephalic and atraumatic.  Eyes: EOM are normal.  Neck: Normal range of motion.  Cardiovascular: Normal rate, regular rhythm  and normal heart sounds.   Pulmonary/Chest: Effort normal and breath sounds normal.  Abdominal: Soft. She exhibits no distension. There is no tenderness.  Musculoskeletal: Normal range of motion.  No lumbar spine point tenderness.  Patient with pain in the left sciatic groove.  Normal pulses in her bilateral feet.  5 of 5 strength in major muscle groups of lower extremities.  Neurological: She is alert and oriented to person, place, and time.  Skin: Skin is warm and dry.  Psychiatric: She has a normal mood and affect. Judgment normal.    ED Course  Procedures (including critical care time) Labs Review Labs Reviewed - No data to display  Imaging Review Dg Lumbar Spine Complete  11/03/2013   CLINICAL DATA:   Worsening back pain radiating to left leg.  EXAM: LUMBAR SPINE - COMPLETE 4+ VIEW  COMPARISON:  Lumbar spine radiographs Aug 20, 2012  FINDINGS: Moderate to severe L4 compression fracture, with progressed height loss. Remaining lumbar vertebra appear intact. Severe upper lumbar broad levoscoliosis. Maintenance of lumbar lordosis. Severe L2-3 through L5-S1 disc degeneration stent with multilevel severe facet arthropathy. Bone mineral density is decreased without destructive bony lesions. No definite pars interarticularis defects though scoliosis limits evaluation.  Moderate calcific atherosclerosis of the aorta.  IMPRESSION: Moderate-to-severe chronic L4 compression/insufficiency fracture, mild progressed height loss. No malalignment. Severe broad levoscoliosis, similar.  Osteopenia, which decreases sensitivity for acute nondisplaced fractures.   Electronically Signed   By: Awilda Metroourtnay  Bloomer   On: 11/03/2013 00:46     EKG Interpretation None      MDM   Final diagnoses:  None    Patient will undergo MRI of her lumbar spine at this time.  Pain will be treated.  Disposition will be determined based on pain control and MRI findings.  Care to Dr. Littie DeedsGentry to follow up on imaging    Lyanne CoKevin M Girtrude Enslin, MD 11/03/13 [phone]

## 2013-11-03 NOTE — Discharge Instructions (Signed)
Back, Compression Fracture °A compression fracture happens when a force is put upon the length of your spine. Slipping and falling on your bottom are examples of such a force. When this happens, sometimes the force is great enough to compress the building blocks (vertebral bodies) of your spine. Although this causes a lot of pain, this can usually be treated at home, unless your caregiver feels hospitalization is needed for pain control. °Your backbone (spinal column) is made up of 24 main vertebral bodies in addition to the sacrum and coccyx (see illustration). These are held together by tough fibrous tissues (ligaments) and by support of your muscles. Nerve roots pass through the openings between the vertebrae. A sudden wrenching move, injury, or a fall may cause a compression fracture of one of the vertebral bodies. This may result in back pain or spread of pain into the belly (abdomen), the buttocks, and down the leg into the foot. Pain may also be created by muscle spasm alone. °Large studies have been undertaken to determine the best possible course of action to help your back following injury and also to prevent future problems. The recommendations are as follows. °FOLLOWING A COMPRESSION FRACTURE: °Do the following only if advised by your caregiver.  °· If a back brace has been suggested or provided, wear it as directed. °· Do not stop wearing the back brace unless instructed by your caregiver. °· When allowed to return to regular activities, avoid a sedentary lifestyle. Actively exercise. Sporadic weekend binges of tennis, racquetball, or waterskiing may actually aggravate or create problems, especially if you are not in condition for that activity. °· Avoid sports requiring sudden body movements until you are in condition for them. Swimming and walking are safer activities. °· Maintain good posture. °· Avoid obesity. °· If not already done, you should have a DEXA scan. Based on the results, be treated for  osteoporosis. °FOLLOWING ACUTE (SUDDEN) INJURY: °· Only take over-the-counter or prescription medicines for pain, discomfort, or fever as directed by your caregiver. °· Use bed rest for only the most extreme acute episode. Prolonged bed rest may aggravate your condition. Ice used for acute conditions is effective. Use a large plastic bag filled with ice. Wrap it in a towel. This also provides excellent pain relief. This may be continuous. Or use it for 30 minutes every 2 hours during acute phase, then as needed. Heat for 30 minutes prior to activities is helpful. °· As soon as the acute phase (the time when your back is too painful for you to do normal activities) is over, it is important to resume normal activities and work hardening programs. Back injuries can cause potentially marked changes in lifestyle. So it is important to attack these problems aggressively. °· See your caregiver for continued problems. He or she can help or refer you for appropriate exercises, physical therapy, and work hardening if needed. °· If you are given narcotic medications for your condition, for the next 24 hours do not: °¨ Drive. °¨ Operate machinery or power tools. °¨ Sign legal documents. °· Do not drink alcohol, or take sleeping pills or other medications that may interfere with treatment. °If your caregiver has given you a follow-up appointment, it is very important to keep that appointment. Not keeping the appointment could result in a chronic or permanent injury, pain, and disability. If there is any problem keeping the appointment, you must call back to this facility for assistance.  °SEEK IMMEDIATE MEDICAL CARE IF: °· You develop numbness,   tingling, weakness, or problems with the use of your arms or legs. °· You develop severe back pain not relieved with medications. °· You have changes in bowel or bladder control. °· You have increasing pain in any areas of the body. °Document Released: 03/12/2005 Document Revised:  07/27/2013 Document Reviewed: 10/15/2007 °ExitCare® Patient Information ©2015 [employer]. This information is not intended to replace advice given to you by your health care provider. Make sure you discuss any questions you have with your health care provider. ° °

## 2013-11-03 NOTE — ED Notes (Signed)
Patient transported to MRI 

## 2013-11-03 NOTE — ED Notes (Signed)
Pt c/o lower back pain that goes down left leg x 2 years. Pt was seen yesterday for same.

## 2013-11-03 NOTE — Discharge Instructions (Signed)
Vertebral Fracture  You have a fracture of one or more vertebra. These are the bony parts that form the spine. Minor vertebral fractures happen when people fall. Osteoporosis is associated with many of these fractures. Hospital care may not be necessary for minor compression fractures that are stable. However, multiple fractures of the spine or unstable injuries can cause severe pain and even damage the spinal cord. A spinal cord injury may cause paralysis, numbness, or loss of normal bowel and bladder control.   Normally there is pain and stiffness in the back for 3 to 6 weeks after a vertebral fracture. Bed rest for several days, pain medicine, and a slow return to activity is often the only treatment that is needed depending on the location of the fracture. Neck and back braces may be helpful in reducing pain and increasing mobility. When your pain allows, you should begin walking or swimming to help maintain your endurance. Exercises to improve motion and to strengthen the back may also be useful after the initial pain improves. Treatment for osteoporosis may be essential for full recovery. This will help reduce your risk of vertebral fractures with a future fall.  During the first few days after a spine fracture you may feel nauseated or vomit. If this is severe, hospital care with IV fluids will be needed.   Arrange for follow-up care as recommended to assure proper long-term care and prevention of further spine injury.   SEEK IMMEDIATE MEDICAL CARE IF:   You have increasing pain, vomiting, or are unable to move around at all.   You develop numbness, tingling, weakness, or paralysis of any part of your body.   You develop a loss of normal bowel or bladder control.   You have difficulty breathing, cough, fever, chest or abdominal pain.  MAKE SURE YOU:    Understand these instructions.   Will watch your condition.   Will get help right away if you are not doing well or get worse.  Document Released:  04/19/2004 Document Revised: 06/04/2011 Document Reviewed: 11/02/2008  ExitCare Patient Information 2015 [employer]. This information is not intended to replace advice given to you by your health care provider. Make sure you discuss any questions you have with your health care provider.

## 2013-11-03 NOTE — ED Notes (Signed)
Pt returned from MRI °

## 2013-11-03 NOTE — ED Notes (Signed)
Called [facility] to cancel transport request following "Consult Hospitalist order"

## 2013-11-03 NOTE — ED Notes (Signed)
Pt ambulated with one assist without difficulty.

## 2013-11-04 LAB — COMPREHENSIVE METABOLIC PANEL
ALT: 11 U/L (ref 0–35)
AST: 15 U/L (ref 0–37)
Albumin: 3.2 g/dL — ABNORMAL LOW (ref 3.5–5.2)
Alkaline Phosphatase: 48 U/L (ref 39–117)
Anion gap: 10 (ref 5–15)
BUN: 13 mg/dL (ref 6–23)
CO2: 25 meq/L (ref 19–32)
CREATININE: 0.65 mg/dL (ref 0.50–1.10)
Calcium: 8.9 mg/dL (ref 8.4–10.5)
Chloride: 99 mEq/L (ref 96–112)
GFR, EST AFRICAN AMERICAN: 87 mL/min — AB (ref >90)
GFR, EST NON AFRICAN AMERICAN: 75 mL/min — AB (ref >90)
GLUCOSE: 106 mg/dL — AB (ref 70–99)
Potassium: 4 mEq/L (ref 3.7–5.3)
Sodium: 134 mEq/L — ABNORMAL LOW (ref 137–147)
Total Bilirubin: 0.5 mg/dL (ref 0.3–1.2)
Total Protein: 5.9 g/dL — ABNORMAL LOW (ref 6.0–8.3)

## 2013-11-04 LAB — GLUCOSE, CAPILLARY: GLUCOSE-CAPILLARY: 108 mg/dL — AB (ref 70–99)

## 2013-11-04 LAB — CBC
HCT: 31.4 % — ABNORMAL LOW (ref 36.0–46.0)
Hemoglobin: 10.8 g/dL — ABNORMAL LOW (ref 12.0–15.0)
MCH: 32.2 pg (ref 26.0–34.0)
MCHC: 34.4 g/dL (ref 30.0–36.0)
MCV: 93.7 fL (ref 78.0–100.0)
PLATELETS: 204 10*3/uL (ref 150–400)
RBC: 3.35 MIL/uL — ABNORMAL LOW (ref 3.87–5.11)
RDW: 12.7 % (ref 11.5–15.5)
WBC: 7.4 10*3/uL (ref 4.0–10.5)

## 2013-11-04 LAB — TSH: TSH: 1.65 u[IU]/mL (ref 0.350–4.500)

## 2013-11-04 LAB — HEMOGLOBIN A1C
HEMOGLOBIN A1C: 5.7 % — AB (ref <5.7)
Mean Plasma Glucose: 117 mg/dL — ABNORMAL HIGH (ref <117)

## 2013-11-04 MED ORDER — METHOCARBAMOL 500 MG PO TABS
500.0000 mg | ORAL_TABLET | Freq: Four times a day (QID) | ORAL | Status: DC | PRN
  Administered 2013-11-04 – 2013-11-05 (×2): 500 mg via ORAL
  Filled 2013-11-04 (×2): qty 1

## 2013-11-04 MED ORDER — LIDOCAINE 5 % EX PTCH
1.0000 | MEDICATED_PATCH | CUTANEOUS | Status: DC
  Administered 2013-11-04 – 2013-11-05 (×2): 1 via TRANSDERMAL
  Filled 2013-11-04 (×3): qty 1

## 2013-11-04 MED ORDER — HYDROMORPHONE HCL PF 1 MG/ML IJ SOLN
0.5000 mg | INTRAMUSCULAR | Status: DC | PRN
  Administered 2013-11-04 – 2013-11-05 (×7): 0.5 mg via INTRAVENOUS
  Filled 2013-11-04 (×7): qty 1

## 2013-11-04 NOTE — Evaluation (Signed)
Physical Therapy Evaluation Patient Details Name: Nancy JewelBarbara P Yung MRN: [id number] DOB: May 18, 1920 Today's Date: 11/04/2013   History of Present Illness  80 yo female admitted with L4 comp fx with impingement. Hx of chronic back pain with L LE sciatica, osteopenia, macular degeneration. Pt is from home alone  Clinical Impression  On eval, pt required Mod assist for mobility-able to ambulate ~20 feet with rolling walker but not without significant pain, difficulty. Pain is still 9/10 with activity, even with pre-medication with morphine. Pt awaiting consult from MD, possibly neuro, on today. . Pt does live alone. Do not feel pt would be able to safely mobilize at home alone at this time. Will follow during stay    Follow Up Recommendations SNF    Equipment Recommendations  None recommended by PT    Recommendations for Other Services OT consult     Precautions / Restrictions Precautions Precautions: Fall Restrictions Weight Bearing Restrictions: No      Mobility  Bed Mobility Overal bed mobility: Needs Assistance Bed Mobility: Rolling;Sidelying to Sit;Sit to Sidelying Rolling: Min guard Sidelying to sit: Mod assist     Sit to sidelying: Mod assist General bed mobility comments: assist for trunk and bil LEs. Increased time. VCs safety, technique.   Transfers Overall transfer level: Needs assistance Equipment used: Rolling walker (2 wheeled) Transfers: Sit to/from Stand Sit to Stand: Mod assist         General transfer comment: assist to rise, stabilize, control descent. VCS safety, technique, hand placement  Ambulation/Gait Ambulation/Gait assistance: Min assist Ambulation Distance (Feet): 20 Feet Assistive device: Rolling walker (2 wheeled) Gait Pattern/deviations: Trunk flexed;Step-through pattern;Decreased stride length     General Gait Details: VCs for posture throughout ambulation distance. Pt tends to maintain significant flexed head, neck posture. Assist  to stabilize and maneuver with RW. Distance limited by back, L LE pain.   Stairs            Wheelchair Mobility    Modified Rankin (Stroke Patients Only)       Balance Overall balance assessment: Needs assistance         Standing balance support: Bilateral upper extremity supported;During functional activity Standing balance-Leahy Scale: Poor                               Pertinent Vitals/Pain Pain Assessment: 0-10 Pain Score: 9  (4 at rest) Pain Location: low back, L LE.  Pain Intervention(s): Limited activity within patient's tolerance;Premedicated before session;Repositioned    Home Living Family/patient expects to be discharged to:: Skilled nursing facility Living Arrangements: Alone                    Prior Function Level of Independence: Independent               Hand Dominance        Extremity/Trunk Assessment   Upper Extremity Assessment: Overall WFL for tasks assessed           Lower Extremity Assessment: LLE deficits/detail      Cervical / Trunk Assessment: Kyphotic  Communication   Communication: No difficulties  Cognition Arousal/Alertness: Awake/alert Behavior During Therapy: WFL for tasks assessed/performed Overall Cognitive Status: Within Functional Limits for tasks assessed                      General Comments      Exercises  Assessment/Plan    PT Assessment Patient needs continued PT services  PT Diagnosis Difficulty walking;Generalized weakness;Acute pain   PT Problem List Decreased activity tolerance;Decreased balance;Decreased mobility;Pain;Decreased knowledge of precautions;Decreased strength  PT Treatment Interventions DME instruction;Gait training;Functional mobility training;Therapeutic exercise;Therapeutic activities;Patient/family education;Balance training   PT Goals (Current goals can be found in the Care Plan section) Acute Rehab PT Goals Patient Stated Goal: less  pain. regain mobility and independence PT Goal Formulation: With patient/family Time For Goal Achievement: 11/18/13 Potential to Achieve Goals: Good    Frequency Min 3X/week   Barriers to discharge        Co-evaluation               End of Session Equipment Utilized During Treatment: Gait belt Activity Tolerance: Patient limited by pain Patient left: in bed;with call bell/phone within reach;with family/visitor present      Functional Assessment Tool Used: clinical judgement Functional Limitation: Mobility: Walking and moving around Mobility: Walking and Moving Around Current Status ([id number]): At least 20 percent but less than 40 percent impaired, limited or restricted Mobility: Walking and Moving Around Goal Status [phone]): At least 1 percent but less than 20 percent impaired, limited or restricted    Time: 0955-1018 PT Time Calculation (min): 23 min   Charges:   PT Evaluation $Initial PT Evaluation Tier I: 1 Procedure PT Treatments $Gait Training: 8-22 mins $Therapeutic Activity: 8-22 mins   PT G Codes:   Functional Assessment Tool Used: clinical judgement Functional Limitation: Mobility: Walking and moving around    [employer], MPT Pager: [phone]

## 2013-11-04 NOTE — Progress Notes (Signed)
Clinical Social Work Department BRIEF PSYCHOSOCIAL ASSESSMENT 11/04/2013  Patient:  Nancy Meyer, Nancy Meyer     Account Number:  [id number]     Admit date:  11/03/2013  Clinical Social Worker:  Ulyess Blossom  Date/Time:  11/04/2013 01:19 PM  Referred by:  Physician  Date Referred:  11/04/2013 Referred for  SNF Placement   Other Referral:   Interview type:  Patient Other interview type:   and patient family at bedside    PSYCHOSOCIAL DATA Living Status:  ALONE Admitted from facility:   Level of care:   Primary support name:  Pam Majerus/daughter/[phone] Primary support relationship to patient:  CHILD, ADULT Degree of support available:   strong    CURRENT CONCERNS Current Concerns  Post-Acute Placement   Other Concerns:    SOCIAL WORK ASSESSMENT / PLAN CSW received referral for New SNF.    CSW met with pt and pt daughter and pt son-in-law at bedside. CSW introduced self and explained role. Pt shared that she lives at home alone, but has been having a difficult time since her compression fracture with getting around her home. CSW discussed PT recommendation for short term rehab at [facility]. Pt expressed understanding and stated that PT had discussed with her the recommendation. Pt agreeable to [facility] search. Pt expresessed interest in [facility] and CSW explained that [facility] is not in contract with pt insurance [employer]. Pt disappointed by this, but hopeful that a facility near her home will be able to accept her. Support provided.    Pt family expressed understanding about plan and recognize that pt unsafe to return home.    CSW completed FL2 and initiated SNF search to [facility].    Pt with [employer] and will require authorization prior to discharge.    CSW to follow up with pt and pt family re: SNF bed offers.    CSW to continue to follow to assist with pt disposition needs.   Assessment/plan status:  Psychosocial Support/Ongoing  Assessment of Needs Other assessment/ plan:   discharge planning   Information/referral to community resources:   [facility] list    PATIENT'S/FAMILY'S RESPONSE TO PLAN OF CARE: Pt alert and oriented x 4. Pt pleasant and actively engaged in conversation. Pt expressed that she had a compression fracture back in 2012 and went to rehab at [facility] at that time, but hopeful for a facility closer to home.    [name], MSW, [place] Work [phone]

## 2013-11-04 NOTE — Progress Notes (Signed)
[facility] PROGRESS NOTE  Nancy Meyer RUE:[id number] DOB: 08-08-1920 DOA: 11/03/2013 PCP: Pearson Grippe, MD  Assessment/Plan: Acute on chr low back pain  secondary to L4 compression fracture. Has some radiation to LLE.  Pain control with low dose dilaudid 0.5 mg q4 hr prn. Will order robaxin for muscle spasms and lidoderm patch. PT eval. Will need SNF D/w Dr Newell Coral who reviewed the images and recommended no role for sx. Recommends bed rest for 5-7 days . Aspen quick draw  lumbar brace when out of bed with abdominal RAP when out of bed. Will ask ortho tech to arrange for it. Needs DVT prophylaxis while bedbound. Dr Jule Ser will see pt in 4 weeks. Add bowel regimen   HTN  resume home meds  hyponatremia  improving with fluids  Elevated blood glucose No hx of DM. Monitor   Code Status: full Family Communication:daughter at bedside  Disposition Plan: SNF   Consultants:  Spoke with Dr Jule Ser on the phone  Procedures:  none  Antibiotics:  none  HPI/Subjective: Seen and examined  reports severe LB pain  Objective: Filed Vitals:   11/04/13 1325  BP: 142/73  Pulse: 98  Temp: 97.8 F (36.6 C)  Resp: 16    Intake/Output Summary (Last 24 hours) at 11/04/13 1633 Last data filed at 11/04/13 1437  Gross per 24 hour  Intake 1572.5 ml  Output    700 ml  Net  872.5 ml   Filed Weights   11/03/13 2340  Weight: 44.1 kg (97 lb 3.6 oz)    Exam:   General: elderly female  in some distress with pain  HEENT: no pallor, moist mucosa  chest: clear b/l  CVS: N S1&S2, no murmurs  Abd: soft, NT, N,D BS+  Ext: low back tenderness  CNS: alert and oriented.  Data Reviewed: Basic Metabolic Panel:  Recent Labs Lab 11/02/13 2344 11/03/13 1402 11/04/13 0500  NA 131* 132* 134*  K 4.2 3.9 4.0  CL 93* 95* 99  CO2 26 23 25   GLUCOSE 123* 136* 106*  BUN 18 13 13   CREATININE 0.68 0.61 0.65  CALCIUM 9.6 9.1 8.9   Liver Function Tests:  Recent  Labs Lab 11/04/13 0500  AST 15  ALT 11  ALKPHOS 48  BILITOT 0.5  PROT 5.9*  ALBUMIN 3.2*   No results found for this basename: LIPASE, AMYLASE,  in the last 168 hours No results found for this basename: AMMONIA,  in the last 168 hours CBC:  Recent Labs Lab 11/02/13 2344 11/03/13 1402 11/04/13 0500  WBC 8.1 10.6* 7.4  NEUTROABS 6.2  --   --   HGB 12.3 11.8* 10.8*  HCT 36.8 33.9* 31.4*  MCV 95.1 93.9 93.7  PLT 231 243 204   Cardiac Enzymes: No results found for this basename: CKTOTAL, CKMB, CKMBINDEX, TROPONINI,  in the last 168 hours BNP (last 3 results) No results found for this basename: PROBNP,  in the last 8760 hours CBG:  Recent Labs Lab 11/04/13 0753  GLUCAP 108*    No results found for this or any previous visit (from the past 240 hour(s)).   Studies: Dg Lumbar Spine Complete  11/03/2013   CLINICAL DATA:  Worsening back pain radiating to left leg.  EXAM: LUMBAR SPINE - COMPLETE 4+ VIEW  COMPARISON:  Lumbar spine radiographs Aug 20, 2012  FINDINGS: Moderate to severe L4 compression fracture, with progressed height loss. Remaining lumbar vertebra appear intact. Severe upper lumbar broad levoscoliosis. Maintenance of lumbar lordosis. Severe  L2-3 through L5-S1 disc degeneration stent with multilevel severe facet arthropathy. Bone mineral density is decreased without destructive bony lesions. No definite pars interarticularis defects though scoliosis limits evaluation.  Moderate calcific atherosclerosis of the aorta.  IMPRESSION: Moderate-to-severe chronic L4 compression/insufficiency fracture, mild progressed height loss. No malalignment. Severe broad levoscoliosis, similar.  Osteopenia, which decreases sensitivity for acute nondisplaced fractures.   Electronically Signed   By: Awilda Metroourtnay  Bloomer   On: 11/03/2013 00:46   Mr Lumbar Spine Wo Contrast  11/03/2013   CLINICAL DATA:  Low back pain radiating into the left leg. Compression fracture shown on recent radiographs.   EXAM: MRI LUMBAR SPINE WITHOUT CONTRAST  TECHNIQUE: Multiplanar, multisequence MR imaging of the lumbar spine was performed. No intravenous contrast was administered.  COMPARISON:  Multiple exams, including 11/02/2013 and 04/04/2011  FINDINGS: Considerable lumbar scoliosis noted with levoconvex upper an dextroconvex lower components and significant rotary component. Distended gallbladder.  T2 hyperintense intervertebral disc at L4-5. Inferior endplate compression fracture at L4. There is an old superior endplate compression fracture at L4 and an old inferior endplate compression fracture at L3.  The conus medullaris appears normal.  Conus level:  L1.  Additional findings at individual levels are as follows:  L1-2: Borderline right subarticular lateral recess stenosis due to disc bulge.  L2-3: Mild to moderate right subarticular lateral recess stenosis and borderline right foraminal stenosis due to intervertebral spurring, disc bulge, and facet arthropathy.  L3-4: Moderate to prominent central stenosis with moderate to prominent bilateral foraminal stenosis and moderate bilateral subarticular lateral recess stenosis due to posterior osseous ridging, facet arthropathy, ligamentum flavum redundancy, and likely some degree of chronic posterior bony retropulsion from L3.  L4-5: Prominent central narrowing of the thecal sac with prominent left and mild right foraminal stenosis as well as prominent left and moderate right subarticular lateral recess stenosis due to facet arthropathy, intervertebral spurring, disc bulge, left foraminal disc protrusion, and potentially minimal bony retropulsion.  L5-S1: Moderate left foraminal stenosis and mild left subarticular lateral recess stenosis due to left greater than right facet arthropathy, disc bulge, and left foraminal disc protrusion.  IMPRESSION: 1. New inferior endplate compression fracture at L4. The T2 hyperintensity in the L4-5 intervertebral disc is probably related to  the subacute fracture and less likely to be due to discitis. 2. Lumbar spondylosis and degenerative disc disease, causing prominent impingement at L4-5; moderate to prominent impingement at L3-4 ; moderate impingement at L5-S1; and mild to moderate impingement at L2-3, as detailed above. 3. Considerable lumbar scoliosis with rotary component.   Electronically Signed   By: Herbie BaltimoreWalt  Liebkemann M.D.   On: 11/03/2013 16:51    Scheduled Meds: . aspirin EC  81 mg Oral Daily  . buPROPion  150 mg Oral Daily  . calcium-vitamin D  1 tablet Oral Daily  . cholecalciferol  1,000 Units Oral Daily  . docusate sodium  100 mg Oral BID  . folic acid  1 mg Oral Daily  . heparin  5,000 Units Subcutaneous 3 times per day  . lidocaine  1 patch Transdermal Q24H  . losartan  100 mg Oral Daily  . multivitamin with minerals  1 tablet Oral Daily  . thiamine  100 mg Oral Daily   Continuous Infusions: . sodium chloride 75 mL/hr at 11/04/13 1302       Time spent: 25 minutes    Bethani Brugger  [facility] Pager [phone]. If 7PM-7AM, please contact night-coverage at [email or web address], password [email or web address] 11/04/2013, 4:33 PM  LOS: 1  day

## 2013-11-04 NOTE — Care Management Note (Signed)
    Page 1 of 1   11/04/2013     2:57:07 PM CARE MANAGEMENT NOTE 11/04/2013  Patient:  Nancy Meyer,Nancy Meyer   Account Number:  [id number]  Date Initiated:  11/04/2013  Documentation initiated by:  [facility] Hospital[facility]FFRIES,Jamus Loving  Subjective/Objective Assessment:   adm: back pain     Action/Plan:   SNF   Anticipated DC Date:  11/05/2013   Anticipated DC Plan:  SKILLED NURSING FACILITY      DC Planning Services  CM consult      Choice offered to / List presented to:             Status of service:  Completed, signed off Medicare Important Message given?  NA - LOS <3 / Initial given by admissions (If response is "NO", the following Medicare IM given date fields will be blank) Date Medicare IM given:  11/03/2013 Medicare IM given by:   Date Additional Medicare IM given:   Additional Medicare IM given by:    Discharge Disposition:  SKILLED NURSING FACILITY  Per UR Regulation:    If discussed at Long Length of Stay Meetings, dates discussed:    Comments:  11/04/13 14:45 CM spoke with CSW who is arranging SNF for pt.  CM called MD to see if pt is ready for discharge if bed is secured at a SNF; MD states not today.  CM called CSW who states she has secured a SNF bed available tomorrow.  No other CM needs were communicated.  Pt will be followed by CSW.  Nancy Meyer, BSN, CM [phone].

## 2013-11-04 NOTE — Progress Notes (Signed)
INITIAL NUTRITION ASSESSMENT  DOCUMENTATION CODES Per approved criteria  -Underweight   INTERVENTION: -Recommend Carnation Instant Breakfast BID -Recommend liberalized diet  -Encouraged intake of high protein/kcal snacks -Will continue to monitor  NUTRITION DIAGNOSIS: Increased nutrient needs (protein/kcal) related to increased demand for nutrients as evidenced by BMI <18.5, hx of weight los.   Goal: Pt to meet >/= 90% of their estimated nutrition needs    Monitor:  Total protein/energy intake, labs, weights  Reason for Assessment: MST/Underweight BMI  80 y.o. female  Admitting Dx: Compression fracture  ASSESSMENT: Nancy Meyer is a 80 y.o. female with chronic back pain presents to the ED with worsening of her back pain  -Pt endorsed an unintentional wt loss of 13 lbs in past two years (12% body weight, non-significant for time frame) -Reported back pain often inhibits PO intake; however she tries to eat three meals/daily. Complies largely to a heart healthy diet; consumes milk, chicken, and fruits and vegetables. Does not drink supplements as she finds them too sweet -Pt was willing to trial [employer] Instant Breakfast as pt regularly drinks 12-16 oz milk/daily, and would benefit from nutrient replenishment  -Current PO intake approximately 50%. Had some questions regarding diet restrictions. Pt admitted with hyperglycemia; however does not have hx of DM2. Would benefit from diet liberalization d/t hx of weight loss, underweight status and advanced age   Height: Ht Readings from Last 1 Encounters:  11/03/13 5\' 3"  (1.6 m)    Weight: Wt Readings from Last 1 Encounters:  11/03/13 97 lb 3.6 oz (44.1 kg)    Ideal Body Weight: 115 lbs  % Ideal Body Weight: 84%  Wt Readings from Last 10 Encounters:  11/03/13 97 lb 3.6 oz (44.1 kg)  08/09/11 97 lb 6.4 oz (44.18 kg)  02/28/11 108 lb (48.988 kg)    Usual Body Weight: 110 lb  % Usual Body Weight: 88%  BMI:   Body mass index is 17.23 kg/(m^2). Underweight  Estimated Nutritional Needs: Kcal: 1300-1500 Protein: 55-65 gram Fluid: >/=1300 ml/daily  Skin: WDL  Diet Order:    EDUCATION NEEDS: -No education needs identified at this time   Intake/Output Summary (Last 24 hours) at 11/04/13 1517 Last data filed at 11/04/13 1437  Gross per 24 hour  Intake 1572.5 ml  Output    700 ml  Net  872.5 ml    Last BM: 8/10   Labs:   Recent Labs Lab 11/02/13 2344 11/03/13 1402 11/04/13 0500  NA 131* 132* 134*  K 4.2 3.9 4.0  CL 93* 95* 99  CO2 26 23 25   BUN 18 13 13   CREATININE 0.68 0.61 0.65  CALCIUM 9.6 9.1 8.9  GLUCOSE 123* 136* 106*    CBG (last 3)   Recent Labs  11/04/13 0753  GLUCAP 108*    Scheduled Meds: . aspirin EC  81 mg Oral Daily  . buPROPion  150 mg Oral Daily  . calcium-vitamin D  1 tablet Oral Daily  . cholecalciferol  1,000 Units Oral Daily  . docusate sodium  100 mg Oral BID  . folic acid  1 mg Oral Daily  . heparin  5,000 Units Subcutaneous 3 times per day  . lidocaine  1 patch Transdermal Q24H  . losartan  100 mg Oral Daily  . multivitamin with minerals  1 tablet Oral Daily  . thiamine  100 mg Oral Daily    Continuous Infusions: . sodium chloride 75 mL/hr at 11/04/13 1302    Past Medical History  Diagnosis Date  . Hypertension   . Osteoporosis   . Macular degeneration   . Arthritis   . Hyperlipidemia     Past Surgical History  Procedure Laterality Date  . Appendectomy    . Eye surgery      Lloyd Huger MS RD LDN Clinical Dietitian Pager:[phone]

## 2013-11-04 NOTE — Progress Notes (Signed)
UR Completed Jayzen Paver Graves-Bigelow, RN,BSN [phone]  

## 2013-11-04 NOTE — Progress Notes (Addendum)
Clinical Social Work Department CLINICAL SOCIAL WORK PLACEMENT NOTE 11/04/2013  Patient:  Nancy Meyer,[name]  Account Number:  [id number] Admit date:  11/03/2013  Clinical Social Worker:  Jacelyn GripSUZANNA BYRD, LCSWA  Date/time:  11/04/2013 01:25 PM  Clinical Social Work is seeking post-discharge placement for this patient at the following level of care:   SKILLED NURSING   (*CSW will update this form in Epic as items are completed)   11/04/2013  [facility]/family provided with [facility] Department of Clinical Social Work's list of facilities offering this level of care within the geographic area requested by the patient (or if unable, by the patient's family).  11/04/2013  Patient/family informed of their freedom to choose among providers that offer the needed level of care, that participate in Medicare, Medicaid or managed care program needed by the patient, have an available bed and are willing to accept the patient.  11/04/2013  Patient/family informed of [facility]' ownership interest in [facility], as well as of the fact that they are under no obligation to receive care at this facility.  PASARR submitted to EDS on 11/04/2013 PASARR number received on 11/04/2013  FL2 transmitted to all facilities in geographic area requested by pt/family on  11/04/2013 FL2 transmitted to all facilities within larger geographic area on   Patient informed that his/her managed care company has contracts with or will negotiate with  certain facilities, including the following:     Patient/family informed of bed offers received:  11[facility]05/2013 [facility] and patient chooses bed at    [facility] be [facility] Patient to be transferred to facility by ambulance Sharin Mons(PTAR) Patient and family notified of transfer on 11/05/2013 Name of family member notified:  Pt notified at bedside, pt daughter, Pam notified via  telephone  The following physician request were entered in Epic:   Additional Comments:   Loletta SpecterSuzanna Kidd, MSW, LCSW Clinical Social Work [phone]

## 2013-11-04 NOTE — Progress Notes (Signed)
Notified Cathy from [employer] to set up an Sara Leespen quick draw lumbar brace with abdominal RAP per Dr. Valora Piccolohungel's order.- Hulda Marinonna Jim Lundin RN

## 2013-11-05 DIAGNOSIS — IMO0002 Reserved for concepts with insufficient information to code with codable children: Secondary | ICD-10-CM

## 2013-11-05 DIAGNOSIS — S32040A Wedge compression fracture of fourth lumbar vertebra, initial encounter for closed fracture: Secondary | ICD-10-CM | POA: Diagnosis present

## 2013-11-05 LAB — GLUCOSE, CAPILLARY: Glucose-Capillary: 111 mg/dL — ABNORMAL HIGH (ref 70–99)

## 2013-11-05 LAB — VITAMIN D 25 HYDROXY (VIT D DEFICIENCY, FRACTURES): Vit D, 25-Hydroxy: 44 ng/mL (ref 30–89)

## 2013-11-05 MED ORDER — TRAMADOL-ACETAMINOPHEN 37.5-325 MG PO TABS: 2.0000 | ORAL_TABLET | Freq: Four times a day (QID) | ORAL | Status: DC

## 2013-11-05 MED ORDER — ENOXAPARIN SODIUM 40 MG/0.4ML ~~LOC~~ SOLN: 40.0000 mg | SUBCUTANEOUS | Status: DC

## 2013-11-05 MED ORDER — IBUPROFEN 200 MG PO TABS
600.0000 mg | ORAL_TABLET | Freq: Four times a day (QID) | ORAL | Status: DC | PRN
  Administered 2013-11-05: 600 mg via ORAL
  Filled 2013-11-05: qty 3

## 2013-11-05 MED ORDER — TRAMADOL-ACETAMINOPHEN 37.5-325 MG PO TABS
2.0000 | ORAL_TABLET | Freq: Four times a day (QID) | ORAL | Status: DC
  Administered 2013-11-05: 2 via ORAL
  Filled 2013-11-05: qty 2

## 2013-11-05 MED ORDER — DSS 100 MG PO CAPS: 100.0000 mg | ORAL_CAPSULE | Freq: Two times a day (BID) | ORAL | Status: DC

## 2013-11-05 MED ORDER — MORPHINE SULFATE 15 MG PO TABS: 7.5000 mg | ORAL_TABLET | Freq: Four times a day (QID) | ORAL | Status: DC | PRN

## 2013-11-05 MED ORDER — LIDOCAINE 5 % EX PTCH: 1.0000 | MEDICATED_PATCH | CUTANEOUS | Status: DC

## 2013-11-05 NOTE — Progress Notes (Signed)
CSW continuing to follow for disposition planning.  CSW met with pt and pt son regarding SNF bed offers.  Pt and pt family choose [facility].  CSW contacted facility and confirmed bed availability for today.  CSW spoke with pt insurance, [employer] and received insurance authorization for SNF placement.  MD notified.  CSW to facilitate pt discharge needs this afternoon.  [name], MSW, [place] Work [phone]

## 2013-11-05 NOTE — Progress Notes (Signed)
UR Completed Darlyn Repsher Graves-Bigelow, RN,BSN [phone]  

## 2013-11-05 NOTE — Discharge Summary (Signed)
Physician Discharge Summary  Nancy Meyer HQI:[id number] DOB: 10-Jan-1921 DOA: 11/03/2013  PCP: Pearson Grippe, MD  Admit date: 11/03/2013 Discharge date: 11/05/2013  Time spent: 25 minutes  Recommendations for Outpatient Follow-up:  1. Discharge to [facility] 2. Patient should be on maximum  bedrest for 5-7 days and start PT thereafter. Aspen lumbar brace to be applied while out of bed. 3. Follow up with Dr Newell Coral on 12/04/2013 at 3:15 pm  Discharge Diagnoses:  Principal Problem:   Compression fracture of L4 lumbar vertebra  Active Problems:   Osteoporosis   HTN (hypertension)   Hyperlipidemia   Hyponatremia   Hyperglycemia    Discharge Condition: Fair  Diet recommendation: regular  Filed Weights   11/03/13 2340 11/04/13 1654  Weight: 44.1 kg (97 lb 3.6 oz) 42.6 kg (93 lb 14.7 oz)    History of present illness:  Please refer to admission H&P for details, but in brief, 80 y.o. female with chronic back pain presented to the ED with worsening of her back pain for past few days. She denies having any fall or recent injury. She states that she had noted increased pain and could not even get around in her home. She has no one who can care for her at home. She states that she was seen was seen in ED 1 day before for the same complaints. She had an appointment scheduled with neurosurgery on 8/14 but she couldnt tolerate the severity of her pain. She states that she has had some weakness in her left foot.   In the ED she had MRI of her lumbar spine which showed compression fracture of L4 without any cord involvement. Admitted to hospitalist service under observation.   Hospital Course:  Acute on chr low back pain  secondary to L4 compression fracture. Has some radiation to LLE.  Required  low dose dilaudid 0.5 mg q4 hr prn in the hospital for pain control as oxycodone was not effective. . Placed lidoderm patch. Patient hemodynamically stable.  i will discharge her on  scheduled tramadol and naprosyn. Will also add prn MSIR 7.5mg  q 6hr which can be titrated as outpt.Seen by PT and recommend SNF . D/w Dr Newell Coral who reviewed the images and recommended no role for sx. Recommends strict bed rest for 5-7 days . Aspen quick draw lumbar brace when out of bed with abdominal RAP when out of bed and participating with PT. This has been arranged. Needs DVT prophylaxis with sq lovenox  while bedbound for another 5-7 days. Dr Jule Ser will see pt in 4 weeks.  Added bowel regimen.   HTN  Stable on home meds   hyponatremia  Improved with fluids  Code Status: full  Family Communication:son at bedside   Disposition Plan: SNF   Consultants:  Spoke with Dr Jule Ser on the phone on 8/12   Procedures:  None   Antibiotics:  none   Discharge Exam: Filed Vitals:   11/05/13 0458  BP: 148/81  Pulse: 88  Temp: 97.7 F (36.5 C)  Resp: 16   General: elderly female in some distress with pain  HEENT: no pallor, moist mucosa  chest: clear b/l  CVS: N S1&S2, no murmurs  Abd: soft, NT, N,D BS+  Ext: low back tenderness on pressure, pain on minimal movement  CNS: alert and oriented.    Discharge Instructions You were cared for by a hospitalist during your hospital stay. If you have any questions about your discharge medications or the care you received  while you were in the hospital after you are discharged, you can call the unit and asked to speak with the hospitalist on call if the hospitalist that took care of you is not available. Once you are discharged, your primary care physician will handle any further medical issues. Please note that NO REFILLS for any discharge medications will be authorized once you are discharged, as it is imperative that you return to your primary care physician (or establish a relationship with a primary care physician if you do not have one) for your aftercare needs so that they can reassess your need for medications and monitor your  lab values.     Medication List    STOP taking these medications       oxyCODONE-acetaminophen 5-325 MG per tablet  Commonly known as:  PERCOCET/ROXICET      TAKE these medications       bisacodyl 5 MG EC tablet  Commonly known as:  DULCOLAX  Take 5 mg by mouth daily as needed for mild constipation.     buPROPion 150 MG 12 hr tablet  Commonly known as:  WELLBUTRIN SR  Take 150 mg by mouth daily.     calcium citrate-vitamin D 200-200 MG-UNIT Tabs  Take 1 tablet by mouth daily.     cholecalciferol 1000 UNITS tablet  Commonly known as:  VITAMIN D  Take 1,000 Units by mouth every morning.     DSS 100 MG Caps  Take 100 mg by mouth 2 (two) times daily.     enoxaparin 40 MG/0.4ML injection  Commonly known as:  LOVENOX  Inject 0.4 mLs (40 mg total) into the skin daily.     lidocaine 5 %  Commonly known as:  LIDODERM  Place 1 patch onto the skin daily. Remove & Discard patch within 12 hours or as directed by MD     losartan 100 MG tablet  Commonly known as:  COZAAR  Take 100 mg by mouth daily.     magnesium hydroxide 311 MG Chew chewable tablet  Commonly known as:  PHILLIPS CHEWS  Chew 622 mg by mouth every 4 (four) hours as needed (constipation).     morphine 15 MG tablet  Commonly known as:  MSIR  Take 0.5 tablets (7.5 mg total) by mouth every 6 (six) hours as needed for severe pain.     naproxen sodium 220 MG tablet  Commonly known as:  ANAPROX  Take 440 mg by mouth 2 (two) times daily as needed (pain).     PRESERVISION AREDS 2 PO  Take 2 tablets by mouth daily.     traMADol-acetaminophen 37.5-325 MG per tablet  Commonly known as:  ULTRACET  Take 2 tablets by mouth every 6 (six) hours.       No Known Allergies     Follow-up Information   Please follow up. (follow up with MD at [facility])        The results of significant diagnostics from this hospitalization (including imaging, microbiology, ancillary and laboratory) are listed below for reference.     Significant Diagnostic Studies: Dg Lumbar Spine Complete  11/03/2013   CLINICAL DATA:  Worsening back pain radiating to left leg.  EXAM: LUMBAR SPINE - COMPLETE 4+ VIEW  COMPARISON:  Lumbar spine radiographs Aug 20, 2012  FINDINGS: Moderate to severe L4 compression fracture, with progressed height loss. Remaining lumbar vertebra appear intact. Severe upper lumbar broad levoscoliosis. Maintenance of lumbar lordosis. Severe L2-3 through L5-S1 disc degeneration stent with multilevel severe  facet arthropathy. Bone mineral density is decreased without destructive bony lesions. No definite pars interarticularis defects though scoliosis limits evaluation.  Moderate calcific atherosclerosis of the aorta.  IMPRESSION: Moderate-to-severe chronic L4 compression/insufficiency fracture, mild progressed height loss. No malalignment. Severe broad levoscoliosis, similar.  Osteopenia, which decreases sensitivity for acute nondisplaced fractures.   Electronically Signed   By: Awilda Metroourtnay  Bloomer   On: 11/03/2013 00:46   Mr Lumbar Spine Wo Contrast  11/03/2013   CLINICAL DATA:  Low back pain radiating into the left leg. Compression fracture shown on recent radiographs.  EXAM: MRI LUMBAR SPINE WITHOUT CONTRAST  TECHNIQUE: Multiplanar, multisequence MR imaging of the lumbar spine was performed. No intravenous contrast was administered.  COMPARISON:  Multiple exams, including 11/02/2013 and 04/04/2011  FINDINGS: Considerable lumbar scoliosis noted with levoconvex upper an dextroconvex lower components and significant rotary component. Distended gallbladder.  T2 hyperintense intervertebral disc at L4-5. Inferior endplate compression fracture at L4. There is an old superior endplate compression fracture at L4 and an old inferior endplate compression fracture at L3.  The conus medullaris appears normal.  Conus level:  L1.  Additional findings at individual levels are as follows:  L1-2: Borderline right subarticular lateral recess  stenosis due to disc bulge.  L2-3: Mild to moderate right subarticular lateral recess stenosis and borderline right foraminal stenosis due to intervertebral spurring, disc bulge, and facet arthropathy.  L3-4: Moderate to prominent central stenosis with moderate to prominent bilateral foraminal stenosis and moderate bilateral subarticular lateral recess stenosis due to posterior osseous ridging, facet arthropathy, ligamentum flavum redundancy, and likely some degree of chronic posterior bony retropulsion from L3.  L4-5: Prominent central narrowing of the thecal sac with prominent left and mild right foraminal stenosis as well as prominent left and moderate right subarticular lateral recess stenosis due to facet arthropathy, intervertebral spurring, disc bulge, left foraminal disc protrusion, and potentially minimal bony retropulsion.  L5-S1: Moderate left foraminal stenosis and mild left subarticular lateral recess stenosis due to left greater than right facet arthropathy, disc bulge, and left foraminal disc protrusion.  IMPRESSION: 1. New inferior endplate compression fracture at L4. The T2 hyperintensity in the L4-5 intervertebral disc is probably related to the subacute fracture and less likely to be due to discitis. 2. Lumbar spondylosis and degenerative disc disease, causing prominent impingement at L4-5; moderate to prominent impingement at L3-4 ; moderate impingement at L5-S1; and mild to moderate impingement at L2-3, as detailed above. 3. Considerable lumbar scoliosis with rotary component.   Electronically Signed   By: Herbie BaltimoreWalt  Liebkemann M.D.   On: 11/03/2013 16:51    Microbiology: No results found for this or any previous visit (from the past 240 hour(s)).   Labs: Basic Metabolic Panel:  Recent Labs Lab 11/02/13 2344 11/03/13 1402 11/04/13 0500  NA 131* 132* 134*  K 4.2 3.9 4.0  CL 93* 95* 99  CO2 26 23 25   GLUCOSE 123* 136* 106*  BUN 18 13 13   CREATININE 0.68 0.61 0.65  CALCIUM 9.6 9.1  8.9   Liver Function Tests:  Recent Labs Lab 11/04/13 0500  AST 15  ALT 11  ALKPHOS 48  BILITOT 0.5  PROT 5.9*  ALBUMIN 3.2*   No results found for this basename: LIPASE, AMYLASE,  in the last 168 hours No results found for this basename: AMMONIA,  in the last 168 hours CBC:  Recent Labs Lab 11/02/13 2344 11/03/13 1402 11/04/13 0500  WBC 8.1 10.6* 7.4  NEUTROABS 6.2  --   --  HGB 12.3 11.8* 10.8*  HCT 36.8 33.9* 31.4*  MCV 95.1 93.9 93.7  PLT 231 243 204   Cardiac Enzymes: No results found for this basename: CKTOTAL, CKMB, CKMBINDEX, TROPONINI,  in the last 168 hours BNP: BNP (last 3 results) No results found for this basename: PROBNP,  in the last 8760 hours CBG:  Recent Labs Lab 11/04/13 0753 11/05/13 0721  GLUCAP 108* 111*       Signed:  Emiliana Blaize  [facility] 11/05/2013, 12:17 PM

## 2013-11-05 NOTE — Progress Notes (Addendum)
[facility].  CSW facilitated pt discharge needs including contacting facility, faxing pt discharge information via [facility], [facility], discussing with pt at bedside, and pt daughter via telephone, providing RN phone number to call report, and arranging [facility].  Pt coping appropriately with transition to [facility]. Pt eager to get stronger and return home.   No further social work needs identified at this time.  CSW signing off.   Loletta SpecterSuzanna Kaylah Chiasson, MSW, LCSW Clinical Social Work [phone]

## 2013-11-05 NOTE — Plan of Care (Signed)
Problem: Phase III Progression Outcomes Goal: Voiding independently Outcome: Adequate for Discharge Patient log rolling in the bed to use the bedpan with assistance.

## 2013-11-05 NOTE — Progress Notes (Signed)
[facility] to be [facility].  Report called to Nettie ElmSylvia, Charity fundraiserN.  Allayne ButcherMiller, Waddell Iten [facility]  11/05/2013  2:21 PM

## 2013-11-06 ENCOUNTER — Non-Acute Institutional Stay (SKILLED_NURSING_FACILITY): Payer: Medicare Other | Admitting: Adult Health

## 2013-11-06 ENCOUNTER — Encounter: Payer: Self-pay | Admitting: Adult Health

## 2013-11-06 DIAGNOSIS — F329 Major depressive disorder, single episode, unspecified: Secondary | ICD-10-CM | POA: Insufficient documentation

## 2013-11-06 DIAGNOSIS — F3289 Other specified depressive episodes: Secondary | ICD-10-CM

## 2013-11-06 DIAGNOSIS — S32040D Wedge compression fracture of fourth lumbar vertebra, subsequent encounter for fracture with routine healing: Secondary | ICD-10-CM

## 2013-11-06 DIAGNOSIS — K5900 Constipation, unspecified: Secondary | ICD-10-CM | POA: Insufficient documentation

## 2013-11-06 DIAGNOSIS — M810 Age-related osteoporosis without current pathological fracture: Secondary | ICD-10-CM

## 2013-11-06 DIAGNOSIS — F32A Depression, unspecified: Secondary | ICD-10-CM

## 2013-11-06 DIAGNOSIS — IMO0002 Reserved for concepts with insufficient information to code with codable children: Secondary | ICD-10-CM

## 2013-11-06 DIAGNOSIS — I10 Essential (primary) hypertension: Secondary | ICD-10-CM

## 2013-11-06 NOTE — Progress Notes (Signed)
Patient ID: KENISHIA PLACK, female   DOB: 1920/04/01, 80 y.o.   MRN: [id number]               PROGRESS NOTE  DATE: 11/06/2013  FACILITY: Nursing Home Location: [facility]  LEVEL OF CARE: SNF (31)  Acute Visit  CHIEF COMPLAINT:  Follow-up Hospitalization  HISTORY OF PRESENT ILLNESS: This is a 80 year old female who has been admitted to [facility] on 11/05/13 from [facility] with Compression fracture of L4 lumbar vertebrae. Neurosurgery recommended no surgery at this time. Patient has been admitted for a short-term rehabilitation.  REASSESSMENT OF ONGOING PROBLEM(S):  HTN: Pt 's HTN remains stable.  Denies CP, sob, DOE, pedal edema, headaches, dizziness or visual disturbances.  No complications from the medications currently being used.  Last BP : 152/89  OSTEOPOROSIS: The patient complains of back pain radiating to LLE.Marland Kitchen There has  been evidence of L4 fracture. No complications noted from the medications presently being used.  DEPRESSION: The depression remains stable. Patient denies ongoing feelings of sadness, insomnia, anedhonia or lack of appetite. No complications reported from the medications currently being used. Staff do not report behavioral problems.  PAST MEDICAL HISTORY : Reviewed.  No changes/see problem list  CURRENT MEDICATIONS: Reviewed per MAR/see medication list  REVIEW OF SYSTEMS:  GENERAL: no change in appetite, no fatigue, no weight changes, no fever, chills or weakness RESPIRATORY: no cough, SOB, DOE, wheezing, hemoptysis CARDIAC: no chest pain, edema or palpitations GI: no abdominal pain, diarrhea, heart burn, nausea or vomiting, + constipation  PHYSICAL EXAMINATION  GENERAL: no acute distress, normal body habitus EYES: conjunctivae normal, sclerae normal, normal eye lids NECK: supple, trachea midline, no neck masses, no thyroid tenderness, no thyromegaly LYMPHATICS: no LAN in the neck, no supraclavicular  LAN RESPIRATORY: breathing is even & unlabored, BS CTAB CARDIAC: RRR, no murmur,no extra heart sounds, no edema GI: abdomen soft, normal BS, no masses, no tenderness, no hepatomegaly, no splenomegaly EXTREMITIES:  Able to move all 4 extremities PSYCHIATRIC: the patient is alert & oriented to person, affect & behavior appropriate  LABS/RADIOLOGY: Labs reviewed: Basic Metabolic Panel:  Recent Labs  40/98/11 2344 11/03/13 1402 11/04/13 0500  NA 131* 132* 134*  K 4.2 3.9 4.0  CL 93* 95* 99  CO2 26 23 25   GLUCOSE 123* 136* 106*  BUN 18 13 13   CREATININE 0.68 0.61 0.65  CALCIUM 9.6 9.1 8.9   Liver Function Tests:  Recent Labs  11/04/13 0500  AST 15  ALT 11  ALKPHOS 48  BILITOT 0.5  PROT 5.9*  ALBUMIN 3.2*   CBC:  Recent Labs  11/02/13 2344 11/03/13 1402 11/04/13 0500  WBC 8.1 10.6* 7.4  NEUTROABS 6.2  --   --   HGB 12.3 11.8* 10.8*  HCT 36.8 33.9* 31.4*  MCV 95.1 93.9 93.7  PLT 231 243 204   CBG:  Recent Labs  11/04/13 0753 11/05/13 0721  GLUCAP 108* 111*    EXAM: LUMBAR SPINE - COMPLETE 4+ VIEW   COMPARISON:  Lumbar spine radiographs Aug 20, 2012   FINDINGS: Moderate to severe L4 compression fracture, with progressed height loss. Remaining lumbar vertebra appear intact. Severe upper lumbar broad levoscoliosis. Maintenance of lumbar lordosis. Severe L2-3 through L5-S1 disc degeneration stent with multilevel severe facet arthropathy. Bone mineral density is decreased without destructive bony lesions. No definite pars interarticularis defects though scoliosis limits evaluation.   Moderate calcific atherosclerosis of the aorta.   IMPRESSION: Moderate-to-severe chronic L4  compression/insufficiency fracture, mild progressed height loss. No malalignment. Severe broad levoscoliosis, similar.   Osteopenia, which decreases sensitivity for acute nondisplaced fractures. EXAM: MRI LUMBAR SPINE WITHOUT CONTRAST   TECHNIQUE: Multiplanar,  multisequence MR imaging of the lumbar spine was performed. No intravenous contrast was administered.   COMPARISON:  Multiple exams, including 11/02/2013 and 04/04/2011   FINDINGS: Considerable lumbar scoliosis noted with levoconvex upper an dextroconvex lower components and significant rotary component. Distended gallbladder.   T2 hyperintense intervertebral disc at L4-5. Inferior endplate compression fracture at L4. There is an old superior endplate compression fracture at L4 and an old inferior endplate compression fracture at L3.   The conus medullaris appears normal.  Conus level:  L1.   Additional findings at individual levels are as follows:   L1-2: Borderline right subarticular lateral recess stenosis due to disc bulge.   L2-3: Mild to moderate right subarticular lateral recess stenosis and borderline right foraminal stenosis due to intervertebral spurring, disc bulge, and facet arthropathy.   L3-4: Moderate to prominent central stenosis with moderate to prominent bilateral foraminal stenosis and moderate bilateral subarticular lateral recess stenosis due to posterior osseous ridging, facet arthropathy, ligamentum flavum redundancy, and likely some degree of chronic posterior bony retropulsion from L3.   L4-5: Prominent central narrowing of the thecal sac with prominent left and mild right foraminal stenosis as well as prominent left and moderate right subarticular lateral recess stenosis due to facet arthropathy, intervertebral spurring, disc bulge, left foraminal disc protrusion, and potentially minimal bony retropulsion.   L5-S1: Moderate left foraminal stenosis and mild left subarticular lateral recess stenosis due to left greater than right facet arthropathy, disc bulge, and left foraminal disc protrusion.   IMPRESSION: 1. New inferior endplate compression fracture at L4. The T2 hyperintensity in the L4-5 intervertebral disc is probably related to the  subacute fracture and less likely to be due to discitis. 2. Lumbar spondylosis and degenerative disc disease, causing prominent impingement at L4-5; moderate to prominent impingement at L3-4 ; moderate impingement at L5-S1; and mild to moderate impingement at L2-3, as detailed above. 3. Considerable lumbar scoliosis with rotary component.   ASSESSMENT/PLAN:  Compression fracture of L4 lumbar vertebra - for strict bed rest X 5-7 days; Lovenox injection X 7 days; Aspen lumbar brace when out of bed; continue Lidoderm patch, Ultracet PRN and MSIR PRN Osteoporosis - continue calcium supplementation Depression - stable; continue Wellbutrin Hypertension - monitor her BP every shift x1 week; continue Cozaar Constipation - continue Colace; start senna S2 tabs by mouth each bedtime; Fleet enema x1   CPT CODE: [id number]  Ella BodoMonina Vargas - NP [facility] [phone]

## 2013-11-10 ENCOUNTER — Non-Acute Institutional Stay (SKILLED_NURSING_FACILITY): Payer: Medicare Other | Admitting: Internal Medicine

## 2013-11-10 DIAGNOSIS — I10 Essential (primary) hypertension: Secondary | ICD-10-CM

## 2013-11-10 DIAGNOSIS — F3289 Other specified depressive episodes: Secondary | ICD-10-CM

## 2013-11-10 DIAGNOSIS — K5900 Constipation, unspecified: Secondary | ICD-10-CM

## 2013-11-10 DIAGNOSIS — F32A Depression, unspecified: Secondary | ICD-10-CM

## 2013-11-10 DIAGNOSIS — IMO0002 Reserved for concepts with insufficient information to code with codable children: Secondary | ICD-10-CM

## 2013-11-10 DIAGNOSIS — S32040D Wedge compression fracture of fourth lumbar vertebra, subsequent encounter for fracture with routine healing: Secondary | ICD-10-CM

## 2013-11-10 DIAGNOSIS — F329 Major depressive disorder, single episode, unspecified: Secondary | ICD-10-CM

## 2013-11-12 NOTE — Progress Notes (Signed)
HISTORY & PHYSICAL  DATE: 11/10/2013   FACILITY: [facility]  LEVEL OF CARE: SNF (31)  ALLERGIES:  No Known Allergies  CHIEF COMPLAINT:  Manage lumbar compression fracture, hypertension and constipation  HISTORY OF PRESENT ILLNESS: 80 year old Caucasian female was hospitalized secondary to acute low back pain. After hospitalization she is admitted to this facility for short-term rehabilitation.  COMPRESSION FX: The patient was found to have a L4 compression fracture. Patient denies back pain currently. No complications reported from the pain medications currently being used.  HTN: Pt 's HTN remains stable.  Denies CP, sob, DOE, pedal edema, headaches, dizziness or visual disturbances.  No complications from the medications currently being used.  Last BP : 122/89.  CONSTIPATION: New problem. Patient is complaining of not being able to have bowel movements. She denies abdominal pain, nausea or vomiting. She is currently not on a laxative.  PAST MEDICAL HISTORY :  Past Medical History  Diagnosis Date  . Hypertension   . Osteoporosis   . Macular degeneration   . Arthritis   . Hyperlipidemia     PAST SURGICAL HISTORY: Past Surgical History  Procedure Laterality Date  . Appendectomy    . Eye surgery      SOCIAL HISTORY:  reports that she has never smoked. She has never used smokeless tobacco. She reports that she does not drink alcohol or use illicit drugs.  FAMILY HISTORY:  Family History  Problem Relation Age of Onset  . Cancer Daughter     breast    CURRENT MEDICATIONS: Reviewed per MAR/see medication list  REVIEW OF SYSTEMS:  See HPI otherwise 14 point ROS is negative.  PHYSICAL EXAMINATION  VS:  See VS section  GENERAL: no acute distress, thin body habitus EYES: conjunctivae normal, sclerae normal, normal eye lids MOUTH/THROAT: lips without lesions,no lesions in the mouth,tongue is without lesions,uvula elevates in  midline NECK: supple, trachea midline, no neck masses, no thyroid tenderness, no thyromegaly LYMPHATICS: no LAN in the neck, no supraclavicular LAN RESPIRATORY: breathing is even & unlabored, BS CTAB CARDIAC: RRR, no murmur,no extra heart sounds, no edema GI:  ABDOMEN: abdomen soft, normal BS, no masses, no tenderness  LIVER/SPLEEN: no hepatomegaly, no splenomegaly MUSCULOSKELETAL: HEAD: normal to inspection  EXTREMITIES: LEFT UPPER EXTREMITY: full range of motion, normal strength & tone RIGHT UPPER EXTREMITY:  full range of motion, normal strength & tone LEFT LOWER EXTREMITY:  full range of motion, normal strength & tone RIGHT LOWER EXTREMITY:  full range of motion, normal strength & tone PSYCHIATRIC: the patient is alert & oriented to person, affect & behavior appropriate  LABS/RADIOLOGY:  Labs reviewed: Basic Metabolic Panel:  Recent Labs  16/10/96 2344 11/03/13 1402 11/04/13 0500  NA 131* 132* 134*  K 4.2 3.9 4.0  CL 93* 95* 99  CO2 26 23 25   GLUCOSE 123* 136* 106*  BUN 18 13 13   CREATININE 0.68 0.61 0.65  CALCIUM 9.6 9.1 8.9   Liver Function Tests:  Recent Labs  11/04/13 0500  AST 15  ALT 11  ALKPHOS 48  BILITOT 0.5  PROT 5.9*  ALBUMIN 3.2*   CBC:  Recent Labs  11/02/13 2344 11/03/13 1402 11/04/13 0500  WBC 8.1 10.6* 7.4  NEUTROABS 6.2  --   --   HGB 12.3 11.8* 10.8*  HCT 36.8 33.9* 31.4*  MCV 95.1 93.9 93.7  PLT 231 243 204   CBG:  Recent Labs  11/04/13 0753 11/05/13 0721  GLUCAP  108* 111*    LUMBAR SPINE - COMPLETE 4+ VIEW   COMPARISON:  Lumbar spine radiographs Aug 20, 2012   FINDINGS: Moderate to severe L4 compression fracture, with progressed height loss. Remaining lumbar vertebra appear intact. Severe upper lumbar broad levoscoliosis. Maintenance of lumbar lordosis. Severe L2-3 through L5-S1 disc degeneration stent with multilevel severe facet arthropathy. Bone mineral density is decreased without destructive bony lesions. No  definite pars interarticularis defects though scoliosis limits evaluation.   Moderate calcific atherosclerosis of the aorta.   IMPRESSION: Moderate-to-severe chronic L4 compression/insufficiency fracture, mild progressed height loss. No malalignment. Severe broad levoscoliosis, similar.   Osteopenia, which decreases sensitivity for acute nondisplaced fractures.   MRI LUMBAR SPINE WITHOUT CONTRAST   TECHNIQUE: Multiplanar, multisequence MR imaging of the lumbar spine was performed. No intravenous contrast was administered.   COMPARISON:  Multiple exams, including 11/02/2013 and 04/04/2011   FINDINGS: Considerable lumbar scoliosis noted with levoconvex upper an dextroconvex lower components and significant rotary component. Distended gallbladder.   T2 hyperintense intervertebral disc at L4-5. Inferior endplate compression fracture at L4. There is an old superior endplate compression fracture at L4 and an old inferior endplate compression fracture at L3.   The conus medullaris appears normal.  Conus level:  L1.   Additional findings at individual levels are as follows:   L1-2: Borderline right subarticular lateral recess stenosis due to disc bulge.   L2-3: Mild to moderate right subarticular lateral recess stenosis and borderline right foraminal stenosis due to intervertebral spurring, disc bulge, and facet arthropathy.   L3-4: Moderate to prominent central stenosis with moderate to prominent bilateral foraminal stenosis and moderate bilateral subarticular lateral recess stenosis due to posterior osseous ridging, facet arthropathy, ligamentum flavum redundancy, and likely some degree of chronic posterior bony retropulsion from L3.   L4-5: Prominent central narrowing of the thecal sac with prominent left and mild right foraminal stenosis as well as prominent left and moderate right subarticular lateral recess stenosis due to facet arthropathy, intervertebral spurring,  disc bulge, left foraminal disc protrusion, and potentially minimal bony retropulsion.   L5-S1: Moderate left foraminal stenosis and mild left subarticular lateral recess stenosis due to left greater than right facet arthropathy, disc bulge, and left foraminal disc protrusion.   IMPRESSION: 1. New inferior endplate compression fracture at L4. The T2 hyperintensity in the L4-5 intervertebral disc is probably related to the subacute fracture and less likely to be due to discitis. 2. Lumbar spondylosis and degenerative disc disease, causing prominent impingement at L4-5; moderate to prominent impingement at L3-4 ; moderate impingement at L5-S1; and mild to moderate impingement at L2-3, as detailed above. 3. Considerable lumbar scoliosis with rotary component.    ASSESSMENT/PLAN:  L4 compression fracture-continue pain medications and rehabilitation. Hypertension-well-controlled Constipation-new problem. Start MiraLax 17 g daily. Depression-continue antidepressants Anemia-check hemoglobin   I have reviewed patient's medical records received at admission/from hospitalization.  CPT CODE: [id number]  Angela CoxGayani Y Letitia Sabala, MD [facility] [phone]

## 2013-11-18 ENCOUNTER — Other Ambulatory Visit: Payer: Self-pay | Admitting: *Deleted

## 2013-11-18 ENCOUNTER — Other Ambulatory Visit (HOSPITAL_COMMUNITY): Payer: Self-pay | Admitting: Internal Medicine

## 2013-11-18 DIAGNOSIS — M549 Dorsalgia, unspecified: Secondary | ICD-10-CM

## 2013-11-18 DIAGNOSIS — IMO0002 Reserved for concepts with insufficient information to code with codable children: Secondary | ICD-10-CM

## 2013-11-18 MED ORDER — MORPHINE SULFATE 15 MG PO TABS: ORAL_TABLET | ORAL | Status: DC

## 2013-11-18 NOTE — Telephone Encounter (Signed)
[facility] 

## 2013-11-19 ENCOUNTER — Ambulatory Visit (HOSPITAL_COMMUNITY)
Admission: RE | Admit: 2013-11-19 | Discharge: 2013-11-19 | Disposition: A | Discharge status: Home / Self Care | Payer: Medicare Other | Source: Ambulatory Visit | Attending: Internal Medicine | Admitting: Internal Medicine

## 2013-11-19 DIAGNOSIS — M549 Dorsalgia, unspecified: Secondary | ICD-10-CM

## 2013-11-19 DIAGNOSIS — IMO0002 Reserved for concepts with insufficient information to code with codable children: Secondary | ICD-10-CM

## 2013-11-23 ENCOUNTER — Other Ambulatory Visit: Payer: Self-pay | Admitting: *Deleted

## 2013-11-23 MED ORDER — HYDROCODONE-ACETAMINOPHEN 5-325 MG PO TABS: ORAL_TABLET | ORAL | Status: DC

## 2013-11-23 NOTE — Telephone Encounter (Signed)
[facility] 

## 2013-11-24 ENCOUNTER — Other Ambulatory Visit (HOSPITAL_COMMUNITY): Payer: Self-pay | Admitting: Interventional Radiology

## 2013-11-24 ENCOUNTER — Encounter (HOSPITAL_COMMUNITY): Payer: Self-pay

## 2013-11-24 DIAGNOSIS — M549 Dorsalgia, unspecified: Secondary | ICD-10-CM

## 2013-11-24 DIAGNOSIS — T148XXA Other injury of unspecified body region, initial encounter: Secondary | ICD-10-CM

## 2013-11-25 ENCOUNTER — Other Ambulatory Visit: Payer: Self-pay | Admitting: Radiology

## 2013-11-27 ENCOUNTER — Encounter (HOSPITAL_COMMUNITY): Payer: Self-pay

## 2013-11-27 ENCOUNTER — Ambulatory Visit (HOSPITAL_COMMUNITY)
Admission: RE | Admit: 2013-11-27 | Discharge: 2013-11-27 | Disposition: A | Discharge status: Home / Self Care | Payer: Medicare Other | Source: Ambulatory Visit | Attending: Interventional Radiology | Admitting: Interventional Radiology

## 2013-11-27 DIAGNOSIS — M549 Dorsalgia, unspecified: Secondary | ICD-10-CM

## 2013-11-27 DIAGNOSIS — I10 Essential (primary) hypertension: Secondary | ICD-10-CM | POA: Insufficient documentation

## 2013-11-27 DIAGNOSIS — M8448XA Pathological fracture, other site, initial encounter for fracture: Secondary | ICD-10-CM | POA: Diagnosis present

## 2013-11-27 DIAGNOSIS — E785 Hyperlipidemia, unspecified: Secondary | ICD-10-CM | POA: Diagnosis not present

## 2013-11-27 DIAGNOSIS — M810 Age-related osteoporosis without current pathological fracture: Secondary | ICD-10-CM | POA: Insufficient documentation

## 2013-11-27 DIAGNOSIS — T148XXA Other injury of unspecified body region, initial encounter: Secondary | ICD-10-CM

## 2013-11-27 LAB — PROTIME-INR
INR: 0.94 (ref 0.00–1.49)
PROTHROMBIN TIME: 12.6 s (ref 11.6–15.2)

## 2013-11-27 LAB — BASIC METABOLIC PANEL
ANION GAP: 16 — AB (ref 5–15)
BUN: 13 mg/dL (ref 6–23)
CO2: 24 mEq/L (ref 19–32)
Calcium: 9.4 mg/dL (ref 8.4–10.5)
Chloride: 96 mEq/L (ref 96–112)
Creatinine, Ser: 0.59 mg/dL (ref 0.50–1.10)
GFR calc Af Amer: >90 mL/min (ref >90)
GFR, EST NON AFRICAN AMERICAN: 78 mL/min — AB (ref >90)
Glucose, Bld: 118 mg/dL — ABNORMAL HIGH (ref 70–99)
POTASSIUM: 3.9 meq/L (ref 3.7–5.3)
SODIUM: 136 meq/L — AB (ref 137–147)

## 2013-11-27 LAB — CBC
HCT: 40.9 % (ref 36.0–46.0)
Hemoglobin: 14.1 g/dL (ref 12.0–15.0)
MCH: 32.9 pg (ref 26.0–34.0)
MCHC: 34.5 g/dL (ref 30.0–36.0)
MCV: 95.6 fL (ref 78.0–100.0)
PLATELETS: 279 10*3/uL (ref 150–400)
RBC: 4.28 MIL/uL (ref 3.87–5.11)
RDW: 13.2 % (ref 11.5–15.5)
WBC: 6.1 10*3/uL (ref 4.0–10.5)

## 2013-11-27 LAB — APTT: APTT: 28 s (ref 24–37)

## 2013-11-27 MED ORDER — FENTANYL CITRATE 0.05 MG/ML IJ SOLN
INTRAMUSCULAR | Status: AC | PRN
  Administered 2013-11-27 (×2): 25 ug via INTRAVENOUS
  Administered 2013-11-27 (×2): 12.5 ug via INTRAVENOUS

## 2013-11-27 MED ORDER — CEFAZOLIN SODIUM-DEXTROSE 2-3 GM-% IV SOLR
2.0000 g | Freq: Once | INTRAVENOUS | Status: AC
  Administered 2013-11-27: 2 g via INTRAVENOUS

## 2013-11-27 MED ORDER — DIPHENHYDRAMINE HCL 25 MG PO CAPS
ORAL_CAPSULE | ORAL | Status: AC
  Administered 2013-11-27: 25 mg
  Filled 2013-11-27: qty 1

## 2013-11-27 MED ORDER — SODIUM CHLORIDE 0.9 % IV SOLN
INTRAVENOUS | Status: AC | PRN
  Administered 2013-11-27: 75 mL/h via INTRAVENOUS

## 2013-11-27 MED ORDER — FENTANYL CITRATE 0.05 MG/ML IJ SOLN
INTRAMUSCULAR | Status: AC
  Filled 2013-11-27: qty 6

## 2013-11-27 MED ORDER — BUPIVACAINE HCL (PF) 0.25 % IJ SOLN
INTRAMUSCULAR | Status: AC
  Filled 2013-11-27: qty 30

## 2013-11-27 MED ORDER — SODIUM CHLORIDE 0.9 % IV SOLN: Freq: Once | INTRAVENOUS | Status: DC

## 2013-11-27 MED ORDER — HYDROMORPHONE HCL PF 1 MG/ML IJ SOLN
INTRAMUSCULAR | Status: AC
  Filled 2013-11-27: qty 1

## 2013-11-27 MED ORDER — CEFAZOLIN SODIUM-DEXTROSE 2-3 GM-% IV SOLR
INTRAVENOUS | Status: AC
  Filled 2013-11-27: qty 50

## 2013-11-27 MED ORDER — DEXAMETHASONE SODIUM PHOSPHATE 10 MG/ML IJ SOLN
INTRAMUSCULAR | Status: AC
  Administered 2013-11-27: 8 mg
  Filled 2013-11-27: qty 1

## 2013-11-27 MED ORDER — MIDAZOLAM HCL 2 MG/2ML IJ SOLN
INTRAMUSCULAR | Status: AC
  Filled 2013-11-27: qty 6

## 2013-11-27 MED ORDER — FENTANYL CITRATE 0.05 MG/ML IJ SOLN
INTRAMUSCULAR | Status: AC
  Administered 2013-11-27: 25 ug via INTRAVENOUS
  Filled 2013-11-27: qty 2

## 2013-11-27 MED ORDER — MIDAZOLAM HCL 2 MG/2ML IJ SOLN
INTRAMUSCULAR | Status: AC | PRN
  Administered 2013-11-27: 1 mg via INTRAVENOUS
  Administered 2013-11-27: 0.5 mg via INTRAVENOUS

## 2013-11-27 MED ORDER — GELATIN ABSORBABLE 12-7 MM EX MISC
CUTANEOUS | Status: AC
Start: 2013-11-27 — End: 2013-11-27
  Filled 2013-11-27: qty 1

## 2013-11-27 MED ORDER — SODIUM CHLORIDE 0.9 % IV SOLN: INTRAVENOUS | Status: AC

## 2013-11-27 MED ORDER — IOHEXOL 300 MG/ML  SOLN
50.0000 mL | Freq: Once | INTRAMUSCULAR | Status: AC | PRN
  Administered 2013-11-27: 5 mL via INTRAVENOUS

## 2013-11-27 MED ORDER — TOBRAMYCIN SULFATE 1.2 G IJ SOLR
INTRAMUSCULAR | Status: AC
  Filled 2013-11-27: qty 1.2

## 2013-11-27 NOTE — H&P (Signed)
Nancy Meyer is an 80 y.o. female.   Chief Complaint: Pt has had back pain for yrs New worsening of pain x 1 mo Denies injury/fall Lives in [place] in [place] MRI 11/03/13 reveals acute fracture of Lumbar 4 Has consulted with Dr Estanislado Pandy regarding Kyphoplasty/verterbroplasty Now scheduled for L4 KP/VP  HPI: HTN osteoporosis; Mac degeneration; HLD  Past Medical History  Diagnosis Date  . Hypertension   . Osteoporosis   . Macular degeneration   . Arthritis   . Hyperlipidemia     Past Surgical History  Procedure Laterality Date  . Appendectomy    . Eye surgery      Family History  Problem Relation Age of Onset  . Cancer Daughter     breast   Social History:  reports that she has never smoked. She has never used smokeless tobacco. She reports that she does not drink alcohol or use illicit drugs.  Allergies: No Known Allergies   (Not in a hospital admission)  Results for orders placed during the hospital encounter of 11/27/13 (from the past 48 hour(s))  APTT     Status: None   Collection Time    11/27/13  7:58 AM      Result Value Ref Range   aPTT 28  24 - 37 seconds  BASIC METABOLIC PANEL     Status: Abnormal   Collection Time    11/27/13  7:58 AM      Result Value Ref Range   Sodium 136 (*) 137 - 147 mEq/L   Potassium 3.9  3.7 - 5.3 mEq/L   Chloride 96  96 - 112 mEq/L   CO2 24  19 - 32 mEq/L   Glucose, Bld 118 (*) 70 - 99 mg/dL   BUN 13  6 - 23 mg/dL   Creatinine, Ser 0.59  0.50 - 1.10 mg/dL   Calcium 9.4  8.4 - 10.5 mg/dL   GFR calc non Af Amer 78 (*) >90 mL/min   GFR calc Af Amer >90  >90 mL/min   Comment: (NOTE)     The eGFR has been calculated using the CKD EPI equation.     This calculation has not been validated in all clinical situations.     eGFR's persistently <90 mL/min signify possible Chronic Kidney     Disease.   Anion gap 16 (*) 5 - 15  CBC     Status: None   Collection Time    11/27/13  7:58 AM      Result Value Ref Range   WBC  6.1  4.0 - 10.5 K/uL   RBC 4.28  3.87 - 5.11 MIL/uL   Hemoglobin 14.1  12.0 - 15.0 g/dL   HCT 40.9  36.0 - 46.0 %   MCV 95.6  78.0 - 100.0 fL   MCH 32.9  26.0 - 34.0 pg   MCHC 34.5  30.0 - 36.0 g/dL   RDW 13.2  11.5 - 15.5 %   Platelets 279  150 - 400 K/uL  PROTIME-INR     Status: None   Collection Time    11/27/13  7:58 AM      Result Value Ref Range   Prothrombin Time 12.6  11.6 - 15.2 seconds   INR 0.94  0.00 - 1.49   No results found.  Review of Systems  Constitutional: Positive for weight loss. Negative for fever.  HENT: Negative for hearing loss.   Respiratory: Negative for shortness of breath.   Cardiovascular: Negative for  chest pain.  Gastrointestinal: Negative for vomiting and abdominal pain.  Musculoskeletal: Positive for back pain.  Neurological: Positive for weakness. Negative for dizziness.  Psychiatric/Behavioral:       Some mild confusion    Blood pressure 121/85, pulse 101, temperature 98 F (36.7 C), temperature source Oral, resp. rate 18, height 5' 2.5" (1.588 m), weight 40.824 kg (90 lb), SpO2 98.00%. Physical Exam  Constitutional:  thin  Cardiovascular: Normal rate and regular rhythm.   No murmur heard. Respiratory: Effort normal and breath sounds normal. She has no wheezes.  GI: Soft. Bowel sounds are normal. There is no tenderness.  Musculoskeletal: Normal range of motion.  Low back pain  Neurological: She is alert.  Skin: Skin is warm and dry.  Psychiatric: She has a normal mood and affect. Her behavior is normal. Judgment and thought content normal.  Mild confusion     Assessment/Plan Worsening back pain L4 fx per MRI Amenable to KP/VP Now scheduled for same Pt and family aware of procedure benefits and risks and agreeable to proceed Consent signed and in chart  [place] A 11/27/2013, 8:50 AM

## 2013-11-27 NOTE — Sedation Documentation (Addendum)
O2 d/c'd, back on stretcher

## 2013-11-27 NOTE — Sedation Documentation (Signed)
O2 2l/[place] started 

## 2013-11-27 NOTE — Sedation Documentation (Signed)
Pt complaining of back pain radiating down left leg as pre procedure. Writhing in bed ice pack applied called to MD Deveshwar for assistance.  Pt given 25 mcg fentanyl IV with little relief.  Called back to MD Deveshwar for assistance pt rating pain 10/10 on pain scale in left hip and back area. Dressing CDI. Ice pack in place. New orders for 8 mg decadron IV and 25 mg benadryl po for inflammatory pain. Pt given meds per order. Pt states at 1100 that she feels somewhat better but, remains with pain from back to left hip and down leg. Pt States at the nursing home she takes Morphine IR tabs that do relieve her pain.  Transported to Short Stay area pt feeling some relief. 6/10.

## 2013-11-27 NOTE — Procedures (Signed)
S/P L4 VP 

## 2013-11-27 NOTE — Discharge Instructions (Signed)
1.No stooping,bending or lifting more than 10 lbs for 2 weeks. °2.Use walker to ambulate for 2 weeks. °3.RTC in 2 weeks ° °KYPHOPLASTY/VERTEBROPLASTY DISCHARGE INSTRUCTIONS ° °Medications: (check all that apply) ° °   Resume all home medications as before procedure. °   °   Resume your (aspirin/Plavix/Coumadin) on                °  °Continue your pain medications as prescribed as needed.  Over the next 3-5 days, decrease your pain medication as tolerated.  Over the counter medications (i.e. Tylenol, ibuprofen, and aleve) may be substituted once severe/moderate pain symptoms have subsided. ° ° Wound Care: °- Bandages may be removed the day following your procedure.  You may get your incision wet once bandages are removed.  Bandaids may be used to cover the incisions until scab formation.  Topical ointments are optional. ° °- If you develop a fever greater than 101 degrees, have increased skin redness at the incision sites or pus-like oozing from incisions occurring within 1 week of the procedure, contact radiology at [phone] or [phone]. ° °- Ice pack to back for 15-20 minutes 2-3 time per day for first 2-3 days post procedure.  The ice will expedite muscle healing and help with the pain from the incisions. ° ° Activity: °- Bedrest today with limited activity for 24 hours post procedure. ° °- No driving for 48 hours. ° °- Increase your activity as tolerated after bedrest (with assistance if necessary). ° °- Refrain from any strenuous activity or heavy lifting (greater than 10 lbs.). ° ° Follow up: °- Contact radiology at [phone] or [phone] if any questions/concerns. ° °- A physician assistant from radiology will contact you in approximately 1 week. ° °- If a biopsy was performed at the time of your procedure, your referring physician should receive the results in usually 2-3 days. ° ° ° ° ° ° ° ° °

## 2013-11-27 NOTE — Progress Notes (Signed)
Report given to PTAR/35.  Report called to Marisue Ivan, nursing supervisor

## 2013-12-04 ENCOUNTER — Other Ambulatory Visit (HOSPITAL_COMMUNITY): Payer: Self-pay | Admitting: Interventional Radiology

## 2013-12-04 ENCOUNTER — Encounter: Payer: Self-pay | Admitting: Adult Health

## 2013-12-04 ENCOUNTER — Non-Acute Institutional Stay (SKILLED_NURSING_FACILITY): Payer: Medicare Other | Admitting: Adult Health

## 2013-12-04 DIAGNOSIS — S32040D Wedge compression fracture of fourth lumbar vertebra, subsequent encounter for fracture with routine healing: Secondary | ICD-10-CM

## 2013-12-04 DIAGNOSIS — K5900 Constipation, unspecified: Secondary | ICD-10-CM

## 2013-12-04 DIAGNOSIS — F329 Major depressive disorder, single episode, unspecified: Secondary | ICD-10-CM

## 2013-12-04 DIAGNOSIS — F411 Generalized anxiety disorder: Secondary | ICD-10-CM

## 2013-12-04 DIAGNOSIS — F419 Anxiety disorder, unspecified: Secondary | ICD-10-CM

## 2013-12-04 DIAGNOSIS — F3289 Other specified depressive episodes: Secondary | ICD-10-CM

## 2013-12-04 DIAGNOSIS — IMO0002 Reserved for concepts with insufficient information to code with codable children: Secondary | ICD-10-CM

## 2013-12-04 DIAGNOSIS — M810 Age-related osteoporosis without current pathological fracture: Secondary | ICD-10-CM

## 2013-12-04 DIAGNOSIS — I10 Essential (primary) hypertension: Secondary | ICD-10-CM

## 2013-12-04 DIAGNOSIS — F32A Depression, unspecified: Secondary | ICD-10-CM

## 2013-12-04 DIAGNOSIS — R110 Nausea: Secondary | ICD-10-CM

## 2013-12-04 NOTE — Progress Notes (Signed)
Patient ID: Nancy Meyer, female   DOB: [birth year], 80 y.o.   MRN: [id number]             PROGRESS NOTE  DATE:   12/04/13  FACILITY: Nursing Home Location: [facility]  LEVEL OF CARE: SNF (31)  Acute Visit  CHIEF COMPLAINT:  Discharge Notes  HISTORY OF PRESENT ILLNESS: This is a 80 year old female who is for discharge home with Home health PT, OT, Nursing and CNA. DME: semi-electric hospital bed, bedside commode and wheelchair. She  has been admitted to [facility] on 11/05/13 from [facility] with Compression fracture of L4 lumbar vertebrae. She recently (11/27/13) had kyphoplasty/vertebroplasty. Patient was admitted to this facility for short-term rehabilitation after the patient's recent hospitalization.  Patient has completed SNF rehabilitation and therapy has cleared the patient for discharge.  REASSESSMENT OF ONGOING PROBLEM(S):  HTN: Pt 's HTN remains stable.  Denies CP, sob, DOE, pedal edema, headaches, dizziness or visual disturbances.  No complications from the medications currently being used.  Last BP : 138/71  CONSTIPATION: The constipation remains stable. No complications from the medications presently being used. Patient denies ongoing constipation, abdominal pain, nausea or vomiting.  DEPRESSION: The depression remains stable. Patient denies ongoing feelings of sadness, insomnia, anedhonia or lack of appetite. No complications reported from the medications currently being used. Staff do not report behavioral problems.  PAST MEDICAL HISTORY : Reviewed.  No changes/see problem list  CURRENT MEDICATIONS: Reviewed per MAR/see medication list  REVIEW OF SYSTEMS:  GENERAL: no change in appetite, no fatigue, no weight changes, no fever, chills or weakness RESPIRATORY: no cough, SOB, DOE, wheezing, hemoptysis CARDIAC: no chest pain, edema or palpitations GI: no abdominal pain, diarrhea, heart burn, nausea or vomiting  PHYSICAL  EXAMINATION  GENERAL: no acute distress, normal body habitus NECK: supple, trachea midline, no neck masses, no thyroid tenderness, no thyromegaly LYMPHATICS: no LAN in the neck, no supraclavicular LAN RESPIRATORY: breathing is even & unlabored, BS CTAB CARDIAC: RRR, no murmur,no extra heart sounds, no edema GI: abdomen soft, normal BS, no masses, no tenderness, no hepatomegaly, no splenomegaly EXTREMITIES:  Able to move all 4 extremities PSYCHIATRIC: the patient is alert & oriented to person, affect & behavior appropriate  LABS/RADIOLOGY: Labs reviewed: Basic Metabolic Panel:  Recent Labs  40/98/11 1402 11/04/13 0500 11/27/13 0758  NA 132* 134* 136*  K 3.9 4.0 3.9  CL 95* 99 96  CO2 GLUCOSE 136* 106* 118*  BUN CREATININE 0.61 0.65 0.59  CALCIUM 9.1 8.9 9.4   Liver Function Tests:  Recent Labs  11/04/13 0500  AST 15  ALT 11  ALKPHOS 48  BILITOT 0.5  PROT 5.9*  ALBUMIN 3.2*   CBC:  Recent Labs  11/02/13 2344 11/03/13 1402 11/04/13 0500 11/27/13 0758  WBC 8.1 10.6* 7.4 6.1  NEUTROABS 6.2  --   --   --   HGB 12.3 11.8* 10.8* 14.1  HCT 36.8 33.9* 31.4* 40.9  MCV 95.1 93.9 93.7 95.6  PLT 231 243 204 279   CBG:  Recent Labs  11/04/13 0753 11/05/13 0721  GLUCAP 108* 111*    EXAM: LUMBAR SPINE - COMPLETE 4+ VIEW   COMPARISON:  Lumbar spine radiographs Aug 20, 2012   FINDINGS: Moderate to severe L4 compression fracture, with progressed height loss. Remaining lumbar vertebra appear intact. Severe upper lumbar broad levoscoliosis. Maintenance of lumbar lordosis. Severe L2-3 through L5-S1 disc degeneration stent with multilevel  severe facet arthropathy. Bone mineral density is decreased without destructive bony lesions. No definite pars interarticularis defects though scoliosis limits evaluation.   Moderate calcific atherosclerosis of the aorta.   IMPRESSION: Moderate-to-severe chronic L4 compression/insufficiency  fracture, mild progressed height loss. No malalignment. Severe broad levoscoliosis, similar.   Osteopenia, which decreases sensitivity for acute nondisplaced fractures. EXAM: MRI LUMBAR SPINE WITHOUT CONTRAST   TECHNIQUE: Multiplanar, multisequence MR imaging of the lumbar spine was performed. No intravenous contrast was administered.   COMPARISON:  Multiple exams, including 11/02/2013 and 04/04/2011   FINDINGS: Considerable lumbar scoliosis noted with levoconvex upper an dextroconvex lower components and significant rotary component. Distended gallbladder.   T2 hyperintense intervertebral disc at L4-5. Inferior endplate compression fracture at L4. There is an old superior endplate compression fracture at L4 and an old inferior endplate compression fracture at L3.   The conus medullaris appears normal.  Conus level:  L1.   Additional findings at individual levels are as follows:   L1-2: Borderline right subarticular lateral recess stenosis due to disc bulge.   L2-3: Mild to moderate right subarticular lateral recess stenosis and borderline right foraminal stenosis due to intervertebral spurring, disc bulge, and facet arthropathy.   L3-4: Moderate to prominent central stenosis with moderate to prominent bilateral foraminal stenosis and moderate bilateral subarticular lateral recess stenosis due to posterior osseous ridging, facet arthropathy, ligamentum flavum redundancy, and likely some degree of chronic posterior bony retropulsion from L3.   L4-5: Prominent central narrowing of the thecal sac with prominent left and mild right foraminal stenosis as well as prominent left and moderate right subarticular lateral recess stenosis due to facet arthropathy, intervertebral spurring, disc bulge, left foraminal disc protrusion, and potentially minimal bony retropulsion.   L5-S1: Moderate left foraminal stenosis and mild left subarticular lateral recess stenosis due to left  greater than right facet arthropathy, disc bulge, and left foraminal disc protrusion.   IMPRESSION: 1. New inferior endplate compression fracture at L4. The T2 hyperintensity in the L4-5 intervertebral disc is probably related to the subacute fracture and less likely to be due to discitis. 2. Lumbar spondylosis and degenerative disc disease, causing prominent impingement at L4-5; moderate to prominent impingement at L3-4 ; moderate impingement at L5-S1; and mild to moderate impingement at L2-3, as detailed above. 3. Considerable lumbar scoliosis with rotary component.   ASSESSMENT/PLAN:  Compression fracture of L4 lumbar vertebra S/P kyphoplasty/vertebroplasty  -  continue Lidoderm patch, MSIR  7.5 mg PO Q 6 hours PRN (for severe pain) and Norco 5/325 mg 1 tab PO Q 4 hours PRN (moderate pain); discontinue Ultracet Osteoporosis - continue calcium supplementation Depression - stable; continue Wellbutrin Hypertension - continue Cozaar 100 mg PO  Q D Constipation - continue Colace, Miralax and Senna-S Anxiety - stable; continue Xanax 0.25 mg by mouth twice a day when necessary Nausea - discontinue Phenergan and start Zofran 4 mg 1 tab PO Q 8 hours PRN   I have filled out patient's discharge paperwork and written prescriptions.  Patient will receive home health PT, OT, Nursing and CNA.  DME provided: Semi-electric hospital bed, bedside commode and wheelchair  Total discharge time: Greater than 30 minutes  Discharge time involved coordination of the discharge process with social worker, nursing staff and therapy department. Medical justification for home health services/DME verified.  CPT CODE: 09811  Ella Bodo - NP [facility] [phone]

## 2013-12-05 DIAGNOSIS — I10 Essential (primary) hypertension: Secondary | ICD-10-CM

## 2013-12-05 DIAGNOSIS — M8448XD Pathological fracture, other site, subsequent encounter for fracture with routine healing: Secondary | ICD-10-CM

## 2013-12-05 DIAGNOSIS — M51379 Other intervertebral disc degeneration, lumbosacral region without mention of lumbar back pain or lower extremity pain: Secondary | ICD-10-CM

## 2013-12-05 DIAGNOSIS — M810 Age-related osteoporosis without current pathological fracture: Secondary | ICD-10-CM

## 2013-12-05 DIAGNOSIS — M5137 Other intervertebral disc degeneration, lumbosacral region: Secondary | ICD-10-CM

## 2013-12-14 ENCOUNTER — Encounter (HOSPITAL_COMMUNITY): Payer: Self-pay | Admitting: Emergency Medicine

## 2013-12-14 ENCOUNTER — Inpatient Hospital Stay (HOSPITAL_COMMUNITY)
Admission: EM | Admit: 2013-12-14 | Discharge: 2013-12-19 | DRG: 982 | Disposition: A | Discharge status: Skilled Nursing Facility | Payer: Medicare Other | Attending: Internal Medicine | Admitting: Internal Medicine

## 2013-12-14 ENCOUNTER — Emergency Department (HOSPITAL_COMMUNITY): Payer: Medicare Other

## 2013-12-14 DIAGNOSIS — Z681 Body mass index (BMI) 19 or less, adult: Secondary | ICD-10-CM | POA: Diagnosis not present

## 2013-12-14 DIAGNOSIS — F329 Major depressive disorder, single episode, unspecified: Secondary | ICD-10-CM | POA: Diagnosis present

## 2013-12-14 DIAGNOSIS — K5900 Constipation, unspecified: Secondary | ICD-10-CM | POA: Diagnosis present

## 2013-12-14 DIAGNOSIS — F3289 Other specified depressive episodes: Secondary | ICD-10-CM | POA: Diagnosis present

## 2013-12-14 DIAGNOSIS — D649 Anemia, unspecified: Secondary | ICD-10-CM

## 2013-12-14 DIAGNOSIS — Z79899 Other long term (current) drug therapy: Secondary | ICD-10-CM

## 2013-12-14 DIAGNOSIS — S32009A Unspecified fracture of unspecified lumbar vertebra, initial encounter for closed fracture: Secondary | ICD-10-CM | POA: Diagnosis present

## 2013-12-14 DIAGNOSIS — E876 Hypokalemia: Secondary | ICD-10-CM | POA: Diagnosis present

## 2013-12-14 DIAGNOSIS — E440 Moderate protein-calorie malnutrition: Secondary | ICD-10-CM | POA: Diagnosis present

## 2013-12-14 DIAGNOSIS — S32040D Wedge compression fracture of fourth lumbar vertebra, subsequent encounter for fracture with routine healing: Secondary | ICD-10-CM

## 2013-12-14 DIAGNOSIS — G8929 Other chronic pain: Secondary | ICD-10-CM | POA: Diagnosis present

## 2013-12-14 DIAGNOSIS — Z803 Family history of malignant neoplasm of breast: Secondary | ICD-10-CM | POA: Diagnosis not present

## 2013-12-14 DIAGNOSIS — M129 Arthropathy, unspecified: Secondary | ICD-10-CM | POA: Diagnosis present

## 2013-12-14 DIAGNOSIS — Z881 Allergy status to other antibiotic agents status: Secondary | ICD-10-CM | POA: Diagnosis not present

## 2013-12-14 DIAGNOSIS — I10 Essential (primary) hypertension: Secondary | ICD-10-CM | POA: Diagnosis present

## 2013-12-14 DIAGNOSIS — R52 Pain, unspecified: Secondary | ICD-10-CM | POA: Diagnosis present

## 2013-12-14 DIAGNOSIS — Z7401 Bed confinement status: Secondary | ICD-10-CM

## 2013-12-14 DIAGNOSIS — F32A Depression, unspecified: Secondary | ICD-10-CM

## 2013-12-14 DIAGNOSIS — M810 Age-related osteoporosis without current pathological fracture: Secondary | ICD-10-CM

## 2013-12-14 DIAGNOSIS — E871 Hypo-osmolality and hyponatremia: Secondary | ICD-10-CM

## 2013-12-14 DIAGNOSIS — S32010A Wedge compression fracture of first lumbar vertebra, initial encounter for closed fracture: Secondary | ICD-10-CM | POA: Diagnosis present

## 2013-12-14 DIAGNOSIS — X58XXXA Exposure to other specified factors, initial encounter: Secondary | ICD-10-CM | POA: Diagnosis present

## 2013-12-14 DIAGNOSIS — IMO0002 Reserved for concepts with insufficient information to code with codable children: Secondary | ICD-10-CM

## 2013-12-14 DIAGNOSIS — S32010S Wedge compression fracture of first lumbar vertebra, sequela: Secondary | ICD-10-CM

## 2013-12-14 DIAGNOSIS — E785 Hyperlipidemia, unspecified: Secondary | ICD-10-CM | POA: Diagnosis present

## 2013-12-14 DIAGNOSIS — R109 Unspecified abdominal pain: Secondary | ICD-10-CM

## 2013-12-14 DIAGNOSIS — H3530 Unspecified macular degeneration: Secondary | ICD-10-CM | POA: Diagnosis present

## 2013-12-14 DIAGNOSIS — R739 Hyperglycemia, unspecified: Secondary | ICD-10-CM

## 2013-12-14 DIAGNOSIS — E43 Unspecified severe protein-calorie malnutrition: Secondary | ICD-10-CM

## 2013-12-14 DIAGNOSIS — J302 Other seasonal allergic rhinitis: Secondary | ICD-10-CM

## 2013-12-14 DIAGNOSIS — S32040S Wedge compression fracture of fourth lumbar vertebra, sequela: Secondary | ICD-10-CM

## 2013-12-14 DIAGNOSIS — K5641 Fecal impaction: Secondary | ICD-10-CM | POA: Diagnosis present

## 2013-12-14 DIAGNOSIS — R110 Nausea: Secondary | ICD-10-CM

## 2013-12-14 LAB — CBC WITH DIFFERENTIAL/PLATELET
BASOS ABS: 0 10*3/uL (ref 0.0–0.1)
Basophils Relative: 0 % (ref 0–1)
Eosinophils Absolute: 0.1 10*3/uL (ref 0.0–0.7)
Eosinophils Relative: 2 % (ref 0–5)
HCT: 38.9 % (ref 36.0–46.0)
HEMOGLOBIN: 13.3 g/dL (ref 12.0–15.0)
LYMPHS ABS: 1.3 10*3/uL (ref 0.7–4.0)
Lymphocytes Relative: 17 % (ref 12–46)
MCH: 32.8 pg (ref 26.0–34.0)
MCHC: 34.2 g/dL (ref 30.0–36.0)
MCV: 95.8 fL (ref 78.0–100.0)
Monocytes Absolute: 0.8 10*3/uL (ref 0.1–1.0)
Monocytes Relative: 11 % (ref 3–12)
NEUTROS PCT: 70 % (ref 43–77)
Neutro Abs: 5.5 10*3/uL (ref 1.7–7.7)
PLATELETS: 317 10*3/uL (ref 150–400)
RBC: 4.06 MIL/uL (ref 3.87–5.11)
RDW: 13 % (ref 11.5–15.5)
WBC: 7.8 10*3/uL (ref 4.0–10.5)

## 2013-12-14 LAB — COMPREHENSIVE METABOLIC PANEL
ALT: 15 U/L (ref 0–35)
ANION GAP: 14 (ref 5–15)
AST: 21 U/L (ref 0–37)
Albumin: 3.4 g/dL — ABNORMAL LOW (ref 3.5–5.2)
Alkaline Phosphatase: 98 U/L (ref 39–117)
BUN: 10 mg/dL (ref 6–23)
CO2: 25 mEq/L (ref 19–32)
CREATININE: 0.52 mg/dL (ref 0.50–1.10)
Calcium: 9.6 mg/dL (ref 8.4–10.5)
Chloride: 95 mEq/L — ABNORMAL LOW (ref 96–112)
GFR calc non Af Amer: 81 mL/min — ABNORMAL LOW (ref >90)
Glucose, Bld: 117 mg/dL — ABNORMAL HIGH (ref 70–99)
Potassium: 3.8 mEq/L (ref 3.7–5.3)
Sodium: 134 mEq/L — ABNORMAL LOW (ref 137–147)
Total Bilirubin: 0.4 mg/dL (ref 0.3–1.2)
Total Protein: 6.9 g/dL (ref 6.0–8.3)

## 2013-12-14 LAB — URINALYSIS, ROUTINE W REFLEX MICROSCOPIC
Bilirubin Urine: NEGATIVE
Glucose, UA: NEGATIVE mg/dL
Hgb urine dipstick: NEGATIVE
KETONES UR: 15 mg/dL — AB
LEUKOCYTES UA: NEGATIVE
NITRITE: NEGATIVE
PH: 7.5 (ref 5.0–8.0)
PROTEIN: NEGATIVE mg/dL
Specific Gravity, Urine: 1.011 (ref 1.005–1.030)
UROBILINOGEN UA: 0.2 mg/dL (ref 0.0–1.0)

## 2013-12-14 LAB — I-STAT CG4 LACTIC ACID, ED: Lactic Acid, Venous: 0.93 mmol/L (ref 0.5–2.2)

## 2013-12-14 LAB — LIPASE, BLOOD: Lipase: 12 U/L (ref 11–59)

## 2013-12-14 LAB — TSH: TSH: 1.46 u[IU]/mL (ref 0.350–4.500)

## 2013-12-14 MED ORDER — INFLUENZA VAC SPLIT QUAD 0.5 ML IM SUSY
0.5000 mL | PREFILLED_SYRINGE | INTRAMUSCULAR | Status: DC
  Filled 2013-12-14 (×2): qty 0.5

## 2013-12-14 MED ORDER — CALCIUM CITRATE-VITAMIN D 200-200 MG-UNIT PO TABS: 1.0000 | ORAL_TABLET | Freq: Every day | ORAL | Status: DC

## 2013-12-14 MED ORDER — ONDANSETRON HCL 4 MG/2ML IJ SOLN: 4.0000 mg | Freq: Three times a day (TID) | INTRAMUSCULAR | Status: DC | PRN

## 2013-12-14 MED ORDER — MORPHINE SULFATE 4 MG/ML IJ SOLN
2.0000 mg | INTRAMUSCULAR | Status: DC | PRN
  Administered 2013-12-15: 2 mg via INTRAVENOUS
  Filled 2013-12-14 (×2): qty 1

## 2013-12-14 MED ORDER — SODIUM CHLORIDE 0.9 % IV SOLN
INTRAVENOUS | Status: AC
  Administered 2013-12-14: 1000 mL via INTRAVENOUS
  Administered 2013-12-15: 08:00:00 via INTRAVENOUS

## 2013-12-14 MED ORDER — CIPROFLOXACIN HCL 500 MG PO TABS
500.0000 mg | ORAL_TABLET | Freq: Two times a day (BID) | ORAL | Status: DC
  Administered 2013-12-14 – 2013-12-15 (×2): 500 mg via ORAL
  Filled 2013-12-14 (×4): qty 1

## 2013-12-14 MED ORDER — DOCUSATE SODIUM 100 MG PO CAPS
100.0000 mg | ORAL_CAPSULE | Freq: Two times a day (BID) | ORAL | Status: DC
  Administered 2013-12-14 – 2013-12-19 (×10): 100 mg via ORAL
  Filled 2013-12-14 (×11): qty 1

## 2013-12-14 MED ORDER — ALPRAZOLAM 0.25 MG PO TABS
0.2500 mg | ORAL_TABLET | Freq: Two times a day (BID) | ORAL | Status: DC | PRN
  Administered 2013-12-16 – 2013-12-18 (×4): 0.25 mg via ORAL
  Filled 2013-12-14 (×4): qty 1

## 2013-12-14 MED ORDER — MINERAL OIL RE ENEM
1.0000 | ENEMA | Freq: Once | RECTAL | Status: AC
  Administered 2013-12-14: 1 via RECTAL
  Filled 2013-12-14: qty 1

## 2013-12-14 MED ORDER — MORPHINE SULFATE 4 MG/ML IJ SOLN
2.0000 mg | Freq: Once | INTRAMUSCULAR | Status: AC
  Administered 2013-12-14: 2 mg via INTRAVENOUS
  Filled 2013-12-14: qty 1

## 2013-12-14 MED ORDER — CETYLPYRIDINIUM CHLORIDE 0.05 % MT LIQD
7.0000 mL | Freq: Two times a day (BID) | OROMUCOSAL | Status: DC
  Administered 2013-12-15 – 2013-12-17 (×4): 7 mL via OROMUCOSAL

## 2013-12-14 MED ORDER — ONDANSETRON HCL 4 MG/2ML IJ SOLN
4.0000 mg | Freq: Three times a day (TID) | INTRAMUSCULAR | Status: DC | PRN
  Administered 2013-12-15: 4 mg via INTRAVENOUS
  Filled 2013-12-14: qty 2

## 2013-12-14 MED ORDER — LOSARTAN POTASSIUM 50 MG PO TABS
100.0000 mg | ORAL_TABLET | Freq: Every day | ORAL | Status: DC
  Administered 2013-12-14 – 2013-12-19 (×6): 100 mg via ORAL
  Filled 2013-12-14 (×6): qty 2

## 2013-12-14 MED ORDER — MORPHINE SULFATE 4 MG/ML IJ SOLN
2.0000 mg | Freq: Once | INTRAMUSCULAR | Status: AC
  Administered 2013-12-14: 2 mg via INTRAVENOUS

## 2013-12-14 MED ORDER — BUPROPION HCL ER (SR) 150 MG PO TB12
150.0000 mg | ORAL_TABLET | Freq: Every day | ORAL | Status: DC
  Administered 2013-12-14 – 2013-12-19 (×6): 150 mg via ORAL
  Filled 2013-12-14 (×6): qty 1

## 2013-12-14 MED ORDER — CALCIUM CARBONATE-VITAMIN D 250-125 MG-UNIT PO TABS: 1.0000 | ORAL_TABLET | Freq: Every day | ORAL | Status: DC

## 2013-12-14 MED ORDER — LACTULOSE 10 GM/15ML PO SOLN
10.0000 g | Freq: Two times a day (BID) | ORAL | Status: DC
  Administered 2013-12-14 – 2013-12-15 (×2): 10 g via ORAL
  Filled 2013-12-14 (×3): qty 15

## 2013-12-14 MED ORDER — VITAMIN D3 25 MCG (1000 UNIT) PO TABS
1000.0000 [IU] | ORAL_TABLET | Freq: Every day | ORAL | Status: DC
  Administered 2013-12-14 – 2013-12-19 (×6): 1000 [IU] via ORAL
  Filled 2013-12-14 (×6): qty 1

## 2013-12-14 MED ORDER — IOHEXOL 300 MG/ML  SOLN
80.0000 mL | Freq: Once | INTRAMUSCULAR | Status: AC | PRN
  Administered 2013-12-14: 80 mL via INTRAVENOUS

## 2013-12-14 MED ORDER — IOHEXOL 300 MG/ML  SOLN
50.0000 mL | Freq: Once | INTRAMUSCULAR | Status: AC | PRN
  Administered 2013-12-14: 50 mL via ORAL

## 2013-12-14 MED ORDER — ONDANSETRON HCL 4 MG/2ML IJ SOLN
4.0000 mg | Freq: Once | INTRAMUSCULAR | Status: AC
  Administered 2013-12-14: 4 mg via INTRAVENOUS
  Filled 2013-12-14: qty 2

## 2013-12-14 NOTE — H&P (Addendum)
[facility] History and Physical  Nancy Meyer WJX:[id number] DOB: 04-Jan-1921 DOA: 12/14/2013  Referring physician: ER physician PCP: Pearson Grippe, MD   Chief Complaint: abdominal pain  HPI:  80 year old female with past medical history of constipation, depression, hypertension who presented to [facility] ED 12/14/2013 with worsening  Lower and mid abdominal pain and constipation for past few days prior to this admission. Pain is 4/-5/10 in intensity and non radiating. No diarrhea. No blood per rectum. No fevers or chills. No chest pain, palpitations or shortness of breath. No cough. No lightheadedness or loss of consciousness. No falls. In ED, vitals are stable. No BM with mineral oil enema. Blood work essentially unremarkable. CT abdomen showed large amount of stool throughout the colon compatible with severe constipation.Pt admitted for constipation and related abdominal pain.  Assessment & Plan    Active Problems: Constipation  Pt given mineral oil enema in ED. No BM yet. Added tap water enema. Added lactulose 10 gm PO BID.  Restarted colace 100 mg PO BID.  Stop calcium as it may contribute to constipation. L1 vertebral compression fracture  No complaints of pain at the time. Will see with IR in am if any intervention needed. Hypertension  Resume losartan 100 mg pO daily  Depression  Resume Wellbutrin 150 mg PO daily  DVT prophylaxis:   SCD's bilaterally   Radiological Exams on Admission: Ct Abdomen Pelvis W Contrast 12/14/2013    1. Large amount of stool throughout the colon compatible with severe constipation. 2. Distended gallbladder without surrounding inflammatory change to suggest acute cholecystitis. 3. Interval progression of L4 vertebral body compression fracture. New compression fracture of the L1 vertebral body. 4. Subpleural ground-glass and consolidative opacities within the right greater than left lower lobes suggestive of atelectasis. Infection not excluded.     Code Status: Full Family Communication: Plan of care discussed with the patient  Disposition Plan: Admit for further evaluation  Manson Passey, MD  [facility] Pager [phone]  Review of Systems:  Constitutional: Negative for fever, chills and malaise/fatigue. Negative for diaphoresis.  HENT: Negative for hearing loss, ear pain, nosebleeds, congestion, sore throat, neck pain, tinnitus and ear discharge.   Eyes: Negative for blurred vision, double vision, photophobia, pain, discharge and redness.  Respiratory: Negative for cough, hemoptysis, sputum production, shortness of breath, wheezing and stridor.   Cardiovascular: Negative for chest pain, palpitations, orthopnea, claudication and leg swelling.  Gastrointestinal: per HPI  Genitourinary: Negative for dysuria, urgency, frequency, hematuria and flank pain.  Musculoskeletal: Negative for myalgias, back pain, joint pain and falls.  Skin: Negative for itching and rash.  Neurological: Negative for dizziness and weakness. Negative for tingling, tremors, sensory change, speech change, focal weakness, loss of consciousness and headaches.  Endo/Heme/Allergies: Negative for environmental allergies and polydipsia. Does not bruise/bleed easily.  Psychiatric/Behavioral: Negative for suicidal ideas. The patient is not nervous/anxious.      Past Medical History  Diagnosis Date  . Hypertension   . Osteoporosis   . Macular degeneration   . Arthritis   . Hyperlipidemia    Past Surgical History  Procedure Laterality Date  . Appendectomy    . Eye surgery    . Back surgery     Social History:  reports that she has never smoked. She has never used smokeless tobacco. She reports that she does not drink alcohol or use illicit drugs.  Allergies  Allergen Reactions  . Macrobid [Nitrofurantoin] Itching    Family History:  Family History  Problem Relation  Age of Onset  . Cancer Daughter     breast     Prior to Admission medications    Medication Sig Start Date End Date Taking? Authorizing Provider  ALPRAZolam (XANAX) 0.25 MG tablet Take 0.25 mg by mouth 2 (two) times daily as needed for anxiety.   Yes Historical Provider, MD  bisacodyl (DULCOLAX) 5 MG EC tablet Take 5 mg by mouth daily as needed for mild constipation.    Yes Historical Provider, MD  buPROPion (WELLBUTRIN SR) 150 MG 12 hr tablet Take 150 mg by mouth daily.    Yes Historical Provider, MD  calcium citrate-vitamin D 200-200 MG-UNIT TABS Take 1 tablet by mouth daily.    Yes Historical Provider, MD  cholecalciferol (VITAMIN D) 1000 UNITS tablet Take 1,000 Units by mouth every morning.    Yes Historical Provider, MD  ciprofloxacin (CIPRO) 500 MG tablet Take 500 mg by mouth 2 (two) times daily.   Yes Historical Provider, MD  docusate sodium 100 MG CAPS Take 100 mg by mouth 2 (two) times daily. 11/05/13  Yes Nishant Dhungel, MD  HYDROcodone-acetaminophen (NORCO/VICODIN) 5-325 MG per tablet Take 1 tablet by mouth every 4 (four) hours as needed for severe pain.    Yes Historical Provider, MD  lidocaine (LIDODERM) 5 % Place 1 patch onto the skin daily. Remove & Discard patch within 12 hours or as directed by MD 11/05/13  Yes Nishant Dhungel, MD  losartan (COZAAR) 100 MG tablet Take 100 mg by mouth daily.  07/12/11  Yes Historical Provider, MD  morphine (MSIR) 15 MG tablet Take 7.5 mg by mouth every 4 (four) hours as needed for severe pain.    Yes Historical Provider, MD  Multiple Vitamins-Minerals (PRESERVISION AREDS 2 PO) Take 2 tablets by mouth daily.   Yes Historical Provider, MD  magnesium hydroxide (PHILLIPS CHEWS) 311 MG CHEW chewable tablet Chew 622 mg by mouth every 4 (four) hours as needed (constipation).    Historical Provider, MD  promethazine (PHENERGAN) 25 MG tablet Take 25 mg by mouth every 8 (eight) hours as needed for nausea or vomiting.    Historical Provider, MD   Physical Exam: Filed Vitals:   12/14/13 0740 12/14/13 1039 12/14/13 1247 12/14/13 1451  BP:   149/74 156/92 164/94  Pulse:  103 111 105  Temp:  97.8 F (36.6 C) 97.2 F (36.2 C)   TempSrc:  Oral Oral   Resp: SpO2:  93% 92% 94%    Physical Exam  Constitutional: Appears well-developed and well-nourished. No distress.  HENT: Normocephalic. No tonsillar erythema or exudates Eyes: Conjunctivae and EOM are normal. PERRLA, no scleral icterus.  Neck: Normal ROM. Neck supple. No JVD. No tracheal deviation. No thyromegaly.  CVS: RRR, S1/S2 +, no murmurs, no gallops, no carotid bruit.  Pulmonary: Effort and breath sounds normal, no stridor, rhonchi, wheezes, rales.  Abdominal: Soft. BS +,  no distension, tenderness, rebound or guarding.  Musculoskeletal: Normal range of motion. No edema and no tenderness.  Lymphadenopathy: No lymphadenopathy noted, cervical, inguinal. Neuro: Alert. Normal reflexes, muscle tone coordination. No focal neurologic deficits. Skin: Skin is warm and dry. No rash noted. Not diaphoretic. No erythema. No pallor.  Psychiatric: Normal mood and affect. Behavior, judgment, thought content normal.   Labs on Admission:  Basic Metabolic Panel:  Recent Labs Lab 12/14/13 0535  NA 134*  K 3.8  CL 95*  CO2 25  GLUCOSE 117*  BUN 10  CREATININE 0.52  CALCIUM 9.6  Liver Function Tests:  Recent Labs Lab 12/14/13 0535  AST 21  ALT 15  ALKPHOS 98  BILITOT 0.4  PROT 6.9  ALBUMIN 3.4*    Recent Labs Lab 12/14/13 0535  LIPASE 12   No results found for this basename: AMMONIA,  in the last 168 hours CBC:  Recent Labs Lab 12/14/13 0535  WBC 7.8  NEUTROABS 5.5  HGB 13.3  HCT 38.9  MCV 95.8  PLT 317   Cardiac Enzymes: No results found for this basename: CKTOTAL, CKMB, CKMBINDEX, TROPONINI,  in the last 168 hours BNP: No components found with this basename: POCBNP,  CBG: No results found for this basename: GLUCAP,  in the last 168 hours  If 7PM-7AM, please contact night-coverage [email or web address] Password [facility] 12/14/2013, 3:05  PM

## 2013-12-14 NOTE — ED Provider Notes (Signed)
Medical screening examination/treatment/procedure(s) were conducted as a shared visit with non-physician practitioner(s) and myself.  I personally evaluated the patient during the encounter.  Abdomen soft with RLQ tenderness. No hernia palpated, but patient would not sit up (which usually causes hernia to become apparent) due to pain.    Hanley Seamen, MD 12/14/13 [id number]

## 2013-12-14 NOTE — ED Notes (Signed)
Pt c/o abd pain / pressure to lower abd; pt c/o tenderness to RLQ; pt reports recent UTI and on anbx; pt states that she has had urinary frequency and urgency throughout the night; son reports recent surgery and ? Constipations issues

## 2013-12-14 NOTE — Progress Notes (Signed)
[facility]  Patient Status: Active (receiving services up to time of hospitalization)  [facility] is providing the following services: RN, PT, OT and HHA  If patient discharges after hours, please call [phone].   Lanae Crumbly 12/14/2013, 3:20 PM

## 2013-12-14 NOTE — Progress Notes (Signed)
  CARE MANAGEMENT ED NOTE 12/14/2013  Patient:  Nancy Meyer, Nancy Meyer   Account Number:  [id number]  Date Initiated:  12/14/2013  Documentation initiated by:  Edd Arbour  Subjective/Objective Assessment:   79 yr old [employer] [place] pt c/o abd pain / pressure to lower abd; pt c/o tenderness to RLQ; pt reports recent UTI and on anbx; pt states that she has had urinary frequency and urgency throughout the night; son reports recent     Subjective/Objective Assessment Detail:   [facility]  for [facility], PT per son at bedside    pcp Pearson Grippe     Action/Plan:   Action/Plan Detail:   Baxter Hire of [facility] informed of observation admission and will follow pt for d/c needs   Anticipated DC Date:  12/15/2013     Status Recommendation to Physician:   Result of Recommendation:    Other ED Services  Consult Working Plan    DC Planning Services  Other  Outpatient Services - Pt will follow up   [facility]  HOME HEALTH   Choice offered to / List presented to:  C-1 Patient     HH arranged  HH-1 RN  HH-2 PT      HH agency  [facility]    Status of service:  Completed, signed off  ED Comments:   ED Comments Detail:

## 2013-12-14 NOTE — ED Provider Notes (Signed)
CSN: [id number]     Arrival date & time 12/14/13  0507 History   First MD Initiated Contact with Patient 12/14/13 [phone]     Chief Complaint  Patient presents with  . Abdominal Pain     (Consider location/radiation/quality/duration/timing/severity/associated sxs/prior Treatment) HPI Comments: Patient is a 80 year old female with a past medical history of L4 compression fracture, right inguinal hernia, constipation, and hypertension who presents with abdominal pain that started last night. Symptoms started gradually and progressively worsened since the onset. The pain is aching and severe with radiation all over her abdomen. She reports associated dysuria and urinary frequency throughout the night. Patient reports having difficulty starting her stream but once she sat up straight, she was able to urinate without difficulty. Patient denies any other associated symptoms. Movement makes the pain worse. No alleviating factors. Patient did not try anything at home for pain. Patient reports appendectomy when she was 18. No other abdominal surgery.    Past Medical History  Diagnosis Date  . Hypertension   . Osteoporosis   . Macular degeneration   . Arthritis   . Hyperlipidemia    Past Surgical History  Procedure Laterality Date  . Appendectomy    . Eye surgery    . Back surgery     Family History  Problem Relation Age of Onset  . Cancer Daughter     breast   History  Substance Use Topics  . Smoking status: Never Smoker   . Smokeless tobacco: Never Used  . Alcohol Use: No   OB History   Grav Para Term Preterm Abortions TAB SAB Ect Mult Living   Review of Systems  Constitutional: Negative for fever, chills and fatigue.  HENT: Negative for trouble swallowing.   Eyes: Negative for visual disturbance.  Respiratory: Negative for shortness of breath.   Cardiovascular: Negative for chest pain and palpitations.  Gastrointestinal: Positive for abdominal pain and  constipation. Negative for nausea, vomiting and diarrhea.  Genitourinary: Positive for dysuria and frequency. Negative for difficulty urinating.  Musculoskeletal: Negative for arthralgias and neck pain.  Skin: Negative for color change.  Neurological: Negative for dizziness and weakness.  Psychiatric/Behavioral: Negative for dysphoric mood.      Allergies  Macrobid  Home Medications   Prior to Admission medications   Medication Sig Start Date End Date Taking? Authorizing Provider  ALPRAZolam (XANAX) 0.25 MG tablet Take 0.25 mg by mouth 2 (two) times daily as needed for anxiety.   Yes Historical Provider, MD  bisacodyl (DULCOLAX) 5 MG EC tablet Take 5 mg by mouth daily as needed for mild constipation.    Yes Historical Provider, MD  buPROPion (WELLBUTRIN SR) 150 MG 12 hr tablet Take 150 mg by mouth daily.    Yes Historical Provider, MD  calcium citrate-vitamin D 200-200 MG-UNIT TABS Take 1 tablet by mouth daily.    Yes Historical Provider, MD  cholecalciferol (VITAMIN D) 1000 UNITS tablet Take 1,000 Units by mouth every morning.    Yes Historical Provider, MD  ciprofloxacin (CIPRO) 500 MG tablet Take 500 mg by mouth 2 (two) times daily.   Yes Historical Provider, MD  docusate sodium 100 MG CAPS Take 100 mg by mouth 2 (two) times daily. 11/05/13  Yes Nishant Dhungel, MD  HYDROcodone-acetaminophen (NORCO/VICODIN) 5-325 MG per tablet Take 1 tablet by mouth every 4 (four) hours as needed for severe pain.    Yes Historical Provider, MD  lidocaine (LIDODERM) 5 % Place 1 patch onto the skin daily. Remove & Discard patch within 12 hours or as directed by MD 11/05/13  Yes Nishant Dhungel, MD  losartan (COZAAR) 100 MG tablet Take 100 mg by mouth daily.  07/12/11  Yes Historical Provider, MD  morphine (MSIR) 15 MG tablet Take 7.5 mg by mouth every 4 (four) hours as needed for severe pain.    Yes Historical Provider, MD  Multiple Vitamins-Minerals (PRESERVISION AREDS 2 PO) Take 2 tablets by mouth daily.    Yes Historical Provider, MD  magnesium hydroxide (PHILLIPS CHEWS) 311 MG CHEW chewable tablet Chew 622 mg by mouth every 4 (four) hours as needed (constipation).    Historical Provider, MD  promethazine (PHENERGAN) 25 MG tablet Take 25 mg by mouth every 8 (eight) hours as needed for nausea or vomiting.    Historical Provider, MD   BP 152/80  Pulse 97  Temp(Src) 97.8 F (36.6 C) (Oral)  Resp 20  SpO2 99% Physical Exam  Nursing note and vitals reviewed. Constitutional: She is oriented to person, place, and time. She appears well-developed and well-nourished. No distress.  HENT:  Head: Normocephalic and atraumatic.  Eyes: Conjunctivae and EOM are normal. Pupils are equal, round, and reactive to light. No scleral icterus.  Neck: Normal range of motion.  Cardiovascular: Normal rate and regular rhythm.  Exam reveals no gallop and no friction rub.   No murmur heard. Pulmonary/Chest: Effort normal and breath sounds normal. She has no wheezes. She has no rales. She exhibits no tenderness.  Abdominal: Soft. She exhibits no distension. There is tenderness. There is no rebound and no guarding.  Generalized abdominal tenderness to palpation that is most notable in the RLQ. No peritoneal signs.   Genitourinary:  No CVA tenderness.   Musculoskeletal: Normal range of motion.  Neurological: She is alert and oriented to person, place, and time. Coordination normal.  Speech is goal-oriented. Moves limbs without ataxia.   Skin: Skin is warm and dry.  Psychiatric: She has a normal mood and affect. Her behavior is normal.    ED Course  Procedures (including critical care time) Labs Review Labs Reviewed  COMPREHENSIVE METABOLIC PANEL - Abnormal; Notable for the following:    Sodium 134 (*)    Chloride 95 (*)    Glucose, Bld 117 (*)    Albumin 3.4 (*)    GFR calc non Af Amer 81 (*)    All other components within normal limits  URINALYSIS, ROUTINE W REFLEX MICROSCOPIC - Abnormal; Notable for the  following:    APPearance CLOUDY (*)    Ketones, ur 15 (*)    All other components within normal limits  CBC WITH DIFFERENTIAL  LIPASE, BLOOD  I-STAT CG4 LACTIC ACID, ED    Imaging Review Ct Abdomen Pelvis W Contrast  12/14/2013   CLINICAL DATA:  Global abdominal pain with nausea. Evaluate for obstruction.  EXAM: CT ABDOMEN AND PELVIS WITH CONTRAST  TECHNIQUE: Multidetector CT imaging of the abdomen and pelvis was performed using the standard protocol following bolus administration of intravenous contrast.  CONTRAST:  80mL OMNIPAQUE IOHEXOL 300 MG/ML  SOLN  COMPARISON:  CT 04/17/2006.  FINDINGS: Visualization of the lower thorax demonstrates normal heart size. Subpleural consolidative opacity within the right greater than left lower lobes most suggestive of atelectasis.  Liver is normal in size and contour. Gallbladder is distended. Common bile duct measures 8 mm. The spleen is unremarkable. Pancreas is unremarkable. The pancreatic duct is visualized. Normal adrenal glands.  Kidneys enhance symmetrically with contrast. No hydronephrosis. Sub cm low-attenuation lesion within the peripheral aspect of the interpolar region of the right kidney too small to characterize. Additional cortically based sub cm lesion within the inferior pole of the right kidney too small to characterize. Too small to characterize low-attenuation lesion superior pole left kidney.  Scattered calcified atherosclerotic plaque involving and otherwise normal caliber abdominal aorta. No retroperitoneal lymphadenopathy. Urinary bladder is low-lying in the pelvis.  Marked sigmoid colonic diverticulosis. No definite CT evidence for acute diverticulitis. The descending colon is relatively decompressed. There is a large amount of stool demonstrated from the cecum, ascending and transverse colons to the level of the splenic flexure. Oral contrast material is demonstrated within the distal small bowel. No evidence for small bowel obstruction.  No  free fluid or free intraperitoneal air.  Status post kyphoplasty of L4 vertebral body compression fracture. The compression fracture appears somewhat progressed when compared to prior MRI new wedge compression deformity of the L1 vertebral body with approximately 50% height loss.  IMPRESSION: 1. Large amount of stool throughout the colon compatible with severe constipation. 2. Distended gallbladder without surrounding inflammatory change to suggest acute cholecystitis. 3. Interval progression of L4 vertebral body compression fracture. New compression fracture of the L1 vertebral body. 4. Subpleural ground-glass and consolidative opacities within the right greater than left lower lobes suggestive of atelectasis. Infection not excluded.   Electronically Signed   By: Annia Belt M.D.   On: 12/14/2013 10:28     EKG Interpretation None      MDM   Final diagnoses:  Constipation, unspecified constipation type  Abdominal pain, unspecified abdominal location    6:19 AM Labs unremarkable for acute changes. Urinalysis pending. CT abdomen pelvis pending. Vitals stable and patient afebrile. Patient declined pain medication at this time.   2:11 PM Pain not controlled here in the ED. Enema tried without relief. Patient will be admitted to Dr. Elisabeth Pigeon.   Emilia Beck, PA-C 12/14/13 1411

## 2013-12-15 ENCOUNTER — Ambulatory Visit (HOSPITAL_COMMUNITY): Admission: RE | Admit: 2013-12-15 | Payer: Medicare Other | Source: Ambulatory Visit

## 2013-12-15 DIAGNOSIS — S32009A Unspecified fracture of unspecified lumbar vertebra, initial encounter for closed fracture: Secondary | ICD-10-CM

## 2013-12-15 DIAGNOSIS — S32010A Wedge compression fracture of first lumbar vertebra, initial encounter for closed fracture: Secondary | ICD-10-CM | POA: Diagnosis present

## 2013-12-15 DIAGNOSIS — E43 Unspecified severe protein-calorie malnutrition: Secondary | ICD-10-CM | POA: Diagnosis present

## 2013-12-15 DIAGNOSIS — E876 Hypokalemia: Secondary | ICD-10-CM | POA: Diagnosis not present

## 2013-12-15 DIAGNOSIS — I10 Essential (primary) hypertension: Secondary | ICD-10-CM

## 2013-12-15 LAB — COMPREHENSIVE METABOLIC PANEL
ALT: 13 U/L (ref 0–35)
AST: 15 U/L (ref 0–37)
Albumin: 3 g/dL — ABNORMAL LOW (ref 3.5–5.2)
Alkaline Phosphatase: 89 U/L (ref 39–117)
Anion gap: 13 (ref 5–15)
BILIRUBIN TOTAL: 0.3 mg/dL (ref 0.3–1.2)
BUN: 13 mg/dL (ref 6–23)
CO2: 25 meq/L (ref 19–32)
CREATININE: 0.48 mg/dL — AB (ref 0.50–1.10)
Calcium: 8.6 mg/dL (ref 8.4–10.5)
Chloride: 97 mEq/L (ref 96–112)
GFR calc Af Amer: >90 mL/min (ref >90)
GFR, EST NON AFRICAN AMERICAN: 83 mL/min — AB (ref >90)
Glucose, Bld: 117 mg/dL — ABNORMAL HIGH (ref 70–99)
Potassium: 3.3 mEq/L — ABNORMAL LOW (ref 3.7–5.3)
Sodium: 135 mEq/L — ABNORMAL LOW (ref 137–147)
Total Protein: 6.1 g/dL (ref 6.0–8.3)

## 2013-12-15 LAB — CBC
HCT: 34.8 % — ABNORMAL LOW (ref 36.0–46.0)
Hemoglobin: 12 g/dL (ref 12.0–15.0)
MCH: 32.4 pg (ref 26.0–34.0)
MCHC: 34.5 g/dL (ref 30.0–36.0)
MCV: 94.1 fL (ref 78.0–100.0)
PLATELETS: 355 10*3/uL (ref 150–400)
RBC: 3.7 MIL/uL — ABNORMAL LOW (ref 3.87–5.11)
RDW: 12.8 % (ref 11.5–15.5)
WBC: 6 10*3/uL (ref 4.0–10.5)

## 2013-12-15 MED ORDER — CIPROFLOXACIN HCL 500 MG PO TABS
500.0000 mg | ORAL_TABLET | Freq: Every day | ORAL | Status: DC
  Filled 2013-12-15: qty 1

## 2013-12-15 MED ORDER — ACETAMINOPHEN 325 MG PO TABS
650.0000 mg | ORAL_TABLET | Freq: Four times a day (QID) | ORAL | Status: DC | PRN
  Administered 2013-12-18: 650 mg via ORAL
  Filled 2013-12-15: qty 2

## 2013-12-15 MED ORDER — POLYETHYLENE GLYCOL 3350 17 G PO PACK
17.0000 g | PACK | Freq: Three times a day (TID) | ORAL | Status: DC
  Administered 2013-12-15 – 2013-12-19 (×12): 17 g via ORAL
  Filled 2013-12-15 (×15): qty 1

## 2013-12-15 MED ORDER — SENNA 8.6 MG PO TABS
2.0000 | ORAL_TABLET | Freq: Two times a day (BID) | ORAL | Status: DC
  Administered 2013-12-15 – 2013-12-19 (×8): 17.2 mg via ORAL
  Filled 2013-12-15 (×8): qty 2

## 2013-12-15 MED ORDER — HYDROCODONE-ACETAMINOPHEN 5-325 MG PO TABS
1.0000 | ORAL_TABLET | Freq: Four times a day (QID) | ORAL | Status: DC | PRN
  Administered 2013-12-15 – 2013-12-19 (×8): 1 via ORAL
  Filled 2013-12-15 (×8): qty 1

## 2013-12-15 MED ORDER — MILK AND MOLASSES ENEMA
1.0000 | Freq: Once | RECTAL | Status: AC
  Administered 2013-12-15: 250 mL via RECTAL
  Filled 2013-12-15: qty 250

## 2013-12-15 MED ORDER — LIDOCAINE 5 % EX PTCH
1.0000 | MEDICATED_PATCH | CUTANEOUS | Status: DC
  Administered 2013-12-15 – 2013-12-18 (×4): 1 via TRANSDERMAL
  Filled 2013-12-15 (×5): qty 1

## 2013-12-15 MED ORDER — ENSURE COMPLETE PO LIQD
237.0000 mL | ORAL | Status: DC
  Administered 2013-12-17: 237 mL via ORAL

## 2013-12-15 MED ORDER — HYDROCODONE-ACETAMINOPHEN 5-325 MG PO TABS
1.0000 | ORAL_TABLET | ORAL | Status: DC | PRN
  Administered 2013-12-15: 1 via ORAL
  Filled 2013-12-15: qty 1

## 2013-12-15 NOTE — Progress Notes (Signed)
INITIAL NUTRITION ASSESSMENT  DOCUMENTATION CODES Per approved criteria  -Non-severe (moderate) malnutrition in the context of chronic illness -Underweight  Pt meets criteria for moderate  MALNUTRITION in the context of chronic illness as evidenced by 5% body weight loss in one month, PO intake < 75% for one month.   INTERVENTION: -Recommend Ensure Complete once daily -Encouraged intake of fiber and fluids for constipation -RD to continue to monitor  NUTRITION DIAGNOSIS:o Inadequate ral intake related to back pain/constipation/narcotic use as evidenced by decreased PO intake, wt loss.   Goal: Pt to meet >/= 90% of their estimated nutrition needs    Monitor:  Total protein/energy intake, labs, weights   Reason for Assessment: MST  80 y.o. female  Admitting Dx: <principal problem not specified>  ASSESSMENT: 80 year old female with past medical history of constipation, depression, hypertension who presented to [facility] ED 12/14/2013 with worsening Lower and mid abdominal pain and constipation for past few days prior to this admission  -Pt's family reported decreased appetite d/t back pain, lethargy r/t narcotics, and constipation -Diet recall indicates pt snacks throughout the day on fruits, and consumes mostly dairy products. Family had considered implementing Ensure/Boost d/t decreased intake; however had not yet done so -Current PO intake approximately 50-60%, eating small amounts of each food item (eggs/bacon/toast/milk) -Encouraged intake of fiber and fluids to assist in constipation -Was last seen by RD during previous admit in 10/2013; pt with 50% PO intake. Weight has decreased 5 lbs in past one month (5% body weight loss, significant for time frame) -Pt family willing to promote Ensure supplement once daily   Height: Ht Readings from Last 1 Encounters:  12/14/13  (1.6 m)    Weight: Wt Readings from Last 1 Encounters:  12/14/13 88 lb (39.917 kg)    Ideal Body  Weight: 115 lbs  % Ideal Body Weight: 77%  Wt Readings from Last 10 Encounters:  12/14/13 88 lb (39.917 kg)  12/04/13 86 lb 9.6 oz (39.282 kg)  11/27/13 90 lb (40.824 kg)  11/06/13 92 lb 12.8 oz (42.094 kg)  11/04/13 93 lb 14.7 oz (42.6 kg)  08/09/11 97 lb 6.4 oz (44.18 kg)  02/28/11 108 lb (48.988 kg)    Usual Body Weight: approximately 95 lbs  % Usual Body Weight: 95%  BMI:  Body mass index is 15.59 kg/(m^2). Underweight  Estimated Nutritional Needs: Kcal: 1300-1500 Protein: 55-65 gram Fluid: >/=1500 ml/daily  Skin: WDL  Diet Order: General  EDUCATION NEEDS: -Education needs addressed   Intake/Output Summary (Last 24 hours) at 12/15/13 1031 Last data filed at 12/15/13 0918  Gross per 24 hour  Intake    240 ml  Output    401 ml  Net   -161 ml    Last BM: 9/20   Labs:   Recent Labs Lab 12/14/13 0535 12/15/13 0418  NA 134* 135*  K 3.8 3.3*  CL 95* 97  CO2 25 25  BUN 10 13  CREATININE 0.52 0.48*  CALCIUM 9.6 8.6  GLUCOSE 117* 117*    CBG (last 3)  No results found for this basename: GLUCAP,  in the last 72 hours  Scheduled Meds: . antiseptic oral rinse  7 mL Mouth Rinse BID  . buPROPion  150 mg Oral Daily  . cholecalciferol  1,000 Units Oral Daily  . ciprofloxacin  500 mg Oral BID  . docusate sodium  100 mg Oral BID  . Influenza vac split quadrivalent PF  0.5 mL Intramuscular Tomorrow-1000  . lactulose  10 g Oral BID  . losartan  100 mg Oral Daily    Continuous Infusions: . sodium chloride 75 mL/hr at 12/15/13 0757    Past Medical History  Diagnosis Date  . Hypertension   . Osteoporosis   . Macular degeneration   . Arthritis   . Hyperlipidemia     Past Surgical History  Procedure Laterality Date  . Appendectomy    . Eye surgery    . Back surgery      Lloyd Huger MS RD LDN Clinical Dietitian [phone]

## 2013-12-15 NOTE — Progress Notes (Signed)
Patient ID: Nancy Meyer, female   DOB: 08-Apr-1920, 80 y.o.   MRN: [id number] Aware of request for L1 VP/KP on pt . She is familiar to our service from recent L4 VP on 11/27/13. Imaging studies have been reviewed by Dr. Corliss Skains and he feels pt is candidate for procedure. Awaiting insurance approval. Will recheck status in am and schedule accordingly once approval received. Family aware of plans. In the meantime will make pt NPO after midnight in case procedure can be done at [facility] on 9/23.

## 2013-12-15 NOTE — Progress Notes (Signed)
PROGRESS NOTE    Nancy Meyer ZOX:[id number] DOB: [birth year] DOA: 12/14/2013 PCP: Pearson Grippe, MD  HPI/Brief narrative 80 year old female patient with history of HTN, HLD, osteoporosis, L4 kyphoplasty 2 weeks ago, discharge from SNF a week ago, presented to ED with complaints of worsening low back pain radiating to left leg, abdominal bloating and constipation. Family tried several home remedies for constipation without success. Appetite decreased. Patient mostly bedbound at this time. In the ED, CT abdomen showed large amount of stool throughout the colon compatible with severe constipation and new L1 compression fracture.   Assessment/Plan:  1. Severe constipation: Secondary to immobility, poor oral intake and narcotic pain medications. No significant relief with mineral oil enema in ED. Will add MiraLAX 17 g 3 times a day, senna 2 tabs twice a day and milk and molasses enema x1. Advised patient and family that this is one of important side effects of narcotics and would like to minimize as tolerated. 2. Acute on chronic back pain/new L1 compression fracture/s/p L4 kyphoplasty: Discussed with Dr. Corliss Skains evaluate for possible KP/VP. 3. Hypokalemia: Replace and follow 4. Hypertension: Mildly uncontrolled. Continue losartan. 5. Depression: Continue Wellbutrin 6. Non-severe (moderate) malnutrition in the context of chronic illness: Management per dietitian.   Code Status: Full Family Communication: Discussed with patient's daughter, son, son-in-law (Mr. Loree Fee at [facility]) and extended family. Disposition Plan: To be determined.   Consultants:  Interventional radiology  Procedures:  None  Antibiotics:  None   Subjective: Back pain, intermittently radiating to left leg. Abdominal bloating. Decreased appetite. No nausea or vomiting. Denies abdominal pain.  Objective: Filed Vitals:   12/14/13 1504 12/14/13 2125 12/15/13 0554 12/15/13 1358  BP: 147/73  137/65 164/77 161/73  Pulse: 107 105 106 101  Temp: 98.4 F (36.9 C) 98.2 F (36.8 C) 97.7 F (36.5 C) 98.9 F (37.2 C)  TempSrc: Oral Oral Oral Oral  Resp: Height:  (1.6 m)     Weight: 39.917 kg (88 lb)     SpO2: 97% 99% 97% 98%    Intake/Output Summary (Last 24 hours) at 12/15/13 1506 Last data filed at 12/15/13 8119  Gross per 24 hour  Intake    240 ml  Output    400 ml  Net   -160 ml   Filed Weights   12/14/13 1504  Weight: 39.917 kg (88 lb)     Exam:  General exam: Elderly small built and frail female patient lying comfortably in bed. Respiratory system: Clear. No increased work of breathing. Cardiovascular system: S1 & S2 heard, RRR. No JVD, murmurs, gallops, clicks or pedal edema. Gastrointestinal system: Abdomen is mildly distended, soft and nontender. Normal bowel sounds heard. Central nervous system: Alert and oriented. No focal neurological deficits. Extremities: Symmetric 5 x 5 power. MSS: No point tenderness or deformity. Has some tenderness in the lower lumbar paraspinal area   Data Reviewed: Basic Metabolic Panel:  Recent Labs Lab 12/14/13 0535 12/15/13 0418  NA 134* 135*  K 3.8 3.3*  CL 95* 97  CO2 25 25  GLUCOSE 117* 117*  BUN 10 13  CREATININE 0.52 0.48*  CALCIUM 9.6 8.6   Liver Function Tests:  Recent Labs Lab 12/14/13 0535 12/15/13 0418  AST 21 15  ALT 15 13  ALKPHOS 98 89  BILITOT 0.4 0.3  PROT 6.9 6.1  ALBUMIN 3.4* 3.0*    Recent Labs Lab 12/14/13 0535  LIPASE 12   No results found for this  basename: AMMONIA,  in the last 168 hours CBC:  Recent Labs Lab 12/14/13 0535 12/15/13 0418  WBC 7.8 6.0  NEUTROABS 5.5  --   HGB 13.3 12.0  HCT 38.9 34.8*  MCV 95.8 94.1  PLT 317 355   Cardiac Enzymes: No results found for this basename: CKTOTAL, CKMB, CKMBINDEX, TROPONINI,  in the last 168 hours BNP (last 3 results) No results found for this basename: PROBNP,  in the last 8760 hours CBG: No results  found for this basename: GLUCAP,  in the last 168 hours  No results found for this or any previous visit (from the past 240 hour(s)).       Studies: Ct Abdomen Pelvis W Contrast  12/14/2013   CLINICAL DATA:  Global abdominal pain with nausea. Evaluate for obstruction.  EXAM: CT ABDOMEN AND PELVIS WITH CONTRAST  TECHNIQUE: Multidetector CT imaging of the abdomen and pelvis was performed using the standard protocol following bolus administration of intravenous contrast.  CONTRAST:  80mL OMNIPAQUE IOHEXOL 300 MG/ML  SOLN  COMPARISON:  CT 04/17/2006.  FINDINGS: Visualization of the lower thorax demonstrates normal heart size. Subpleural consolidative opacity within the right greater than left lower lobes most suggestive of atelectasis.  Liver is normal in size and contour. Gallbladder is distended. Common bile duct measures 8 mm. The spleen is unremarkable. Pancreas is unremarkable. The pancreatic duct is visualized. Normal adrenal glands.  Kidneys enhance symmetrically with contrast. No hydronephrosis. Sub cm low-attenuation lesion within the peripheral aspect of the interpolar region of the right kidney too small to characterize. Additional cortically based sub cm lesion within the inferior pole of the right kidney too small to characterize. Too small to characterize low-attenuation lesion superior pole left kidney.  Scattered calcified atherosclerotic plaque involving and otherwise normal caliber abdominal aorta. No retroperitoneal lymphadenopathy. Urinary bladder is low-lying in the pelvis.  Marked sigmoid colonic diverticulosis. No definite CT evidence for acute diverticulitis. The descending colon is relatively decompressed. There is a large amount of stool demonstrated from the cecum, ascending and transverse colons to the level of the splenic flexure. Oral contrast material is demonstrated within the distal small bowel. No evidence for small bowel obstruction.  No free fluid or free intraperitoneal  air.  Status post kyphoplasty of L4 vertebral body compression fracture. The compression fracture appears somewhat progressed when compared to prior MRI new wedge compression deformity of the L1 vertebral body with approximately 50% height loss.  IMPRESSION: 1. Large amount of stool throughout the colon compatible with severe constipation. 2. Distended gallbladder without surrounding inflammatory change to suggest acute cholecystitis. 3. Interval progression of L4 vertebral body compression fracture. New compression fracture of the L1 vertebral body. 4. Subpleural ground-glass and consolidative opacities within the right greater than left lower lobes suggestive of atelectasis. Infection not excluded.   Electronically Signed   By: Annia Belt M.D.   On: 12/14/2013 10:28        Scheduled Meds: . antiseptic oral rinse  7 mL Mouth Rinse BID  . buPROPion  150 mg Oral Daily  . cholecalciferol  1,000 Units Oral Daily  . [START ON 12/16/2013] ciprofloxacin  500 mg Oral QAC breakfast  . docusate sodium  100 mg Oral BID  . feeding supplement (ENSURE COMPLETE)  237 mL Oral Q24H  . Influenza vac split quadrivalent PF  0.5 mL Intramuscular Tomorrow-1000  . lidocaine  1 patch Transdermal Q24H  . losartan  100 mg Oral Daily  . milk and molasses  1 enema Rectal  Once  . polyethylene glycol  17 g Oral TID  . senna  2 tablet Oral BID   Continuous Infusions: . sodium chloride 75 mL/hr at 12/15/13 0757    Active Problems:   Constipation   Protein-calorie malnutrition, severe    Time spent: 40 minutes.    Marcellus Scott, MD, FACP, FHM. [facility] Pager [phone]  If 7PM-7AM, please contact night-coverage [email or web address] Password [email or web address] 12/15/2013, 3:06 PM    LOS: 1 day

## 2013-12-16 ENCOUNTER — Encounter (HOSPITAL_COMMUNITY): Payer: Self-pay | Admitting: Radiology

## 2013-12-16 ENCOUNTER — Ambulatory Visit (HOSPITAL_COMMUNITY)
Admit: 2013-12-16 | Discharge: 2013-12-16 | Disposition: A | Discharge status: Home / Self Care | Payer: Medicare Other | Attending: Radiology | Admitting: Radiology

## 2013-12-16 LAB — BASIC METABOLIC PANEL
ANION GAP: 12 (ref 5–15)
BUN: 8 mg/dL (ref 6–23)
CALCIUM: 8.5 mg/dL (ref 8.4–10.5)
CO2: 25 meq/L (ref 19–32)
Chloride: 96 mEq/L (ref 96–112)
Creatinine, Ser: 0.45 mg/dL — ABNORMAL LOW (ref 0.50–1.10)
GFR calc Af Amer: >90 mL/min (ref >90)
GFR calc non Af Amer: 85 mL/min — ABNORMAL LOW (ref >90)
Glucose, Bld: 105 mg/dL — ABNORMAL HIGH (ref 70–99)
Potassium: 3 mEq/L — ABNORMAL LOW (ref 3.7–5.3)
SODIUM: 133 meq/L — AB (ref 137–147)

## 2013-12-16 LAB — CBC
HCT: 32.9 % — ABNORMAL LOW (ref 36.0–46.0)
HEMOGLOBIN: 11.2 g/dL — AB (ref 12.0–15.0)
MCH: 32.4 pg (ref 26.0–34.0)
MCHC: 34 g/dL (ref 30.0–36.0)
MCV: 95.1 fL (ref 78.0–100.0)
PLATELETS: 325 10*3/uL (ref 150–400)
RBC: 3.46 MIL/uL — AB (ref 3.87–5.11)
RDW: 13 % (ref 11.5–15.5)
WBC: 6.3 10*3/uL (ref 4.0–10.5)

## 2013-12-16 LAB — MAGNESIUM: Magnesium: 2.1 mg/dL (ref 1.5–2.5)

## 2013-12-16 MED ORDER — SODIUM CHLORIDE 0.9 % IV SOLN
INTRAVENOUS | Status: AC
  Administered 2013-12-16: 17:00:00 via INTRAVENOUS

## 2013-12-16 MED ORDER — IOHEXOL 300 MG/ML  SOLN: 50.0000 mL | Freq: Once | INTRAMUSCULAR | Status: DC | PRN

## 2013-12-16 MED ORDER — BUPIVACAINE HCL (PF) 0.25 % IJ SOLN
INTRAMUSCULAR | Status: AC
  Filled 2013-12-16: qty 60

## 2013-12-16 MED ORDER — INFLUENZA VAC SPLIT QUAD 0.5 ML IM SUSY
0.5000 mL | PREFILLED_SYRINGE | INTRAMUSCULAR | Status: AC
  Administered 2013-12-17: 0.5 mL via INTRAMUSCULAR
  Filled 2013-12-16 (×2): qty 0.5

## 2013-12-16 MED ORDER — FLUMAZENIL 0.5 MG/5ML IV SOLN
INTRAVENOUS | Status: DC | PRN
  Administered 2013-12-16: 0.2 mg via INTRAVENOUS

## 2013-12-16 MED ORDER — TOBRAMYCIN SULFATE 1.2 G IJ SOLR
INTRAMUSCULAR | Status: AC
  Filled 2013-12-16: qty 1.2

## 2013-12-16 MED ORDER — BUPIVACAINE HCL (PF) 0.25 % IJ SOLN
INTRAMUSCULAR | Status: DC
Start: 2013-12-16 — End: 2013-12-16
  Filled 2013-12-16: qty 30

## 2013-12-16 MED ORDER — CEFAZOLIN (ANCEF) 1 G IV SOLR
2.0000 g | INTRAVENOUS | Status: DC
  Filled 2013-12-16: qty 2

## 2013-12-16 MED ORDER — POTASSIUM CHLORIDE CRYS ER 20 MEQ PO TBCR
30.0000 meq | EXTENDED_RELEASE_TABLET | ORAL | Status: AC
  Administered 2013-12-16 (×2): 30 meq via ORAL
  Filled 2013-12-16 (×2): qty 1

## 2013-12-16 MED ORDER — FENTANYL CITRATE 0.05 MG/ML IJ SOLN
INTRAMUSCULAR | Status: AC
  Filled 2013-12-16: qty 4

## 2013-12-16 MED ORDER — HYDRALAZINE HCL 20 MG/ML IJ SOLN
INTRAMUSCULAR | Status: AC
  Filled 2013-12-16: qty 1

## 2013-12-16 MED ORDER — CEFAZOLIN SODIUM-DEXTROSE 2-3 GM-% IV SOLR
INTRAVENOUS | Status: AC
  Administered 2013-12-16: 2000 mg
  Filled 2013-12-16: qty 50

## 2013-12-16 MED ORDER — BUPIVACAINE HCL (PF) 0.25 % IJ SOLN
INTRAMUSCULAR | Status: AC
  Filled 2013-12-16: qty 30

## 2013-12-16 MED ORDER — MIDAZOLAM HCL 2 MG/2ML IJ SOLN
INTRAMUSCULAR | Status: AC
  Filled 2013-12-16: qty 4

## 2013-12-16 MED ORDER — NALOXONE HCL 1 MG/ML IJ SOLN
INTRAMUSCULAR | Status: AC
  Filled 2013-12-16: qty 2

## 2013-12-16 MED ORDER — HYDROMORPHONE HCL PF 1 MG/ML IJ SOLN
INTRAMUSCULAR | Status: DC | PRN
Start: 2013-12-16 — End: 2013-12-17
  Administered 2013-12-16: 1 mg via INTRAVENOUS

## 2013-12-16 MED ORDER — FLUMAZENIL 0.5 MG/5ML IV SOLN
INTRAVENOUS | Status: AC
  Filled 2013-12-16: qty 5

## 2013-12-16 MED ORDER — FENTANYL CITRATE 0.05 MG/ML IJ SOLN
INTRAMUSCULAR | Status: DC | PRN
  Administered 2013-12-16 (×2): 25 ug via INTRAVENOUS

## 2013-12-16 MED ORDER — HYDROMORPHONE HCL 1 MG/ML IJ SOLN
INTRAMUSCULAR | Status: AC
  Filled 2013-12-16: qty 2

## 2013-12-16 MED ORDER — MIDAZOLAM HCL 2 MG/2ML IJ SOLN
INTRAMUSCULAR | Status: DC | PRN
  Administered 2013-12-16 (×2): 1 mg via INTRAVENOUS

## 2013-12-16 NOTE — Sedation Documentation (Signed)
Pt with decreased O2 sat, reversed with flumazenil, 0.25mg  with inc in SaO2 and change to NRB mask,

## 2013-12-16 NOTE — Sedation Documentation (Signed)
Dr. Corliss Skains discussing outcome of case w/ family and pt at bedside in radiology nurses station.  Questions answered.

## 2013-12-16 NOTE — Progress Notes (Signed)
PT Cancellation Note  Patient Details Name: Nancy Meyer MRN: [id number] DOB: 10-Jan-1921   Cancelled Treatment:    Reason Eval/Treat Not Completed: Medical issues which prohibited therapy (pt scheduled for L1 kyphoplasty today, will follow. )   [name] 12/16/2013, 11:14 AM [phone]

## 2013-12-16 NOTE — Procedures (Signed)
S/P LKP

## 2013-12-16 NOTE — Progress Notes (Signed)
Patient has returned from [facility]. She is stable. Order is for bedrest for next 3 hours. Patient states she wants to complete enema after bedrest is done. Will report this to night shift.

## 2013-12-16 NOTE — Consult Note (Signed)
Reason for Consult:L1 compression fracture Consulting Radiologist: Estanislado Pandy  Referring Physician: Algis Liming   HPI: Nancy Meyer is an 80 y.o. female who had previous L4 compression Fx treated by VP on on 9/4. She developed new onset back pain and is found to have an acute fracture at the L1 level. She is now approved to have VP/KP of this fracture. Pain is controlled with rest and some pain meds but she is not able to much of any activity. Denies acute illness but is dealing with some constipation issues. Meds, labs, imaging reviewed.  Past Medical History:  Past Medical History  Diagnosis Date  . Hypertension   . Osteoporosis   . Macular degeneration   . Arthritis   . Hyperlipidemia     Surgical History:  Past Surgical History  Procedure Laterality Date  . Appendectomy    . Eye surgery    . Back surgery      Family History:  Family History  Problem Relation Age of Onset  . Cancer Daughter     breast    Social History:  reports that she has never smoked. She has never used smokeless tobacco. She reports that she does not drink alcohol or use illicit drugs.  Allergies:  Allergies  Allergen Reactions  . Macrobid [Nitrofurantoin] Itching    Medications: Current facility-administered medications:acetaminophen (TYLENOL) tablet 650 mg, 650 mg, Oral, Q6H PRN, Modena Jansky, MD;  ALPRAZolam Duanne Moron) tablet 0.25 mg, 0.25 mg, Oral, BID PRN, Robbie Lis, MD;  antiseptic oral rinse (CPC / CETYLPYRIDINIUM CHLORIDE 0.05%) solution 7 mL, 7 mL, Mouth Rinse, BID, Robbie Lis, MD, 7 mL at 12/16/13 1000 buPROPion [facility] SR) 12 hr tablet 150 mg, 150 mg, Oral, Daily, Robbie Lis, MD, 150 mg at 12/16/13 6789;  cholecalciferol (VITAMIN D) tablet 1,000 Units, 1,000 Units, Oral, Daily, Robbie Lis, MD, 1,000 Units at 12/16/13 0949;  docusate sodium (COLACE) capsule 100 mg, 100 mg, Oral, BID, Robbie Lis, MD, 100 mg at 12/16/13 3810 feeding supplement (ENSURE COMPLETE)  (ENSURE COMPLETE) liquid 237 mL, 237 mL, Oral, Q24H, Hazle Coca, RD;  HYDROcodone-acetaminophen (NORCO/VICODIN) 5-325 MG per tablet 1 tablet, 1 tablet, Oral, Q6H PRN, Modena Jansky, MD, 1 tablet at 12/16/13 0547;  Influenza vac split quadrivalent PF (FLUARIX) injection 0.5 mL, 0.5 mL, Intramuscular, Tomorrow-1000, John L Molpus, MD lidocaine (LIDODERM) 5 % 1 patch, 1 patch, Transdermal, Q24H, Modena Jansky, MD, 1 patch at 12/15/13 1400;  losartan (COZAAR) tablet 100 mg, 100 mg, Oral, Daily, Robbie Lis, MD, 100 mg at 12/16/13 0949;  ondansetron [facility]) injection 4 mg, 4 mg, Intravenous, Q8H PRN, Robbie Lis, MD, 4 mg at 12/15/13 1427;  polyethylene glycol (MIRALAX / GLYCOLAX) packet 17 g, 17 g, Oral, TID, Modena Jansky, MD, 17 g at 12/16/13 0954 potassium chloride (K-DUR,KLOR-CON) CR tablet 30 mEq, 30 mEq, Oral, Q4H, Modena Jansky, MD, 30 mEq at 12/16/13 0949;  senna (SENOKOT) tablet 17.2 mg, 2 tablet, Oral, BID, Modena Jansky, MD, 17.2 mg at 12/16/13 0949  ROS: See HPI for pertinent findings, otherwise complete 10 system review negative.  Physical Exam: Blood pressure 131/70, pulse 94, temperature 98.7 F (37.1 C), temperature source Oral, resp. rate 16, height _0  (1.6 m), weight 87 lb 14.4 oz (39.871 kg), SpO2 99.00%.     Labs: Results for orders placed during the hospital encounter of 12/14/13 (from the past 48 hour(s))  TSH     Status: None   Collection  Time    12/14/13  3:30 PM      Result Value Ref Range   TSH 1.460  0.350 - 4.500 uIU/mL   Comment: Performed at [place] PANEL     Status: Abnormal   Collection Time    12/15/13  4:18 AM      Result Value Ref Range   Sodium 135 (*) 137 - 147 mEq/L   Potassium 3.3 (*) 3.7 - 5.3 mEq/L   Chloride 97  96 - 112 mEq/L   CO2 25  19 - 32 mEq/L   Glucose, Bld 117 (*) 70 - 99 mg/dL   BUN 13  6 - 23 mg/dL   Creatinine, Ser 0.48 (*) 0.50 - 1.10 mg/dL   Calcium 8.6  8.4 - 10.5  mg/dL   Total Protein 6.1  6.0 - 8.3 g/dL   Albumin 3.0 (*) 3.5 - 5.2 g/dL   AST 15  0 - 37 U/L   ALT 13  0 - 35 U/L   Alkaline Phosphatase 89  39 - 117 U/L   Total Bilirubin 0.3  0.3 - 1.2 mg/dL   GFR calc non Af Amer 83 (*) >90 mL/min   GFR calc Af Amer >90  >90 mL/min   Comment: (NOTE)     The eGFR has been calculated using the CKD EPI equation.     This calculation has not been validated in all clinical situations.     eGFR's persistently <90 mL/min signify possible Chronic Kidney     Disease.   Anion gap 13  5 - 15  CBC     Status: Abnormal   Collection Time    12/15/13  4:18 AM      Result Value Ref Range   WBC 6.0  4.0 - 10.5 K/uL   RBC 3.70 (*) 3.87 - 5.11 MIL/uL   Hemoglobin 12.0  12.0 - 15.0 g/dL   HCT 34.8 (*) 36.0 - 46.0 %   MCV 94.1  78.0 - 100.0 fL   MCH 32.4  26.0 - 34.0 pg   MCHC 34.5  30.0 - 36.0 g/dL   RDW 12.8  11.5 - 15.5 %   Platelets 355  150 - 400 K/uL  CBC     Status: Abnormal   Collection Time    12/16/13  4:12 AM      Result Value Ref Range   WBC 6.3  4.0 - 10.5 K/uL   RBC 3.46 (*) 3.87 - 5.11 MIL/uL   Hemoglobin 11.2 (*) 12.0 - 15.0 g/dL   HCT 32.9 (*) 36.0 - 46.0 %   MCV 95.1  78.0 - 100.0 fL   MCH 32.4  26.0 - 34.0 pg   MCHC 34.0  30.0 - 36.0 g/dL   RDW 13.0  11.5 - 15.5 %   Platelets 325  150 - 400 K/uL  BASIC METABOLIC PANEL     Status: Abnormal   Collection Time    12/16/13  4:12 AM      Result Value Ref Range   Sodium 133 (*) 137 - 147 mEq/L   Potassium 3.0 (*) 3.7 - 5.3 mEq/L   Chloride 96  96 - 112 mEq/L   CO2 25  19 - 32 mEq/L   Glucose, Bld 105 (*) 70 - 99 mg/dL   BUN 8  6 - 23 mg/dL   Creatinine, Ser 0.45 (*) 0.50 - 1.10 mg/dL   Calcium 8.5  8.4 - 10.5 mg/dL   GFR  calc non Af Amer 85 (*) >90 mL/min   GFR calc Af Amer >90  >90 mL/min   Comment: (NOTE)     The eGFR has been calculated using the CKD EPI equation.     This calculation has not been validated in all clinical situations.     eGFR's persistently <90 mL/min  signify possible Chronic Kidney     Disease.   Anion gap 12  5 - 15  MAGNESIUM     Status: None   Collection Time    12/16/13  4:12 AM      Result Value Ref Range   Magnesium 2.1  1.5 - 2.5 mg/dL    PT/INR 12.6/0.94  Imaging: CT- Interval progression of L4 vertebral body compression fracture.  New compression fracture of the L1 vertebral body.   Assessment/Plan: L1 compression fracture For VP/KP today Pt to be transported to [facility] for IR procedure, then back to [facility] Procedure, risks, complications, use of sedation and recovery expectations discussed with pt and her son. Labs reviewed. Consent obtained.  Ascencion Dike PA-C 12/16/2013, 10:27 AM

## 2013-12-16 NOTE — Sedation Documentation (Signed)
MD at bedside. 

## 2013-12-16 NOTE — Progress Notes (Addendum)
PROGRESS NOTE    Nancy Meyer UEA:[id number] DOB: [birth year] DOA: 12/14/2013 PCP: Pearson Grippe, MD  HPI/Brief narrative 80 year old female patient with history of HTN, HLD, osteoporosis, L4 kyphoplasty 2 weeks ago, discharge from SNF a week ago, presented to ED with complaints of worsening low back pain radiating to left leg, abdominal bloating and constipation. Family tried several home remedies for constipation without success. Appetite decreased. Patient mostly bedbound at this time. In the ED, CT abdomen showed large amount of stool throughout the colon compatible with severe constipation and new L1 compression fracture.   Assessment/Plan:  1. Severe constipation/fecal impaction: Secondary to immobility, poor oral intake and narcotic pain medications. No significant relief with mineral oil enema in ED & Milk of Molasses enema on 9/22. Added MiraLAX 17 g 3 times a day, senna 2 tabs twice a day and continued Colace. Advised patient and family that this is one of important side effects of narcotics and would like to minimize as tolerated. Trial of soap suds enema. If no relief, may have to consider GI consultation. 2. Acute on chronic back pain/new L1 compression fracture/s/p L4 kyphoplasty: IR planning KP/VP on 9/23..  3. Hypokalemia: Aggressively replace and follow 4. Hypertension: Mildly uncontrolled. Continue losartan. 5. Depression: Continue Wellbutrin 6. Non-severe (moderate) malnutrition in the context of chronic illness: Management per dietitian.   Code Status: Full Family Communication: Discussed with daughter. Disposition Plan: To be determined.   Consultants:  Interventional radiology  Procedures:  None  Antibiotics:  None   Subjective: Difficulty urinating. Small BM after enema yesterday.  Objective: Filed Vitals:   12/15/13 0554 12/15/13 1358 12/15/13 2159 12/16/13 0425  BP: 164/77 161/73 142/70 131/70  Pulse: 106 101 104 94  Temp: 97.7 F (36.5 C)  98.9 F (37.2 C) 98.1 F (36.7 C) 98.7 F (37.1 C)  TempSrc: Oral Oral Oral Oral  Resp: Height:      Weight:    39.871 kg (87 lb 14.4 oz)  SpO2: 97% 98% 97% 99%    Intake/Output Summary (Last 24 hours) at 12/16/13 1259 Last data filed at 12/16/13 1040  Gross per 24 hour  Intake    300 ml  Output    875 ml  Net   -575 ml   Filed Weights   12/14/13 1504 12/16/13 0425  Weight: 39.917 kg (88 lb) 39.871 kg (87 lb 14.4 oz)     Exam:  General exam: Elderly small built and frail female patient lying comfortably in bed. Respiratory system: Clear. No increased work of breathing. Cardiovascular system: S1 & S2 heard, RRR. No JVD, murmurs, gallops, clicks or pedal edema. Gastrointestinal system: Abdomen is mildly distended, soft and nontender. Normal bowel sounds heard. Central nervous system: Alert and oriented. No focal neurological deficits. Extremities: Symmetric 5 x 5 power. MSS: No point tenderness or deformity. Has some tenderness in the lower lumbar paraspinal area   Data Reviewed: Basic Metabolic Panel:  Recent Labs Lab 12/14/13 0535 12/15/13 0418 12/16/13 0412  NA 134* 135* 133*  K 3.8 3.3* 3.0*  CL 95* 97 96  CO2 GLUCOSE 117* 117* 105*  BUN CREATININE 0.52 0.48* 0.45*  CALCIUM 9.6 8.6 8.5  MG  --   --  2.1   Liver Function Tests:  Recent Labs Lab 12/14/13 0535 12/15/13 0418  AST 21 15  ALT 15 13  ALKPHOS 98 89  BILITOT 0.4 0.3  PROT 6.9 6.1  ALBUMIN  3.4* 3.0*    Recent Labs Lab 12/14/13 0535  LIPASE 12   No results found for this basename: AMMONIA,  in the last 168 hours CBC:  Recent Labs Lab 12/14/13 0535 12/15/13 0418 12/16/13 0412  WBC 7.8 6.0 6.3  NEUTROABS 5.5  --   --   HGB 13.3 12.0 11.2*  HCT 38.9 34.8* 32.9*  MCV 95.8 94.1 95.1  PLT 317 355 325   Cardiac Enzymes: No results found for this basename: CKTOTAL, CKMB, CKMBINDEX, TROPONINI,  in the last 168 hours BNP (last 3 results) No results  found for this basename: PROBNP,  in the last 8760 hours CBG: No results found for this basename: GLUCAP,  in the last 168 hours  No results found for this or any previous visit (from the past 240 hour(s)).       Studies: No results found.      Scheduled Meds: . antiseptic oral rinse  7 mL Mouth Rinse BID  . buPROPion  150 mg Oral Daily  . cholecalciferol  1,000 Units Oral Daily  . docusate sodium  100 mg Oral BID  . feeding supplement (ENSURE COMPLETE)  237 mL Oral Q24H  . Influenza vac split quadrivalent PF  0.5 mL Intramuscular Tomorrow-1000  . lidocaine  1 patch Transdermal Q24H  . losartan  100 mg Oral Daily  . polyethylene glycol  17 g Oral TID  . potassium chloride  30 mEq Oral Q4H  . senna  2 tablet Oral BID   Continuous Infusions:    Active Problems:   HTN (hypertension)   Constipation   Protein-calorie malnutrition, severe   Compression fracture of L1 lumbar vertebra   Hypokalemia    Time spent: 30 minutes.    Marcellus Scott, MD, FACP, FHM. [facility] Pager [phone]  If 7PM-7AM, please contact night-coverage [email or web address] Password [email or web address] 12/16/2013, 12:59 PM    LOS: 2 days              /Fecal impaction

## 2013-12-16 NOTE — Sedation Documentation (Signed)
Patient denies pain and is resting comfortably.  

## 2013-12-17 DIAGNOSIS — K5641 Fecal impaction: Secondary | ICD-10-CM

## 2013-12-17 DIAGNOSIS — K5900 Constipation, unspecified: Secondary | ICD-10-CM | POA: Diagnosis not present

## 2013-12-17 LAB — BASIC METABOLIC PANEL
Anion gap: 13 (ref 5–15)
BUN: 8 mg/dL (ref 6–23)
CO2: 24 meq/L (ref 19–32)
CREATININE: 0.46 mg/dL — AB (ref 0.50–1.10)
Calcium: 9 mg/dL (ref 8.4–10.5)
Chloride: 97 mEq/L (ref 96–112)
GFR calc Af Amer: >90 mL/min (ref >90)
GFR calc non Af Amer: 84 mL/min — ABNORMAL LOW (ref >90)
GLUCOSE: 110 mg/dL — AB (ref 70–99)
Potassium: 3.9 mEq/L (ref 3.7–5.3)
Sodium: 134 mEq/L — ABNORMAL LOW (ref 137–147)

## 2013-12-17 LAB — CBC
HEMATOCRIT: 35.8 % — AB (ref 36.0–46.0)
HEMOGLOBIN: 12.4 g/dL (ref 12.0–15.0)
MCH: 32.4 pg (ref 26.0–34.0)
MCHC: 34.6 g/dL (ref 30.0–36.0)
MCV: 93.5 fL (ref 78.0–100.0)
Platelets: 411 10*3/uL — ABNORMAL HIGH (ref 150–400)
RBC: 3.83 MIL/uL — ABNORMAL LOW (ref 3.87–5.11)
RDW: 12.8 % (ref 11.5–15.5)
WBC: 8.1 10*3/uL (ref 4.0–10.5)

## 2013-12-17 MED FILL — Hydromorphone HCl Inj 1 MG/ML: INTRAMUSCULAR | Qty: 1 | Status: AC

## 2013-12-17 NOTE — Progress Notes (Signed)
PROGRESS NOTE    Nancy Meyer XBJ:[id number] DOB: 1920/03/30 DOA: 12/14/2013 PCP: Pearson Grippe, MD  HPI/Brief narrative 80 year old female patient with history of HTN, HLD, osteoporosis, L4 kyphoplasty 2 weeks ago, discharge from SNF a week ago, presented to ED with complaints of worsening low back pain radiating to left leg, abdominal bloating and constipation. Family tried several home remedies for constipation without success. Appetite decreased. Patient mostly bedbound at this time. In the ED, CT abdomen showed large amount of stool throughout the colon compatible with severe constipation and new L1 compression fracture.   Assessment/Plan:  1. Severe constipation/fecal impaction: Secondary to immobility, poor oral intake and narcotic pain medications. No significant relief with mineral oil enema in ED & Milk of Molasses enema on 9/22. Added MiraLAX 17 g 3 times a day, senna 2 tabs twice a day and continued Colace. Advised patient and family that this is one of important side effects of narcotics and would like to minimize as tolerated. Trial of soap suds enema-did not get 9/23 due to late arrival from [facility] after KP-try today. If no relief, may have to consider GI consultation. 2. Acute on chronic back pain/new L1 compression fracture/s/p L4 kyphoplasty: s/p L1 KP on 9/23. 3. Hypokalemia: Aggressively replace and follow. Improved. 4. Hypertension: Mildly uncontrolled. Continue losartan. 5. Depression: Continue Wellbutrin 6. Non-severe (moderate) malnutrition in the context of chronic illness: Management per dietitian.   Code Status: Full Family Communication: Discussed with daughter 9/23. Disposition Plan: To be determined.   Consultants:  Interventional radiology  Procedures:  L1 KP 9/23  Antibiotics:  None   Subjective: Small BM last night. Asking for food this morning. Did not complain of back pain. Abdomen soft.  Objective: Filed Vitals:   12/15/13 2159 12/16/13  0425 12/16/13 2156 12/17/13 0519  BP: 142/70 131/70 161/79 150/69  Pulse: 104 94 104 79  Temp: 98.1 F (36.7 C) 98.7 F (37.1 C) 98.1 F (36.7 C) 98.1 F (36.7 C)  TempSrc: Oral Oral Oral Oral  Resp: Height:      Weight:  39.871 kg (87 lb 14.4 oz)  39.826 kg (87 lb 12.8 oz)  SpO2: 97% 99% 97% 97%    Intake/Output Summary (Last 24 hours) at 12/17/13 1105 Last data filed at 12/17/13 0513  Gross per 24 hour  Intake    120 ml  Output    300 ml  Net   -180 ml   Filed Weights   12/14/13 1504 12/16/13 0425 12/17/13 0519  Weight: 39.917 kg (88 lb) 39.871 kg (87 lb 14.4 oz) 39.826 kg (87 lb 12.8 oz)     Exam:  General exam: Elderly small built and frail female patient sitting comfortably at edge of bed. Respiratory system: Clear. No increased work of breathing. Cardiovascular system: S1 & S2 heard, RRR. No JVD, murmurs, gallops, clicks or pedal edema. Gastrointestinal system: Abdomen is nondistended, soft and nontender. Normal bowel sounds heard. Central nervous system: Alert and oriented. No focal neurological deficits. Extremities: Symmetric 5 x 5 power. MSS: No point tenderness or deformity. Has some tenderness in the lower lumbar paraspinal area   Data Reviewed: Basic Metabolic Panel:  Recent Labs Lab 12/14/13 0535 12/15/13 0418 12/16/13 0412 12/17/13 0433  NA 134* 135* 133* 134*  K 3.8 3.3* 3.0* 3.9  CL 95* 97 96 97  CO2 GLUCOSE 117* 117* 105* 110*  BUN CREATININE 0.52 0.48* 0.45* 0.46*  CALCIUM 9.6 8.6 8.5 9.0  MG  --   --  2.1  --    Liver Function Tests:  Recent Labs Lab 12/14/13 0535 12/15/13 0418  AST 21 15  ALT 15 13  ALKPHOS 98 89  BILITOT 0.4 0.3  PROT 6.9 6.1  ALBUMIN 3.4* 3.0*    Recent Labs Lab 12/14/13 0535  LIPASE 12   No results found for this basename: AMMONIA,  in the last 168 hours CBC:  Recent Labs Lab 12/14/13 0535 12/15/13 0418 12/16/13 0412 12/17/13 0433  WBC 7.8 6.0 6.3 8.1    NEUTROABS 5.5  --   --   --   HGB 13.3 12.0 11.2* 12.4  HCT 38.9 34.8* 32.9* 35.8*  MCV 95.8 94.1 95.1 93.5  PLT 317 355 325 411*   Cardiac Enzymes: No results found for this basename: CKTOTAL, CKMB, CKMBINDEX, TROPONINI,  in the last 168 hours BNP (last 3 results) No results found for this basename: PROBNP,  in the last 8760 hours CBG: No results found for this basename: GLUCAP,  in the last 168 hours  No results found for this or any previous visit (from the past 240 hour(s)).       Studies: Ir Kypho Vertebral Lumbar Augmentation  12/17/2013   CLINICAL DATA:  New onset of severe low back pain secondary to compression fracture at L1 .  EXAM: KYPHOPLASTY AT L1  PROCEDURE: Following a full explanation of the procedure along with the potential associated complications, an informed witnessed consent was obtained.  The patient was placed prone on the fluoroscopic table. The skin overlying the lumbar region was then prepped and draped in the usual sterile fashion. The right pedicle at L1 was then infiltrated with 0.25% bupivacaine followed by the advancement of an 10.5-gauge DFine needle through the right pedicle into the posterior one-third at L1.  Through the working needle, a DFine core biopsy device was then advanced distal to the tip of the needle. Using a 20 mL syringe, a core biopsy was obtained and sent for pathologic analysis.  Through the working needle, a DFine void creating device was then advanced and manipulated in different directions using biplane intermittent fluoroscopy. This was then removed. The needle was then advanced to the junction of the anterior and middle one-thirds at L1.  At this time methylmethacrylate mixture was reconstituted with tobramycin in the DFine mixing system. This was then loaded onto the DFine injector device. The injector device was then locked into position at the hub of the working needle.  Using biplane intermittent fluoroscopy, pulse delivery of the  methylmethacrylate mixture was then performed at L1 with excellent filling in the AP and lateral projections.  There was no extravasation seen into the adjacent disc spaces, or posteriorly into the spinal canal. No paraspinous venous contamination was seen. The working needle and injector device were then retrieved and removed. Hemostasis was achieved at the skin entry site.  The patient tolerated the procedure well. There were no acute complications.  Medications utilized: Versed 2 mg mg IV. Fentanyl 50 mcg micrograms IV. Dilaudid 1 mg IV.  Romazicon 0.25 mg IV. This was given because of the patient's decreasing oxygen saturations into the 80s momentarily. The patient responded promptly to the IV Romazicon  Total Moderate Sedation Time:  30 minutes.  IMPRESSION: 1. Status post fluoroscopic-guided needle placement for deep core bone biopsy at L1.  2. Status post vertebral body augmentation using DFine kyphoplasty at L1 as described without event.   Electronically  Signed   By: Julieanne Cotton M.D.   On: 12/16/2013 16:37        Scheduled Meds: . antiseptic oral rinse  7 mL Mouth Rinse BID  . buPROPion  150 mg Oral Daily  . cholecalciferol  1,000 Units Oral Daily  . docusate sodium  100 mg Oral BID  . feeding supplement (ENSURE COMPLETE)  237 mL Oral Q24H  . Influenza vac split quadrivalent PF  0.5 mL Intramuscular Tomorrow-1000  . lidocaine  1 patch Transdermal Q24H  . losartan  100 mg Oral Daily  . polyethylene glycol  17 g Oral TID  . senna  2 tablet Oral BID   Continuous Infusions:    Active Problems:   HTN (hypertension)   Constipation   Protein-calorie malnutrition, severe   Compression fracture of L1 lumbar vertebra   Hypokalemia    Time spent: 30 minutes.    Marcellus Scott, MD, FACP, FHM. [facility] Pager [phone]  If 7PM-7AM, please contact night-coverage [email or web address] Password [email or web address] 12/17/2013, 11:05 AM    LOS: 3 days              /Fecal  impaction

## 2013-12-17 NOTE — Progress Notes (Addendum)
Clinical Social Work Department CLINICAL SOCIAL WORK PLACEMENT NOTE 12/17/2013  Patient:  Nancy Meyer, Nancy Meyer  Account Number:  [id number] Admit date:  12/14/2013  Clinical Social Worker:  Jacelyn Grip  Date/time:  12/17/2013 03:45 PM  Clinical Social Work is seeking post-discharge placement for this patient at the following level of care:   SKILLED NURSING   (*CSW will update this form in Epic as items are completed)   12/17/2013  Patient/family provided with [facility] Department of Clinical Social Work's list of facilities offering this level of care within the geographic area requested by the patient (or if unable, by the patient's family).  12/17/2013  Patient/family informed of their freedom to choose among providers that offer the needed level of care, that participate in Medicare, Medicaid or managed care program needed by the patient, have an available bed and are willing to accept the patient.  12/17/2013  Patient/family informed of [facility]' ownership interest in [facility], as well as of the fact that they are under no obligation to receive care at this facility.  PASARR submitted to EDS on 12/17/2013 PASARR number received on 12/17/2013- existing  FL2 transmitted to all facilities in geographic area requested by pt/family on  12/17/2013 FL2 transmitted to all facilities within larger geographic area on   Patient informed that his/her managed care company has contracts with or will negotiate with  certain facilities, including the following:     Patient/family informed of bed offers received:  12/18/13 Patient chooses bed at [facility] Physician recommends and patient chooses bed at    Patient to be transferred to [facility] on  12/19/13 Patient to be transferred to facility by Son Patient and family notified of transfer on 12/19/13 Name of family member notified:  Son-Michael via phone  The following physician request were entered in  Epic:   Additional Comments:   Loletta Specter, MSW, LCSW Clinical Social Work [phone]

## 2013-12-17 NOTE — Evaluation (Signed)
Physical Therapy Evaluation Patient Details Name: Nancy Meyer MRN: [id number] DOB: Apr 30, 1920 Today's Date: 12/17/2013   History of Present Illness  79 year old female patient with history of HTN, HLD, osteoporosis, L4 kyphoplasty 2 weeks ago, recently at [facility], admitted for worsening low back pain radiating to left leg, abdominal bloating and constipation.  Pt s/p L1 kyphoplasty 12/16/13.  Clinical Impression  Pt currently with functional limitations due to the deficits listed below (see PT Problem List).  Pt will benefit from skilled PT to increase their independence and safety with mobility to allow discharge to the venue listed below.  Pt reports she was recently in rehab and her ultimate goal is to get back home.  Pt reports family involvement however none present upon evaluation.  Recommend 24/7 assist for pt as she reports increased pain, not eating, fatigues quickly.  If this can be arranged at home, would recommend HHPT however if assist not available, pt would benefit from ST-SNF.      Follow Up Recommendations SNF;Supervision/Assistance - 24 hour    Equipment Recommendations  Rolling walker with 5" wheels (youth)    Recommendations for Other Services       Precautions / Restrictions Precautions Precautions: Fall;Back Precaution Comments: pt unable to recall back precautions from previous surgery, reviewed and educated on back precautions      Mobility  Bed Mobility Overal bed mobility: Needs Assistance Bed Mobility: Rolling;Sidelying to Sit;Sit to Supine Rolling: Supervision Sidelying to sit: Supervision   Sit to supine: Min assist   General bed mobility comments: verbal cues for log roll technique, pt did not perform sidelying upon return to bed so assisted with LEs  Transfers Overall transfer level: Needs assistance Equipment used: Rolling walker (2 wheeled) Transfers: Sit to/from [employer] Sit to Stand: Min assist Stand pivot transfers:  Min assist       General transfer comment: verbal cues for safe technique  Ambulation/Gait Ambulation/Gait assistance: Min guard Ambulation Distance (Feet): 14 Feet Assistive device: Rolling walker (2 wheeled) Gait Pattern/deviations: Step-through pattern;Decreased stride length;Trunk flexed Gait velocity: decr   General Gait Details: pt reports weakness and fatigue as well as back pain contributing to limited distance, pt felt unable to correct trunk flexion posture due to pain  Stairs            Wheelchair Mobility    Modified Rankin (Stroke Patients Only)       Balance                                             Pertinent Vitals/Pain Pain Assessment: 0-10 Pain Location: pt did not rate however reports pain still present but much better since KP, low back pain Pain Descriptors / Indicators: Sore;Aching Pain Intervention(s): Monitored during session;Repositioned;RN gave pain meds during session    Home Living Family/patient expects to be discharged to:: Skilled nursing facility                      Prior Function Level of Independence: Independent         Comments: independent prior to previous back surgery     Hand Dominance        Extremity/Trunk Assessment               Lower Extremity Assessment: Generalized weakness      Cervical / Trunk Assessment: Kyphotic  Communication  Communication: No difficulties  Cognition Arousal/Alertness: Awake/alert Behavior During Therapy: WFL for tasks assessed/performed Overall Cognitive Status: Impaired/Different from baseline Area of Impairment: Memory     Memory: Decreased recall of precautions;Decreased short-term memory         General Comments: pt reports difficulty with memory and thoughts (states she has been on pain meds for 2 years)    General Comments      Exercises        Assessment/Plan    PT Assessment Patient needs continued PT services  PT  Diagnosis Difficulty walking;Acute pain   PT Problem List Decreased strength;Decreased activity tolerance;Decreased mobility;Pain;Decreased knowledge of use of DME;Decreased knowledge of precautions  PT Treatment Interventions DME instruction;Gait training;Functional mobility training;Patient/family education;Therapeutic activities;Therapeutic exercise   PT Goals (Current goals can be found in the Care Plan section) Acute Rehab PT Goals PT Goal Formulation: With patient Time For Goal Achievement: 12/24/13 Potential to Achieve Goals: Good    Frequency Min 3X/week   Barriers to discharge        Co-evaluation               End of Session   Activity Tolerance: Patient limited by fatigue;Patient limited by pain Patient left: in bed;with nursing/sitter in room;with call bell/phone within reach           Time: 1347-1407 PT Time Calculation (min): 20 min   Charges:   PT Evaluation $Initial PT Evaluation Tier I: 1 Procedure PT Treatments $Gait Training: 8-22 mins   PT G Codes:          Almond Fitzgibbon,KATHrine E 12/17/2013, 2:23 PM Zenovia Jarred, PT, DPT 12/17/2013 Pager: [phone]

## 2013-12-17 NOTE — Progress Notes (Signed)
Patient ID: Nancy Meyer, female   DOB: 1920-09-25, 80 y.o.   MRN: [id number]   Referring Physician(s): TRH  Subjective:  L 1 KP performed in IR with Dr Estanislado Pandy 9/23 Some better today Slept well Complains of abd pain form constipation  Allergies: Macrobid  Medications: Prior to Admission medications   Medication Sig Start Date End Date Taking? Authorizing Provider  ALPRAZolam (XANAX) 0.25 MG tablet Take 0.25 mg by mouth 2 (two) times daily as needed for anxiety.   Yes Historical Provider, MD  bisacodyl (DULCOLAX) 5 MG EC tablet Take 5 mg by mouth daily as needed for mild constipation.    Yes Historical Provider, MD  buPROPion (WELLBUTRIN SR) 150 MG 12 hr tablet Take 150 mg by mouth daily.    Yes Historical Provider, MD  calcium citrate-vitamin D 200-200 MG-UNIT TABS Take 1 tablet by mouth daily.    Yes Historical Provider, MD  cholecalciferol (VITAMIN D) 1000 UNITS tablet Take 1,000 Units by mouth every morning.    Yes Historical Provider, MD  ciprofloxacin (CIPRO) 500 MG tablet Take 500 mg by mouth 2 (two) times daily.   Yes Historical Provider, MD  docusate sodium 100 MG CAPS Take 100 mg by mouth 2 (two) times daily. 11/05/13  Yes Nishant Dhungel, MD  HYDROcodone-acetaminophen (NORCO/VICODIN) 5-325 MG per tablet Take 1 tablet by mouth every 4 (four) hours as needed for severe pain.    Yes Historical Provider, MD  lidocaine (LIDODERM) 5 % Place 1 patch onto the skin daily. Remove & Discard patch within 12 hours or as directed by MD 11/05/13  Yes Nishant Dhungel, MD  losartan (COZAAR) 100 MG tablet Take 100 mg by mouth daily.  07/12/11  Yes Historical Provider, MD  morphine (MSIR) 15 MG tablet Take 7.5 mg by mouth every 4 (four) hours as needed for severe pain.    Yes Historical Provider, MD  Multiple Vitamins-Minerals (PRESERVISION AREDS 2 PO) Take 2 tablets by mouth daily.   Yes Historical Provider, MD  magnesium hydroxide (PHILLIPS CHEWS) 311 MG CHEW chewable tablet Chew 622 mg  by mouth every 4 (four) hours as needed (constipation).    Historical Provider, MD  promethazine (PHENERGAN) 25 MG tablet Take 25 mg by mouth every 8 (eight) hours as needed for nausea or vomiting.    Historical Provider, MD    Review of Systems  Vital Signs: BP 150/69  Pulse 79  Temp(Src) 98.1 F (36.7 C) (Oral)  Resp 14  Ht '5\' 3"'  (1.6 m)  Wt 39.826 kg (87 lb 12.8 oz)  BMI 15.56 kg/m2  SpO2 97%  Physical Exam  Musculoskeletal:  FROM Site of L 1 KP is NT no bleeding afeb     Imaging: Ct Abdomen Pelvis W Contrast  12/14/2013   CLINICAL DATA:  Global abdominal pain with nausea. Evaluate for obstruction.  EXAM: CT ABDOMEN AND PELVIS WITH CONTRAST  TECHNIQUE: Multidetector CT imaging of the abdomen and pelvis was performed using the standard protocol following bolus administration of intravenous contrast.  CONTRAST:  26m OMNIPAQUE IOHEXOL 300 MG/ML  SOLN  COMPARISON:  CT 04/17/2006.  FINDINGS: Visualization of the lower thorax demonstrates normal heart size. Subpleural consolidative opacity within the right greater than left lower lobes most suggestive of atelectasis.  Liver is normal in size and contour. Gallbladder is distended. Common bile duct measures 8 mm. The spleen is unremarkable. Pancreas is unremarkable. The pancreatic duct is visualized. Normal adrenal glands.  Kidneys enhance symmetrically with contrast. No hydronephrosis. Sub cm low-attenuation  lesion within the peripheral aspect of the interpolar region of the right kidney too small to characterize. Additional cortically based sub cm lesion within the inferior pole of the right kidney too small to characterize. Too small to characterize low-attenuation lesion superior pole left kidney.  Scattered calcified atherosclerotic plaque involving and otherwise normal caliber abdominal aorta. No retroperitoneal lymphadenopathy. Urinary bladder is low-lying in the pelvis.  Marked sigmoid colonic diverticulosis. No definite CT evidence  for acute diverticulitis. The descending colon is relatively decompressed. There is a large amount of stool demonstrated from the cecum, ascending and transverse colons to the level of the splenic flexure. Oral contrast material is demonstrated within the distal small bowel. No evidence for small bowel obstruction.  No free fluid or free intraperitoneal air.  Status post kyphoplasty of L4 vertebral body compression fracture. The compression fracture appears somewhat progressed when compared to prior MRI new wedge compression deformity of the L1 vertebral body with approximately 50% height loss.  IMPRESSION: 1. Large amount of stool throughout the colon compatible with severe constipation. 2. Distended gallbladder without surrounding inflammatory change to suggest acute cholecystitis. 3. Interval progression of L4 vertebral body compression fracture. New compression fracture of the L1 vertebral body. 4. Subpleural ground-glass and consolidative opacities within the right greater than left lower lobes suggestive of atelectasis. Infection not excluded.   Electronically Signed   By: Lovey Newcomer M.D.   On: 12/14/2013 10:28   Ir Kypho Vertebral Lumbar Augmentation  12/17/2013   CLINICAL DATA:  New onset of severe low back pain secondary to compression fracture at L1 .  EXAM: KYPHOPLASTY AT L1  PROCEDURE: Following a full explanation of the procedure along with the potential associated complications, an informed witnessed consent was obtained.  The patient was placed prone on the fluoroscopic table. The skin overlying the lumbar region was then prepped and draped in the usual sterile fashion. The right pedicle at L1 was then infiltrated with 0.25% bupivacaine followed by the advancement of an 10.5-gauge DFine needle through the right pedicle into the posterior one-third at L1.  Through the working needle, a DFine core biopsy device was then advanced distal to the tip of the needle. Using a 20 mL syringe, a core biopsy  was obtained and sent for pathologic analysis.  Through the working needle, a DFine void creating device was then advanced and manipulated in different directions using biplane intermittent fluoroscopy. This was then removed. The needle was then advanced to the junction of the anterior and middle one-thirds at L1.  At this time methylmethacrylate mixture was reconstituted with tobramycin in the DFine mixing system. This was then loaded onto the DFine injector device. The injector device was then locked into position at the hub of the working needle.  Using biplane intermittent fluoroscopy, pulse delivery of the methylmethacrylate mixture was then performed at L1 with excellent filling in the AP and lateral projections.  There was no extravasation seen into the adjacent disc spaces, or posteriorly into the spinal canal. No paraspinous venous contamination was seen. The working needle and injector device were then retrieved and removed. Hemostasis was achieved at the skin entry site.  The patient tolerated the procedure well. There were no acute complications.  Medications utilized: Versed 2 mg mg IV. Fentanyl 50 mcg micrograms IV. Dilaudid 1 mg IV.  Romazicon 0.25 mg IV. This was given because of the patient's decreasing oxygen saturations into the 80s momentarily. The patient responded promptly to the IV Romazicon  Total Moderate Sedation Time:  30 minutes.  IMPRESSION: 1. Status post fluoroscopic-guided needle placement for deep core bone biopsy at L1.  2. Status post vertebral body augmentation using DFine kyphoplasty at L1 as described without event.   Electronically Signed   By: Luanne Bras M.D.   On: 12/16/2013 16:37    Labs: Results for orders placed during the hospital encounter of 12/14/13 (from the past 48 hour(s))  CBC     Status: Abnormal   Collection Time    12/16/13  4:12 AM      Result Value Ref Range   WBC 6.3  4.0 - 10.5 K/uL   RBC 3.46 (*) 3.87 - 5.11 MIL/uL   Hemoglobin 11.2 (*)  12.0 - 15.0 g/dL   HCT 32.9 (*) 36.0 - 46.0 %   MCV 95.1  78.0 - 100.0 fL   MCH 32.4  26.0 - 34.0 pg   MCHC 34.0  30.0 - 36.0 g/dL   RDW 13.0  11.5 - 15.5 %   Platelets 325  150 - 400 K/uL  BASIC METABOLIC PANEL     Status: Abnormal   Collection Time    12/16/13  4:12 AM      Result Value Ref Range   Sodium 133 (*) 137 - 147 mEq/L   Potassium 3.0 (*) 3.7 - 5.3 mEq/L   Chloride 96  96 - 112 mEq/L   CO2 25  19 - 32 mEq/L   Glucose, Bld 105 (*) 70 - 99 mg/dL   BUN 8  6 - 23 mg/dL   Creatinine, Ser 0.45 (*) 0.50 - 1.10 mg/dL   Calcium 8.5  8.4 - 10.5 mg/dL   GFR calc non Af Amer 85 (*) >90 mL/min   GFR calc Af Amer >90  >90 mL/min   Comment: (NOTE)     The eGFR has been calculated using the CKD EPI equation.     This calculation has not been validated in all clinical situations.     eGFR's persistently <90 mL/min signify possible Chronic Kidney     Disease.   Anion gap 12  5 - 15  MAGNESIUM     Status: None   Collection Time    12/16/13  4:12 AM      Result Value Ref Range   Magnesium 2.1  1.5 - 2.5 mg/dL  BASIC METABOLIC PANEL     Status: Abnormal   Collection Time    12/17/13  4:33 AM      Result Value Ref Range   Sodium 134 (*) 137 - 147 mEq/L   Potassium 3.9  3.7 - 5.3 mEq/L   Comment: DELTA CHECK NOTED     REPEATED TO VERIFY   Chloride 97  96 - 112 mEq/L   CO2 24  19 - 32 mEq/L   Glucose, Bld 110 (*) 70 - 99 mg/dL   BUN 8  6 - 23 mg/dL   Creatinine, Ser 0.46 (*) 0.50 - 1.10 mg/dL   Calcium 9.0  8.4 - 10.5 mg/dL   GFR calc non Af Amer 84 (*) >90 mL/min   GFR calc Af Amer >90  >90 mL/min   Comment: (NOTE)     The eGFR has been calculated using the CKD EPI equation.     This calculation has not been validated in all clinical situations.     eGFR's persistently <90 mL/min signify possible Chronic Kidney     Disease.   Anion gap 13  5 - 15  CBC  Status: Abnormal   Collection Time    12/17/13  4:33 AM      Result Value Ref Range   WBC 8.1  4.0 - 10.5 K/uL    RBC 3.83 (*) 3.87 - 5.11 MIL/uL   Hemoglobin 12.4  12.0 - 15.0 g/dL   HCT 35.8 (*) 36.0 - 46.0 %   MCV 93.5  78.0 - 100.0 fL   MCH 32.4  26.0 - 34.0 pg   MCHC 34.6  30.0 - 36.0 g/dL   RDW 12.8  11.5 - 15.5 %   Platelets 411 (*) 150 - 400 K/uL    Assessment and Plan:  L 1 KP performed 9/23 Pt some better    I spent a total of 20 minutes face to face in clinical consultation/evaluation, greater than 50% of which was counseling/coordinating care for L 1 Kyphoplasty  Signed: Buena Boehm A 12/17/2013, 10:00 AM

## 2013-12-17 NOTE — Progress Notes (Signed)
Clinical Social Work Department BRIEF PSYCHOSOCIAL ASSESSMENT 12/17/2013  Patient:  Nancy Meyer, Nancy Meyer     Account Number:  [id number]     Admit date:  12/14/2013  Clinical Social Worker:  Ulyess Blossom  Date/Time:  12/17/2013 03:30 PM  Referred by:  Physician  Date Referred:  12/17/2013 Referred for  SNF Placement   Other Referral:   Interview type:  Patient Other interview type:   and patient son, Nancy Meyer at bedside    PSYCHOSOCIAL DATA Living Status:  FAMILY Admitted from facility:   Level of care:   Primary support name:  Nancy Meyer Primary support relationship to patient:  CHILD, ADULT Degree of support available:   strong    CURRENT CONCERNS Current Concerns  Post-Acute Placement   Other Concerns:    SOCIAL WORK ASSESSMENT / PLAN CSW reviewed chart and noted that PT recommending SNF placement.    CSW met with pt and pt son, Nancy Meyer at bedside. CSW introduced self and explained role. CSW familiar with pt from previous admission. Pt son discussed that pt had short term stay at [facility], but came to hospital from home where pt family was providing 24 hour care along with privately paid sitters. CSW reviewed PT note and recommendations with pt son. Pt son discussed that pt and pt family had concerns about rehab stay and pt son feels that he needs to speak with his sister regarding disposition plan to make decision about ST SNF versus returning home with 24 hour care and HH. Pt and pt son are agreeable to CSW initiating SNF search to [facility] in order for pt and pt family to know options for facilities. CSW explained to pt and pt son that decision would need to be made tomorrow as pt insurance requires authorization for SNF placement and would need to be obtained tomorrow if pt and pt family opt for pt to transition to short term rehab when pt medically stable. Pt son expressed understanding. CSW provided support as pt son discussed that he and pt  sister want to make the best decision for pt and weighing the pros and cons of each option.    CSW completed FL2 and initiated SNF search to [facility].    CSW to follow up with pt and pt family re: SNF bed offers.    CSW initiated insurance authorization for potential SNF placement.    CSW to continue to follow to provide support and assist with pt discharge planning needs.   Assessment/plan status:  Psychosocial Support/Ongoing Assessment of Needs Other assessment/ plan:   discharge planning   Information/referral to community resources:   [facility] list    PATIENT'S/FAMILY'S RESPONSE TO PLAN OF CARE: Pt alert and oriented x 3. Pt family supportive and actively involved in pt care. Pt family weighing options for disposition plan to determine if they feel best plan will be for pt to go to ST SNF or return home with 24 hour care and [facility]. Pt son appreciative of visit and support surrounding disposition planning.    Nancy Meyer, MSW, [place] [facility] [phone]

## 2013-12-17 NOTE — Progress Notes (Signed)
CSW continuing to follow.  CSW followed up with pt at bedside and provided SNF bed offers available at this time.  CSW contacted pt son, [name] and discussed offers as well. Pt son plans to discuss with pt daughter regarding decision of SNF vs. Pt returning home with [facility] services.   CSW to follow up with pt and pt family re: decision for discharge plan.   CSW to continue to follow.  Loletta Specter, MSW, LCSW Clinical Social Work [phone]

## 2013-12-18 ENCOUNTER — Inpatient Hospital Stay (HOSPITAL_COMMUNITY): Payer: Medicare Other

## 2013-12-18 MED ORDER — HYDROCODONE-ACETAMINOPHEN 5-325 MG PO TABS
1.0000 | ORAL_TABLET | Freq: Once | ORAL | Status: AC
  Administered 2013-12-18: 1 via ORAL
  Filled 2013-12-18: qty 1

## 2013-12-18 NOTE — Progress Notes (Signed)
Clinical Social Work  CSW met with patient and son at bedside. CSW provided bed offers. Son reports he will go tour [facility] and review other options. Son will have decision by noon. CSW will continue to follow.  Sindy Messing, LCSW (Coverage for [employer])

## 2013-12-18 NOTE — Progress Notes (Signed)
Clinical Social Work  Patient and family chose Nancy Meyer for rehab at DC. Family has already completed paperwork and facility can accept when medically stable. MD reports no DC today but possibly tomorrow. SNF and insurance aware of DC plans and authorization has been provided. Weekend CSW to contact SNF at [phone] and ask for RN in [facility] and patient will admit to room 807. CSW to fax DC summary to [phone].  CSW will continue to follow and assist with DC planning.  Unk Lightning, LCSW (Coverage for [employer])

## 2013-12-18 NOTE — Care Management Note (Signed)
CARE MANAGEMENT NOTE 12/18/2013  Patient:  KIMANI, BEDOYA   Account Number:  [id number]  Date Initiated:  12/18/2013  Documentation initiated by:  Sandford Craze  Subjective/Objective Assessment:   80 yo admitted with Constipation     Action/Plan:   from alone with [facility]   Anticipated DC Date:  12/19/2013   Anticipated DC Plan:  SKILLED NURSING FACILITY  In-house referral  Clinical Social Worker      DC Planning Services  CM consult      Choice offered to / List presented to:             Status of service:  In process, will continue to follow Medicare Important Message given?  YES (If response is "NO", the following Medicare IM given date fields will be blank) Date Medicare IM given:  12/18/2013 Medicare IM given by:  Sandford Craze Date Additional Medicare IM given:   Additional Medicare IM given by:    Discharge Disposition:    Per UR Regulation:  Reviewed for med. necessity/level of care/duration of stay  If discussed at Long Length of Stay Meetings, dates discussed:    Comments:  Sandford Craze RN,BSN,NCM

## 2013-12-18 NOTE — Progress Notes (Signed)
Physical Therapy Treatment Patient Details Name: Nancy Meyer MRN: [id number] DOB: May 26, 1920 Today's Date: 12/18/2013    History of Present Illness 80 year old female patient with history of HTN, HLD, osteoporosis, L4 kyphoplasty 2 weeks ago, recently at [facility], admitted for worsening low back pain radiating to left leg, abdominal bloating and constipation.  Pt s/p L1 kyphoplasty 12/16/13.    PT Comments    Assisted pt OOB to amb limited distance in hallway.  Pt required increased time to amb and to position comfortably back into bed.  Pt plans to D/C to SNF for ST Rehab.  Stated son is looking at a few.  Follow Up Recommendations  SNF     Equipment Recommendations       Recommendations for Other Services       Precautions / Restrictions Precautions Precautions: Fall;Back Restrictions Weight Bearing Restrictions: No    Mobility  Bed Mobility Overal bed mobility: Needs Assistance Bed Mobility: Supine to Sit;Sit to Supine     Supine to sit: Min guard Sit to supine: Min guard   General bed mobility comments: verbal cues for log roll technique, pt did not perform sidelying upon return to bed so assisted with LEs  Transfers Overall transfer level: Needs assistance Equipment used: Rolling walker (2 wheeled) Transfers: Sit to/from Stand Sit to Stand: Min assist         General transfer comment: verbal cues for safe technique  Ambulation/Gait Ambulation/Gait assistance: Min guard;Min assist Ambulation Distance (Feet): 25 Feet Assistive device: Rolling walker (2 wheeled) Gait Pattern/deviations: Step-to pattern;Step-through pattern;Trunk flexed;Narrow base of support Gait velocity: decr   General Gait Details: poor posture nearly 90 degrees forward flex.  Pt states she is unable to stand erect/upright and has been this way for quite some time.     Stairs            Wheelchair Mobility    Modified Rankin (Stroke Patients Only)       Balance                                     Cognition                            Exercises      General Comments        Pertinent Vitals/Pain      Home Living                      Prior Function            PT Goals (current goals can now be found in the care plan section) Progress towards PT goals: Progressing toward goals    Frequency       PT Plan      Co-evaluation             End of Session Equipment Utilized During Treatment: Gait belt Activity Tolerance: Patient limited by fatigue Patient left: in bed;with nursing/sitter in room;with call bell/phone within reach     Time: 1010-1035 PT Time Calculation (min): 25 min  Charges:  $Gait Training: 8-22 mins $Therapeutic Activity: 8-22 mins                    G Codes:      Felecia Shelling  PTA [facility]  Acute  Rehab Pager      [phone]

## 2013-12-18 NOTE — Progress Notes (Signed)
PROGRESS NOTE    Nancy Meyer MWU:[id number] DOB: Feb 08, 1921 DOA: 12/14/2013 PCP: Pearson Grippe, MD  HPI/Brief narrative 80 year old female patient with history of HTN, HLD, osteoporosis, L4 kyphoplasty 2 weeks ago, discharge from SNF a week ago, presented to ED with complaints of worsening low back pain radiating to left leg, abdominal bloating and constipation. Family tried several home remedies for constipation without success. Appetite decreased. Patient mostly bedbound at this time. In the ED, CT abdomen showed large amount of stool throughout the colon compatible with severe constipation and new L1 compression fracture.   Assessment/Plan:  1. Severe constipation/fecal impaction: Secondary to immobility, poor oral intake and narcotic pain medications. No significant relief with mineral oil enema in ED & Milk of Molasses enema on 9/22. Added MiraLAX 17 g 3 times a day, senna 2 tabs twice a day and continued Colace. Advised patient and family that this is one of important side effects of narcotics and would like to minimize as tolerated. Had soapsuds enema on 9/24. Reported to good BMs since midnight last night. KUB shows improvement but still shows significant stool burden. Repeat soapsuds enema today, continue bowel regimen and followup KUB in a.m. 2. Acute on chronic back pain/new L1 compression fracture/s/p L4 kyphoplasty: s/p L1 KP on 9/23. Back pain improved. 3. Hypokalemia: Replaced. 4. Hypertension: Mildly uncontrolled. Continue losartan. 5. Depression: Continue Wellbutrin 6. Non-severe (moderate) malnutrition in the context of chronic illness: Management per dietitian.   Code Status: Full Family Communication: Discussed with daughter 9/23. Disposition Plan: DC to SNF possibly 9/26   Consultants:  Interventional radiology  Procedures:  L1 KP 9/23  Antibiotics:  None   Subjective: 2 large BMs since midnight last night. Denies abdominal pain. Back pain  improved.  Objective: Filed Vitals:   12/16/13 2156 12/17/13 0519 12/17/13 1400 12/18/13 0500  BP: 161/79 150/69 139/73 141/72  Pulse: 104 79 93 96  Temp: 98.1 F (36.7 C) 98.1 F (36.7 C) 98 F (36.7 C) 97.9 F (36.6 C)  TempSrc: Oral Oral Oral Oral  Resp: Height:      Weight:  39.826 kg (87 lb 12.8 oz)  39.418 kg (86 lb 14.4 oz)  SpO2: 97% 97% 97% 97%    Intake/Output Summary (Last 24 hours) at 12/18/13 1531 Last data filed at 12/18/13 0534  Gross per 24 hour  Intake    600 ml  Output   1150 ml  Net   -550 ml   Filed Weights   12/16/13 0425 12/17/13 0519 12/18/13 0500  Weight: 39.871 kg (87 lb 14.4 oz) 39.826 kg (87 lb 12.8 oz) 39.418 kg (86 lb 14.4 oz)     Exam:  General exam: Elderly small built and frail female patient lying comfortably in bed. Respiratory system: Clear. No increased work of breathing. Cardiovascular system: S1 & S2 heard, RRR. No JVD, murmurs, gallops, clicks or pedal edema. Gastrointestinal system: Abdomen is nondistended, soft and nontender. Normal bowel sounds heard. Central nervous system: Alert and oriented. No focal neurological deficits. Extremities: Symmetric 5 x 5 power.    Data Reviewed: Basic Metabolic Panel:  Recent Labs Lab 12/14/13 0535 12/15/13 0418 12/16/13 0412 12/17/13 0433  NA 134* 135* 133* 134*  K 3.8 3.3* 3.0* 3.9  CL 95* 97 96 97  CO2 GLUCOSE 117* 117* 105* 110*  BUN CREATININE 0.52 0.48* 0.45* 0.46*  CALCIUM 9.6 8.6 8.5 9.0  MG  --   --  2.1  --    Liver Function Tests:  Recent Labs Lab 12/14/13 0535 12/15/13 0418  AST 21 15  ALT 15 13  ALKPHOS 98 89  BILITOT 0.4 0.3  PROT 6.9 6.1  ALBUMIN 3.4* 3.0*    Recent Labs Lab 12/14/13 0535  LIPASE 12   No results found for this basename: AMMONIA,  in the last 168 hours CBC:  Recent Labs Lab 12/14/13 0535 12/15/13 0418 12/16/13 0412 12/17/13 0433  WBC 7.8 6.0 6.3 8.1  NEUTROABS 5.5  --   --   --   HGB  13.3 12.0 11.2* 12.4  HCT 38.9 34.8* 32.9* 35.8*  MCV 95.8 94.1 95.1 93.5  PLT 317 355 325 411*   Cardiac Enzymes: No results found for this basename: CKTOTAL, CKMB, CKMBINDEX, TROPONINI,  in the last 168 hours BNP (last 3 results) No results found for this basename: PROBNP,  in the last 8760 hours CBG: No results found for this basename: GLUCAP,  in the last 168 hours  No results found for this or any previous visit (from the past 240 hour(s)).       Studies: Dg Abd Portable 1v  12/18/2013   CLINICAL DATA:  Evaluate fecal impaction. Patient had a bowel movement this morning.  EXAM: PORTABLE ABDOMEN - 1 VIEW  COMPARISON:  CT, 12/14/2013  FINDINGS: There is still significant stool throughout the colon and in the rectum, but this appears decreased from the prior CT most evident in the sigmoid colon. There is no significant bowel distention on the current exam. No evidence of obstruction.  IMPRESSION: There has been a decrease in rectal and colonic stool since prior study, although the stool burden remains moderately increased. No bowel obstruction.   Electronically Signed   By: Amie Portland M.D.   On: 12/18/2013 08:00        Scheduled Meds: . antiseptic oral rinse  7 mL Mouth Rinse BID  . buPROPion  150 mg Oral Daily  . cholecalciferol  1,000 Units Oral Daily  . docusate sodium  100 mg Oral BID  . feeding supplement (ENSURE COMPLETE)  237 mL Oral Q24H  . lidocaine  1 patch Transdermal Q24H  . losartan  100 mg Oral Daily  . polyethylene glycol  17 g Oral TID  . senna  2 tablet Oral BID   Continuous Infusions:    Active Problems:   HTN (hypertension)   Constipation   Protein-calorie malnutrition, severe   Compression fracture of L1 lumbar vertebra   Hypokalemia    Time spent: 30 minutes.    Marcellus Scott, MD, FACP, FHM. [facility] Pager [phone]  If 7PM-7AM, please contact night-coverage [email or web address] Password [email or web address] 12/18/2013, 3:31 PM    LOS: 4 days               /Fecal impaction

## 2013-12-19 ENCOUNTER — Inpatient Hospital Stay (HOSPITAL_COMMUNITY): Payer: Medicare Other

## 2013-12-19 DIAGNOSIS — E43 Unspecified severe protein-calorie malnutrition: Secondary | ICD-10-CM

## 2013-12-19 LAB — BASIC METABOLIC PANEL
Anion gap: 12 (ref 5–15)
BUN: 9 mg/dL (ref 6–23)
CALCIUM: 8.7 mg/dL (ref 8.4–10.5)
CO2: 25 mEq/L (ref 19–32)
Chloride: 97 mEq/L (ref 96–112)
Creatinine, Ser: 0.45 mg/dL — ABNORMAL LOW (ref 0.50–1.10)
GFR calc Af Amer: >90 mL/min (ref >90)
GFR calc non Af Amer: 85 mL/min — ABNORMAL LOW (ref >90)
GLUCOSE: 118 mg/dL — AB (ref 70–99)
Potassium: 3.2 mEq/L — ABNORMAL LOW (ref 3.7–5.3)
Sodium: 134 mEq/L — ABNORMAL LOW (ref 137–147)

## 2013-12-19 MED ORDER — ACETAMINOPHEN 325 MG PO TABS: 650.0000 mg | ORAL_TABLET | Freq: Four times a day (QID) | ORAL | Status: DC | PRN

## 2013-12-19 MED ORDER — POLYETHYLENE GLYCOL 3350 17 G PO PACK: 17.0000 g | PACK | Freq: Three times a day (TID) | ORAL | Status: DC

## 2013-12-19 MED ORDER — HYDROCODONE-ACETAMINOPHEN 5-325 MG PO TABS: 1.0000 | ORAL_TABLET | ORAL | Status: DC | PRN

## 2013-12-19 MED ORDER — ENSURE COMPLETE PO LIQD: 237.0000 mL | ORAL | Status: DC

## 2013-12-19 MED ORDER — POTASSIUM CHLORIDE CRYS ER 20 MEQ PO TBCR: 20.0000 meq | EXTENDED_RELEASE_TABLET | Freq: Once | ORAL | Status: DC

## 2013-12-19 MED ORDER — LACTULOSE 10 GM/15ML PO SOLN: 20.0000 g | Freq: Two times a day (BID) | ORAL | Status: DC | PRN

## 2013-12-19 MED ORDER — ALPRAZOLAM 0.25 MG PO TABS: 0.2500 mg | ORAL_TABLET | Freq: Two times a day (BID) | ORAL | Status: DC | PRN

## 2013-12-19 MED ORDER — HYDROCODONE-ACETAMINOPHEN 5-325 MG PO TABS: 1.0000 | ORAL_TABLET | Freq: Four times a day (QID) | ORAL | Status: DC | PRN

## 2013-12-19 MED ORDER — MORPHINE SULFATE 15 MG PO TABS: 7.5000 mg | ORAL_TABLET | Freq: Four times a day (QID) | ORAL | Status: DC | PRN

## 2013-12-19 MED ORDER — HYDROCODONE-ACETAMINOPHEN 5-325 MG PO TABS
1.0000 | ORAL_TABLET | Freq: Once | ORAL | Status: AC
  Administered 2013-12-19: 1 via ORAL
  Filled 2013-12-19: qty 1

## 2013-12-19 MED ORDER — POTASSIUM CHLORIDE CRYS ER 20 MEQ PO TBCR
40.0000 meq | EXTENDED_RELEASE_TABLET | Freq: Once | ORAL | Status: AC
  Administered 2013-12-19: 40 meq via ORAL
  Filled 2013-12-19: qty 2

## 2013-12-19 MED ORDER — SENNA 8.6 MG PO TABS: 2.0000 | ORAL_TABLET | Freq: Two times a day (BID) | ORAL | Status: DC

## 2013-12-19 NOTE — Discharge Summary (Signed)
Physician Discharge Summary  Nancy Meyer:[id number] DOB: 01-Feb-1921 DOA: 12/14/2013  PCP: Pearson Grippe, MD  Admit date: 12/14/2013 Discharge date: 12/19/2013  Time spent: 35 minutes  Recommendations for Outpatient Follow-up:  1. Please repeat a BMP in 2-3 days, on day of discharge had a Potassium of 3.2 for which she was given Kdur 40 meq 2. Please monitor closely for constipation, was discharged on bowel regimen.    Discharge Diagnoses:  Active Problems:   HTN (hypertension)   Constipation   Protein-calorie malnutrition, severe   Compression fracture of L1 lumbar vertebra   Hypokalemia   Discharge Condition: Stable/Improved  Diet recommendation: Regular diet  Filed Weights   12/16/13 0425 12/17/13 0519 12/18/13 0500  Weight: 39.871 kg (87 lb 14.4 oz) 39.826 kg (87 lb 12.8 oz) 39.418 kg (86 lb 14.4 oz)    History of present illness:  80 year old female with past medical history of constipation, depression, hypertension who presented to [facility] ED 12/14/2013 with worsening Lower and mid abdominal pain and constipation for past few days prior to this admission. Pain is 4/-5/10 in intensity and non radiating. No diarrhea. No blood per rectum. No fevers or chills. No chest pain, palpitations or shortness of breath. No cough. No lightheadedness or loss of consciousness. No falls.  In ED, vitals are stable. No BM with mineral oil enema. Blood work essentially unremarkable. CT abdomen showed large amount of stool throughout the colon compatible with severe constipation.Pt admitted for constipation and related abdominal pain.  Hospital Course:   Patient is a pleasant 80 year old female with a past medical history of osteoporosis, vertebral fracture, status post L4 kyphoplasty, who was admitted to the medicine service on 12/14/2013 presenting with complaints of worsening lower back pain, abdominal pain associated with bloating and nausea. Patient reporting constipation at home. She was  initially workup with a CT scan of abdomen and pelvis showing a large amount of stool throughout the colon consistent with severe constipation. Also noted was a new compression fracture of L1 vertebral body.   1. Severe constipation/fecal impaction: Secondary to immobility, poor oral intake and narcotic pain medications. No significant relief with mineral oil enema in ED & Milk of Molasses enema on 9/22. Added MiraLAX 17 g 3 times a day, senna 2 tabs twice a day and continued Colace. Advised patient and family that this is one of important side effects of narcotics and would like to minimize as tolerated. Had soapsuds enema on 9/24. Reported to good BMs since midnight last night. KUB shows improvement but still shows significant stool burden. Repeat soapsuds enema, continue bowel regimen and followup KUB showing moderate stool burden however she reports having daily bowel movements. Patient clinically improved, tolerating by mouth intake, he was well to be discharged today. 2. Acute on chronic back pain/new L1 compression fracture/s/p L4 kyphoplasty: s/p L1 KP on 9/23. Back pain improved. 3. Hypokalemia: On 12/19/2013 labs showing a potassium of 3.2 for which she was given K. Dur 40 mEq. Will discharge on K. Dur 20 mEq daily, please follow up on BMP in 2-3 days 4. Hypertension: Mildly uncontrolled. Continue losartan. 5. Depression: Continue Wellbutrin 6. Non-severe (moderate) malnutrition in the context of chronic illness: Continue protein boost  Procedures:  L1 kyphoplasty on 12/16/2013  Consultations:  Interventional radiology  Discharge Exam: Filed Vitals:   12/19/13 0530  BP: 148/83  Pulse: 105  Temp: 97.4 F (36.3 C)  Resp: 16    General: No acute distress, sitting up in bed, tolerated  breakfast this morning, states feeling better Cardiovascular: Regular rate and rhythm normal S1-S2 no murmurs rubs or gallops Respiratory: Normal respiratory effort, lungs are clear to auscultation  bilaterally Abdomen: Soft nontender nondistended Extremity: No edema  Discharge Instructions You were cared for by a hospitalist during your hospital stay. If you have any questions about your discharge medications or the care you received while you were in the hospital after you are discharged, you can call the unit and asked to speak with the hospitalist on call if the hospitalist that took care of you is not available. Once you are discharged, your primary care physician will handle any further medical issues. Please note that NO REFILLS for any discharge medications will be authorized once you are discharged, as it is imperative that you return to your primary care physician (or establish a relationship with a primary care physician if you do not have one) for your aftercare needs so that they can reassess your need for medications and monitor your lab values.   Current Discharge Medication List    START taking these medications   Details  acetaminophen (TYLENOL) 325 MG tablet Take 2 tablets (650 mg total) by mouth every 6 (six) hours as needed for mild pain, moderate pain, fever or headache. Qty: 30 tablet, Refills: 0    feeding supplement, ENSURE COMPLETE, (ENSURE COMPLETE) LIQD Take 237 mLs by mouth daily. Qty: 20 Bottle, Refills: 1    lactulose (CHRONULAC) 10 GM/15ML solution Take 30 mLs (20 g total) by mouth 2 (two) times daily as needed for moderate constipation. Qty: 240 mL, Refills: 0    polyethylene glycol (MIRALAX / GLYCOLAX) packet Take 17 g by mouth 3 (three) times daily. Qty: 14 each, Refills: 0    potassium chloride SA (K-DUR,KLOR-CON) 20 MEQ tablet Take 1 tablet (20 mEq total) by mouth once. Qty: 2 tablet, Refills: 0    senna (SENOKOT) 8.6 MG TABS tablet Take 2 tablets (17.2 mg total) by mouth 2 (two) times daily. Qty: 120 each, Refills: 0      CONTINUE these medications which have CHANGED   Details  ALPRAZolam (XANAX) 0.25 MG tablet Take 1 tablet (0.25 mg total)  by mouth 2 (two) times daily as needed for anxiety. Qty: 20 tablet, Refills: 0    HYDROcodone-acetaminophen (NORCO/VICODIN) 5-325 MG per tablet Take 1 tablet by mouth every 4 (four) hours as needed for severe pain. Qty: 215 tablet, Refills: 0    morphine (MSIR) 15 MG tablet Take 0.5 tablets (7.5 mg total) by mouth every 6 (six) hours as needed for severe pain. Qty: 15 tablet, Refills: 0      CONTINUE these medications which have NOT CHANGED   Details  bisacodyl (DULCOLAX) 5 MG EC tablet Take 5 mg by mouth daily as needed for mild constipation.     buPROPion (WELLBUTRIN SR) 150 MG 12 hr tablet Take 150 mg by mouth daily.     calcium citrate-vitamin D 200-200 MG-UNIT TABS Take 1 tablet by mouth daily.     cholecalciferol (VITAMIN D) 1000 UNITS tablet Take 1,000 Units by mouth every morning.     docusate sodium 100 MG CAPS Take 100 mg by mouth 2 (two) times daily. Qty: 10 capsule, Refills: 0    lidocaine (LIDODERM) 5 % Place 1 patch onto the skin daily. Remove & Discard patch within 12 hours or as directed by MD Qty: 10 patch, Refills: 0    losartan (COZAAR) 100 MG tablet Take 100 mg by mouth daily.  Multiple Vitamins-Minerals (PRESERVISION AREDS 2 PO) Take 2 tablets by mouth daily.    magnesium hydroxide (PHILLIPS CHEWS) 311 MG CHEW chewable tablet Chew 622 mg by mouth every 4 (four) hours as needed (constipation).    promethazine (PHENERGAN) 25 MG tablet Take 25 mg by mouth every 8 (eight) hours as needed for nausea or vomiting.      STOP taking these medications     ciprofloxacin (CIPRO) 500 MG tablet        Allergies  Allergen Reactions  . Macrobid [Nitrofurantoin] Itching   Follow-up Information   Follow up with Pearson Grippe, MD In 1 week.   Specialty:  Internal Medicine   Contact information:   [address] [place] [address] [phone]        The results of significant diagnostics from this hospitalization (including imaging,  microbiology, ancillary and laboratory) are listed below for reference.    Significant Diagnostic Studies: Ct Abdomen Pelvis W Contrast  12/14/2013   CLINICAL DATA:  Global abdominal pain with nausea. Evaluate for obstruction.  EXAM: CT ABDOMEN AND PELVIS WITH CONTRAST  TECHNIQUE: Multidetector CT imaging of the abdomen and pelvis was performed using the standard protocol following bolus administration of intravenous contrast.  CONTRAST:  80mL OMNIPAQUE IOHEXOL 300 MG/ML  SOLN  COMPARISON:  CT 04/17/2006.  FINDINGS: Visualization of the lower thorax demonstrates normal heart size. Subpleural consolidative opacity within the right greater than left lower lobes most suggestive of atelectasis.  Liver is normal in size and contour. Gallbladder is distended. Common bile duct measures 8 mm. The spleen is unremarkable. Pancreas is unremarkable. The pancreatic duct is visualized. Normal adrenal glands.  Kidneys enhance symmetrically with contrast. No hydronephrosis. Sub cm low-attenuation lesion within the peripheral aspect of the interpolar region of the right kidney too small to characterize. Additional cortically based sub cm lesion within the inferior pole of the right kidney too small to characterize. Too small to characterize low-attenuation lesion superior pole left kidney.  Scattered calcified atherosclerotic plaque involving and otherwise normal caliber abdominal aorta. No retroperitoneal lymphadenopathy. Urinary bladder is low-lying in the pelvis.  Marked sigmoid colonic diverticulosis. No definite CT evidence for acute diverticulitis. The descending colon is relatively decompressed. There is a large amount of stool demonstrated from the cecum, ascending and transverse colons to the level of the splenic flexure. Oral contrast material is demonstrated within the distal small bowel. No evidence for small bowel obstruction.  No free fluid or free intraperitoneal air.  Status post kyphoplasty of L4 vertebral body  compression fracture. The compression fracture appears somewhat progressed when compared to prior MRI new wedge compression deformity of the L1 vertebral body with approximately 50% height loss.  IMPRESSION: 1. Large amount of stool throughout the colon compatible with severe constipation. 2. Distended gallbladder without surrounding inflammatory change to suggest acute cholecystitis. 3. Interval progression of L4 vertebral body compression fracture. New compression fracture of the L1 vertebral body. 4. Subpleural ground-glass and consolidative opacities within the right greater than left lower lobes suggestive of atelectasis. Infection not excluded.   Electronically Signed   By: Annia Belt M.D.   On: 12/14/2013 10:28   Dg Abd Portable 1v  12/19/2013   CLINICAL DATA:  Follow-up fecal impaction.  EXAM: PORTABLE ABDOMEN - 1 VIEW  COMPARISON:  12/18/2013  FINDINGS: There is at least moderate stool burden in the lower abdomen and pelvis. Gas filled loops of bowel but there is a nonobstructive bowel gas pattern. Again noted is scoliosis  in the lumbar spine with two levels of vertebral body augmentation.  IMPRESSION: Moderate stool burden in the abdomen and pelvis.   Electronically Signed   By: Richarda Overlie M.D.   On: 12/19/2013 06:56   Dg Abd Portable 1v  12/18/2013   CLINICAL DATA:  Evaluate fecal impaction. Patient had a bowel movement this morning.  EXAM: PORTABLE ABDOMEN - 1 VIEW  COMPARISON:  CT, 12/14/2013  FINDINGS: There is still significant stool throughout the colon and in the rectum, but this appears decreased from the prior CT most evident in the sigmoid colon. There is no significant bowel distention on the current exam. No evidence of obstruction.  IMPRESSION: There has been a decrease in rectal and colonic stool since prior study, although the stool burden remains moderately increased. No bowel obstruction.   Electronically Signed   By: Amie Portland M.D.   On: 12/18/2013 08:00   Ir Vertebroplasty  Lumobsacral Inj  11/27/2013   CLINICAL DATA:  Patient with severely painful compression fracture at L4.  EXAM: IR VERTEBROPLASTY LUMBOSACRAL INJ  MEDICATIONS: Versed 1.5 mg IV, Fentanyl 50 mcg IV.  ANESTHESIA/SEDATION: Total Moderate Sedation Time:  27 min.  FLUOROSCOPY TIME:  9 min 18 seconds.  PROCEDURE: Following a full explanation of the procedure along with the potential associated complications, a witnessed informed consent was obtained.  The patient was placed prone on the fluoroscopic table. Nasal oxygen was administered. Physiologic monitoring was performed throughout the duration of the procedure. The skin overlying the lumbar region was prepped and draped in the usual sterile fashion. The L4 vertebral body was identified and the right and left pedicle were infiltrated with 0.25% Bupivacaine. This was then followed by the advancement of a 13-gauge Cook needle through the pedicles into the anterior one-third at L4. A gentle contrast injection demonstrated a trabecular pattern of contrast enhancement with early opacification of adjacent veins.  At this time, methylmethacrylate mixture was reconstituted. Under biplane intermittent fluoroscopy, the methylmethacrylate was then injected into the L4 vertebral body with filling of the vertebral body L4.  No extravasation was noted into the disk spaces or posteriorly into the spinal canal. No epidural venous contamination was seen.  The needles were then removed. Hemostasis was achieved at the skin entry site.  There were no acute complications. Patient tolerated the procedure well. The patient was observed for 3 hours and discharged in good condition.  IMPRESSION: Status post vertebral body augmentation for painful compression fracture at L4 using vertebroplasty technique.   Electronically Signed   By: Julieanne Cotton M.D.   On: 11/27/2013 10:01   Ir Kypho Vertebral Lumbar Augmentation  12/17/2013   CLINICAL DATA:  New onset of severe low back pain secondary  to compression fracture at L1 .  EXAM: KYPHOPLASTY AT L1  PROCEDURE: Following a full explanation of the procedure along with the potential associated complications, an informed witnessed consent was obtained.  The patient was placed prone on the fluoroscopic table. The skin overlying the lumbar region was then prepped and draped in the usual sterile fashion. The right pedicle at L1 was then infiltrated with 0.25% bupivacaine followed by the advancement of an 10.5-gauge DFine needle through the right pedicle into the posterior one-third at L1.  Through the working needle, a DFine core biopsy device was then advanced distal to the tip of the needle. Using a 20 mL syringe, a core biopsy was obtained and sent for pathologic analysis.  Through the working needle, a DFine void creating device  was then advanced and manipulated in different directions using biplane intermittent fluoroscopy. This was then removed. The needle was then advanced to the junction of the anterior and middle one-thirds at L1.  At this time methylmethacrylate mixture was reconstituted with tobramycin in the DFine mixing system. This was then loaded onto the DFine injector device. The injector device was then locked into position at the hub of the working needle.  Using biplane intermittent fluoroscopy, pulse delivery of the methylmethacrylate mixture was then performed at L1 with excellent filling in the AP and lateral projections.  There was no extravasation seen into the adjacent disc spaces, or posteriorly into the spinal canal. No paraspinous venous contamination was seen. The working needle and injector device were then retrieved and removed. Hemostasis was achieved at the skin entry site.  The patient tolerated the procedure well. There were no acute complications.  Medications utilized: Versed 2 mg mg IV. Fentanyl 50 mcg micrograms IV. Dilaudid 1 mg IV.  Romazicon 0.25 mg IV. This was given because of the patient's decreasing oxygen  saturations into the 80s momentarily. The patient responded promptly to the IV Romazicon  Total Moderate Sedation Time:  30 minutes.  IMPRESSION: 1. Status post fluoroscopic-guided needle placement for deep core bone biopsy at L1.  2. Status post vertebral body augmentation using DFine kyphoplasty at L1 as described without event.   Electronically Signed   By: Julieanne Cotton M.D.   On: 12/16/2013 16:37   Ir Radiologist Eval & Mgmt  11/20/2013   EXAM: NEW PATIENT OFFICE VISIT  CHIEF COMPLAINT: Severe low back pain secondary to compression fracture at L4.  Current Pain Level: 1-10  HISTORY OF PRESENT ILLNESS: The patient is a 80 year old lady who presents for evaluation of severe low back pain secondary to compression fracture at L4. She is accompanied by her nurse and also her daughter.  The patient reports having had back problems for 2-3 years. These have entailed prominent low back pain and stiffness associated with intermittent left-sided radicular pain. The patient claims to have had at least 2 epidural injections with no relief.  Approximally 2-3 weeks ago the patient reports the onset of different severe agonizing pain in her lower back and tailbone which she reports to be 10/10 almost constantly.  The patient reports some relief by lying down. Since that time the patient has been on numerous pain medications. At the present time the patient is on Norco 5/325, Ultracet and on morphine for comfortable relief.  However, this has been accompanied by severe constipation.  Prior to her present deterioration the patient had been ambulatory independently, driving a car and also taking care of herself.  Since this recent episode the patient is bed bound.  There has been no history of trauma or fall.  She denies any symptoms of UTI such as dysuria, frequency of micturition or hematuria. She reports no autonomic dysfunction of her bowel or bladder.  The remainder of her review of systems is essentially negative  for pathologic symptomatology.  Past Medical History: Arthritis, depression. High blood pressure. Previous history of appendectomy.  Medications: Losartan. Wellbutrin. Ocuvite. Vitamin-D. Pain meds as described.  Allergies:  Ativan causes confusion.  Social History: Patient lives by herself. All her three siblings have passed away. Two children living. Patient denies any use of alcohol, cigarettes or illicit chemicals.  Family History: Cancer of the breast.  Stroke.  REVIEW OF SYSTEMS: As above.  PHYSICAL EXAMINATION: Brief examination reveals the patient to be in bed because  of severe pain. She is in obvious distress caused by the pain.  However, she is awake, alert and oriented to time, place, space. Affect appropriate, otherwise. Comprehension and responses are appropriate.  No gross lateralizing neurological features from a neurological or sensory standpoint. Station and gait not tested. Patient points to the lower lumbar region as the site of her pain.  ASSESSMENT AND PLAN: The patient's recent MRI scan of the lumbar spine was reviewed with the patient and her family.  This reveals significant scoliosis with secondary degenerative changes of the facet joints throughout associated with ligamentum flavum hypertrophy with severe spinal canal stenosis at L5-S1 and L4-L5. Associated disc herniation left posterolaterally at L4-5 with probable contact with exiting L5 nerve root.  Also seen are signal changes with compression deformity at L4 suggestive of acute to subacute compression fracture. The option of vertebral body augmentation at L4 for pain relief, and to prevent further collapse was discussed in detail with the patient and family. The reasons, risks, benefits and alternatives were all reviewed. The patient and her family would like to proceed with vertebral body augmentation at L4. This most likely will be a vertebroplasty. Questions were answered to their satisfaction.  The procedure will be scheduled at  the earliest possible.   Electronically Signed   By: Julieanne Cotton M.D.   On: 11/19/2013 15:25    Microbiology: No results found for this or any previous visit (from the past 240 hour(s)).   Labs: Basic Metabolic Panel:  Recent Labs Lab 12/14/13 0535 12/15/13 0418 12/16/13 0412 12/17/13 0433 12/19/13 0433  NA 134* 135* 133* 134* 134*  K 3.8 3.3* 3.0* 3.9 3.2*  CL 95* 97 96 97 97  CO2 GLUCOSE 117* 117* 105* 110* 118*  BUN CREATININE 0.52 0.48* 0.45* 0.46* 0.45*  CALCIUM 9.6 8.6 8.5 9.0 8.7  MG  --   --  2.1  --   --    Liver Function Tests:  Recent Labs Lab 12/14/13 0535 12/15/13 0418  AST 21 15  ALT 15 13  ALKPHOS 98 89  BILITOT 0.4 0.3  PROT 6.9 6.1  ALBUMIN 3.4* 3.0*    Recent Labs Lab 12/14/13 0535  LIPASE 12   No results found for this basename: AMMONIA,  in the last 168 hours CBC:  Recent Labs Lab 12/14/13 0535 12/15/13 0418 12/16/13 0412 12/17/13 0433  WBC 7.8 6.0 6.3 8.1  NEUTROABS 5.5  --   --   --   HGB 13.3 12.0 11.2* 12.4  HCT 38.9 34.8* 32.9* 35.8*  MCV 95.8 94.1 95.1 93.5  PLT 317 355 325 411*   Cardiac Enzymes: No results found for this basename: CKTOTAL, CKMB, CKMBINDEX, TROPONINI,  in the last 168 hours BNP: BNP (last 3 results) No results found for this basename: PROBNP,  in the last 8760 hours CBG: No results found for this basename: GLUCAP,  in the last 168 hours     Signed:  Jeralyn Bennett  [facility] 12/19/2013, 9:19 AM

## 2013-12-19 NOTE — Progress Notes (Signed)
Clinical Social Work  CSW faxed DC summary to [facility] who is agreeable to accept today. RN to call report to SNF. CSW prepared DC packet with FL2, DC summary and hard scripts included. CSW informed patient and son of DC plans. Son reports he will transport patient to SNF. RN aware of DC plans. CSW is signing off but available if needed.  Unk Lightning, LCSW (Weekend Coverage)

## 2014-01-04 ENCOUNTER — Emergency Department (HOSPITAL_COMMUNITY): Payer: Medicare Other

## 2014-01-04 ENCOUNTER — Encounter (HOSPITAL_COMMUNITY): Payer: Self-pay | Admitting: Emergency Medicine

## 2014-01-04 ENCOUNTER — Ambulatory Visit (HOSPITAL_COMMUNITY)
Admission: RE | Admit: 2014-01-04 | Discharge: 2014-01-04 | Disposition: A | Discharge status: Home / Self Care | Payer: Medicare Other | Source: Ambulatory Visit | Attending: Interventional Radiology | Admitting: Interventional Radiology

## 2014-01-04 ENCOUNTER — Emergency Department (HOSPITAL_COMMUNITY)
Admission: EM | Admit: 2014-01-04 | Discharge: 2014-01-05 | Disposition: A | Discharge status: Home / Self Care | Payer: Medicare Other | Attending: Emergency Medicine | Admitting: Emergency Medicine

## 2014-01-04 DIAGNOSIS — Z9889 Other specified postprocedural states: Secondary | ICD-10-CM | POA: Insufficient documentation

## 2014-01-04 DIAGNOSIS — E785 Hyperlipidemia, unspecified: Secondary | ICD-10-CM | POA: Insufficient documentation

## 2014-01-04 DIAGNOSIS — I10 Essential (primary) hypertension: Secondary | ICD-10-CM | POA: Diagnosis not present

## 2014-01-04 DIAGNOSIS — M1990 Unspecified osteoarthritis, unspecified site: Secondary | ICD-10-CM | POA: Diagnosis not present

## 2014-01-04 DIAGNOSIS — Z79899 Other long term (current) drug therapy: Secondary | ICD-10-CM | POA: Insufficient documentation

## 2014-01-04 DIAGNOSIS — M549 Dorsalgia, unspecified: Secondary | ICD-10-CM | POA: Diagnosis present

## 2014-01-04 DIAGNOSIS — M810 Age-related osteoporosis without current pathological fracture: Secondary | ICD-10-CM | POA: Diagnosis not present

## 2014-01-04 DIAGNOSIS — Z8669 Personal history of other diseases of the nervous system and sense organs: Secondary | ICD-10-CM | POA: Diagnosis not present

## 2014-01-04 DIAGNOSIS — M4854XA Collapsed vertebra, not elsewhere classified, thoracic region, initial encounter for fracture: Secondary | ICD-10-CM | POA: Insufficient documentation

## 2014-01-04 DIAGNOSIS — IMO0002 Reserved for concepts with insufficient information to code with codable children: Secondary | ICD-10-CM

## 2014-01-04 LAB — URINALYSIS, ROUTINE W REFLEX MICROSCOPIC
Bilirubin Urine: NEGATIVE
Glucose, UA: NEGATIVE mg/dL
Hgb urine dipstick: NEGATIVE
KETONES UR: 15 mg/dL — AB
LEUKOCYTES UA: NEGATIVE
NITRITE: NEGATIVE
PH: 7 (ref 5.0–8.0)
PROTEIN: NEGATIVE mg/dL
Specific Gravity, Urine: 1.016 (ref 1.005–1.030)
UROBILINOGEN UA: 1 mg/dL (ref 0.0–1.0)

## 2014-01-04 LAB — URINE MICROSCOPIC-ADD ON

## 2014-01-04 MED ORDER — OXYCODONE HCL 5 MG PO TABS
5.0000 mg | ORAL_TABLET | Freq: Once | ORAL | Status: AC
  Administered 2014-01-04: 5 mg via ORAL
  Filled 2014-01-04: qty 1

## 2014-01-04 MED ORDER — MORPHINE SULFATE 2 MG/ML IJ SOLN: 2.0000 mg | Freq: Once | INTRAMUSCULAR | Status: DC

## 2014-01-04 MED ORDER — DICLOFENAC SODIUM 50 MG PO TBEC
50.0000 mg | DELAYED_RELEASE_TABLET | Freq: Once | ORAL | Status: AC
  Administered 2014-01-04: 50 mg via ORAL
  Filled 2014-01-04: qty 1

## 2014-01-04 MED ORDER — ALPRAZOLAM 0.25 MG PO TABS
0.5000 mg | ORAL_TABLET | ORAL | Status: AC
  Administered 2014-01-04: 0.5 mg via ORAL
  Filled 2014-01-04: qty 2

## 2014-01-04 NOTE — Discharge Instructions (Signed)
Back, Compression Fracture °A compression fracture happens when a force is put upon the length of your spine. Slipping and falling on your bottom are examples of such a force. When this happens, sometimes the force is great enough to compress the building blocks (vertebral bodies) of your spine. Although this causes a lot of pain, this can usually be treated at home, unless your caregiver feels hospitalization is needed for pain control. °Your backbone (spinal column) is made up of 24 main vertebral bodies in addition to the sacrum and coccyx (see illustration). These are held together by tough fibrous tissues (ligaments) and by support of your muscles. Nerve roots pass through the openings between the vertebrae. A sudden wrenching move, injury, or a fall may cause a compression fracture of one of the vertebral bodies. This may result in back pain or spread of pain into the belly (abdomen), the buttocks, and down the leg into the foot. Pain may also be created by muscle spasm alone. °Large studies have been undertaken to determine the best possible course of action to help your back following injury and also to prevent future problems. The recommendations are as follows. °FOLLOWING A COMPRESSION FRACTURE: °Do the following only if advised by your caregiver.  °· If a back brace has been suggested or provided, wear it as directed. °· Do not stop wearing the back brace unless instructed by your caregiver. °· When allowed to return to regular activities, avoid a sedentary lifestyle. Actively exercise. Sporadic weekend binges of tennis, racquetball, or waterskiing may actually aggravate or create problems, especially if you are not in condition for that activity. °· Avoid sports requiring sudden body movements until you are in condition for them. Swimming and walking are safer activities. °· Maintain good posture. °· Avoid obesity. °· If not already done, you should have a DEXA scan. Based on the results, be treated for  osteoporosis. °FOLLOWING ACUTE (SUDDEN) INJURY: °· Only take over-the-counter or prescription medicines for pain, discomfort, or fever as directed by your caregiver. °· Use bed rest for only the most extreme acute episode. Prolonged bed rest may aggravate your condition. Ice used for acute conditions is effective. Use a large plastic bag filled with ice. Wrap it in a towel. This also provides excellent pain relief. This may be continuous. Or use it for 30 minutes every 2 hours during acute phase, then as needed. Heat for 30 minutes prior to activities is helpful. °· As soon as the acute phase (the time when your back is too painful for you to do normal activities) is over, it is important to resume normal activities and work hardening programs. Back injuries can cause potentially marked changes in lifestyle. So it is important to attack these problems aggressively. °· See your caregiver for continued problems. He or she can help or refer you for appropriate exercises, physical therapy, and work hardening if needed. °· If you are given narcotic medications for your condition, for the next 24 hours do not: °¨ Drive. °¨ Operate machinery or power tools. °¨ Sign legal documents. °· Do not drink alcohol, or take sleeping pills or other medications that may interfere with treatment. °If your caregiver has given you a follow-up appointment, it is very important to keep that appointment. Not keeping the appointment could result in a chronic or permanent injury, pain, and disability. If there is any problem keeping the appointment, you must call back to this facility for assistance.  °SEEK IMMEDIATE MEDICAL CARE IF: °· You develop numbness,   tingling, weakness, or problems with the use of your arms or legs. °· You develop severe back pain not relieved with medications. °· You have changes in bowel or bladder control. °· You have increasing pain in any areas of the body. °Document Released: 03/12/2005 Document Revised:  07/27/2013 Document Reviewed: 10/15/2007 °ExitCare® Patient Information ©2015 [employer]. This information is not intended to replace advice given to you by your health care provider. Make sure you discuss any questions you have with your health care provider. ° °

## 2014-01-04 NOTE — ED Notes (Signed)
Family concerned about patient receiving narcotics due to patient having problems with constipation from them in the recent past, will speak with MD regarding other pain control options. Pt currently resting in bed without complaint. Rates pain 3/10.

## 2014-01-04 NOTE — ED Notes (Signed)
MD at bedside. 

## 2014-01-04 NOTE — ED Notes (Signed)
Pt c/o lower back pain; pt with hx of same with compression fx; pt with possible UTI sx

## 2014-01-04 NOTE — ED Notes (Signed)
Please call son in law when pt returns to room [phone].

## 2014-01-04 NOTE — ED Notes (Signed)
Bladder scanned patient due to frequent urination, 550ml noted, MD Littie DeedsGentry notified and orders received.

## 2014-01-04 NOTE — ED Notes (Signed)
Patient transported to MRI 

## 2014-01-04 NOTE — ED Notes (Signed)
Pt assisted to bedside commode, then back to bed after she was finished.

## 2014-01-04 NOTE — ED Provider Notes (Signed)
CSN: [id number]     Arrival date & time 01/04/14  1342 History   First MD Initiated Contact with Patient 01/04/14 1602     Chief Complaint  Patient presents with  . Back Pain     (Consider location/radiation/quality/duration/timing/severity/associated sxs/prior Treatment) HPI Comments: 80 year old female with past medical history of hypertension and osteoporosis as well as multiple compression fractures who is currently in rehabilitation facility for previous compression fractures comes in with chief complaint of back pain. Patient has been admitted twice in the past few months for her compression fractures. She has undergone a L4 kyphoplasty for compression fracture as well. She states that her back pain flared up today while in the car. He states that it hurts throughout her lumbar spine bilaterally. She denies any numbness or weakness or incontinence. She initially complained of some abdominal pain but after having a bowel movement in emergency department her abdominal pain subsided. Denies any dysuria. Denies fevers. No trauma or falls. She is currently in rehabilitation facility and takes oxycodone as needed for pain.   Past Medical History  Diagnosis Date  . Hypertension   . Osteoporosis   . Macular degeneration   . Arthritis   . Hyperlipidemia    Past Surgical History  Procedure Laterality Date  . Appendectomy    . Eye surgery    . Back surgery     Family History  Problem Relation Age of Onset  . Cancer Daughter     breast   History  Substance Use Topics  . Smoking status: Never Smoker   . Smokeless tobacco: Never Used  . Alcohol Use: No   OB History   Grav Para Term Preterm Abortions TAB SAB Ect Mult Living   3 3 3             Review of Systems  Constitutional: Negative for fever, activity change, appetite change and fatigue.  HENT: Negative for congestion and rhinorrhea.   Eyes: Negative for discharge, redness and itching.  Respiratory: Negative for shortness  of breath and wheezing.   Cardiovascular: Negative for chest pain.  Gastrointestinal: Negative for nausea, vomiting, abdominal pain and diarrhea.  Genitourinary: Negative for dysuria and hematuria.  Musculoskeletal: Positive for back pain.  Skin: Negative for rash and wound.  Neurological: Negative for syncope.      Allergies  Macrobid  Home Medications   Prior to Admission medications   Medication Sig Start Date End Date Taking? Authorizing Provider  acetaminophen (TYLENOL) 325 MG tablet Take 2 tablets (650 mg total) by mouth every 6 (six) hours as needed for mild pain, moderate pain, fever or headache. 12/19/13   Jeralyn BennettEzequiel Zamora, MD  ALPRAZolam Prudy Feeler(XANAX) 0.25 MG tablet Take 1 tablet (0.25 mg total) by mouth 2 (two) times daily as needed for anxiety. 12/19/13   Jeralyn BennettEzequiel Zamora, MD  bisacodyl (DULCOLAX) 5 MG EC tablet Take 5 mg by mouth daily as needed for mild constipation.     Historical Provider, MD  buPROPion (WELLBUTRIN SR) 150 MG 12 hr tablet Take 150 mg by mouth daily.     Historical Provider, MD  calcium citrate-vitamin D 200-200 MG-UNIT TABS Take 1 tablet by mouth daily.     Historical Provider, MD  cholecalciferol (VITAMIN D) 1000 UNITS tablet Take 1,000 Units by mouth every morning.     Historical Provider, MD  docusate sodium 100 MG CAPS Take 100 mg by mouth 2 (two) times daily. 11/05/13   Nishant Dhungel, MD  feeding supplement, ENSURE COMPLETE, (ENSURE COMPLETE) LIQD  Take 237 mLs by mouth daily. 12/19/13   Jeralyn Bennett, MD  HYDROcodone-acetaminophen (NORCO/VICODIN) 5-325 MG per tablet Take 1 tablet by mouth every 4 (four) hours as needed for severe pain. 12/19/13   Jeralyn Bennett, MD  lactulose (CHRONULAC) 10 GM/15ML solution Take 30 mLs (20 g total) by mouth 2 (two) times daily as needed for moderate constipation. 12/19/13   Jeralyn Bennett, MD  lidocaine (LIDODERM) 5 % Place 1 patch onto the skin daily. Remove & Discard patch within 12 hours or as directed by MD 11/05/13    Eddie North, MD  losartan (COZAAR) 100 MG tablet Take 100 mg by mouth daily.  07/12/11   Historical Provider, MD  magnesium hydroxide (PHILLIPS CHEWS) 311 MG CHEW chewable tablet Chew 622 mg by mouth every 4 (four) hours as needed (constipation).    Historical Provider, MD  morphine (MSIR) 15 MG tablet Take 0.5 tablets (7.5 mg total) by mouth every 6 (six) hours as needed for severe pain. 12/19/13   Jeralyn Bennett, MD  Multiple Vitamins-Minerals (PRESERVISION AREDS 2 PO) Take 2 tablets by mouth daily.    Historical Provider, MD  polyethylene glycol (MIRALAX / GLYCOLAX) packet Take 17 g by mouth 3 (three) times daily. 12/19/13   Jeralyn Bennett, MD  potassium chloride SA (K-DUR,KLOR-CON) 20 MEQ tablet Take 1 tablet (20 mEq total) by mouth once. 12/19/13 12/21/13  Jeralyn Bennett, MD  promethazine (PHENERGAN) 25 MG tablet Take 25 mg by mouth every 8 (eight) hours as needed for nausea or vomiting.    Historical Provider, MD  senna (SENOKOT) 8.6 MG TABS tablet Take 2 tablets (17.2 mg total) by mouth 2 (two) times daily. 12/19/13   Jeralyn Bennett, MD   BP 145/68  Pulse 84  Temp(Src) 97.6 F (36.4 C) (Oral)  Resp 18  SpO2 95% Physical Exam  Constitutional: She is oriented to person, place, and time. She appears well-developed and well-nourished. No distress.  HENT:  Head: Normocephalic and atraumatic.  Mouth/Throat: Oropharynx is clear and moist. No oropharyngeal exudate.  Eyes: Conjunctivae and EOM are normal. Pupils are equal, round, and reactive to light. Right eye exhibits no discharge. Left eye exhibits no discharge. No scleral icterus.  Neck: Normal range of motion. Neck supple.  Cardiovascular: Normal rate, regular rhythm and normal heart sounds.   No murmur heard. Pulmonary/Chest: Effort normal and breath sounds normal. No respiratory distress. She has no wheezes. She has no rales.  Abdominal: Soft. She exhibits no distension and no mass. There is no tenderness.  Musculoskeletal:  No  midline spinous process tenderness to  Palpation Bilateral thoracolumbar paraspinal tenderness to palpation   Neurological: She is alert and oriented to person, place, and time. She exhibits normal muscle tone. Coordination normal.  5/5 strength in all extremities Intact sensation in all extremities 2+ DTRs patella and brachial radialis bilaterally  Skin: Skin is warm. No rash noted. She is not diaphoretic.    ED Course  Procedures (including critical care time) Labs Review Labs Reviewed  URINALYSIS, ROUTINE W REFLEX MICROSCOPIC - Abnormal; Notable for the following:    APPearance TURBID (*)    Ketones, ur 15 (*)    All other components within normal limits  URINE MICROSCOPIC-ADD ON - Abnormal; Notable for the following:    Bacteria, UA MANY (*)    All other components within normal limits  URINE CULTURE    Imaging Review Dg Thoracic Spine 2 View  01/04/2014   CLINICAL DATA:  Low back pain.  History of compression  fracture.  EXAM: THORACIC SPINE - 2 VIEW  COMPARISON:  CT abdomen and pelvis 12/14/2013.  FINDINGS: Since prior examination, the patient has undergone vertebral augmentation for a compression fracture of L1. There is a new mild biconcave compression fracture deformity of T12 with vertebral body height loss estimated at 20%. No other fracture is identified.  IMPRESSION: Findings consistent with acute or subacute biconcave compression fracture of T12 with vertebral body height loss estimated at 20%.  Status post vertebral augmentation for fracture of L1.   Electronically Signed   By: Drusilla Kanner M.D.   On: 01/04/2014 18:01   Dg Lumbar Spine 2-3 Views  01/04/2014   CLINICAL DATA:  Low back pain.  No known injury.  EXAM: LUMBAR SPINE - 2-3 VIEW  COMPARISON:  11/02/2013; CT abdomen pelvis- 12/18/2013; lumbar spine MRI - 11/03/2013  FINDINGS: There are 5 non rib-bearing lumbar type vertebral bodies.  Re- demonstrated moderate scoliotic curvature of the mid thoracic spine,  convex to the left, centered about the L3-L4 articulation.  The patient has undergone interval vertebroplasty/ kyphoplasty of the L2 vertebral body.  Stable vertebroplasty/ kyphoplasty of the L4 vertebral body.  Interval development of mild (approximately 25%) compression deformity involving the inferior endplate of the T12 vertebral body.  Unchanged mild (approximately 25%) compression deformity involving the inferior endplate of the L3 vertebral body. Remaining vertebral body heights appear preserved given obliquity.  There is grossly unchanged moderate to severe multilevel lumbar spine DDD, worse at L3-L4 and to a lesser extent, L2-L3 and L4-L5 with disc space height loss, endplate irregularity and sclerosis.  Atherosclerotic plaque within the abdominal aorta. Regional soft tissues appear otherwise normal.  IMPRESSION: 1. Interval development of a mild (approximately 25%) compression deformity involving the inferior endplate of the T12 vertebral body. Correlation for point tenderness at this location is recommended. Further evaluation could be performed with lumbar spine MRI as clinically indicated. 2. Interval vertebroplasty/kyphoplasty of the L1 vertebral body. 3. Unchanged vertebroplasty/kyphoplasty of the L4 vertebral body. 4. Unchanged focal scoliotic curvature of the lumbar spine, convex to the left, centered about the L3-L4 articulation. 5. Grossly unchanged moderate severe multilevel lumbar spine DDD, worse at L3-L4.   Electronically Signed   By: Simonne Come M.D.   On: 01/04/2014 18:03   Mr Thoracic Spine Wo Contrast  01/04/2014   CLINICAL DATA:  Lower back pain, possibly urinary tract infection. History of compression fracture. Dysuria, with bladder catheter in place. Assess compression fractures.  EXAM: MRI THORACIC AND LUMBAR SPINE WITHOUT CONTRAST  TECHNIQUE: Multiplanar and multiecho pulse sequences of the thoracic and lumbar spine were obtained without intravenous contrast.  COMPARISON:   Thoracic spine radiographs January 04, 2014, MRI of the lumbar spine November 03, 2013, CT of the abdomen pelvis November 27, 2013  FINDINGS: MR THORACIC SPINE FINDINGS  Mild T12 compression fracture with less than 20% height loss. Low T1, bright STIR signal within the vertebral body consistent with edema with inferior anterior endplate fracture. No retropulsed bony fragments. The remaining thoracic vertebral bodies are intact. No malalignment. Maintenance thoracic kyphosis. Intervertebral discs demonstrate overall normal morphology. Decreased T2 signal in all discs consistent with mild desiccation. Generalized bright T1 bone marrow signal likely represents osteopenia.  Thoracic spinal cord is normal in morphology and signal characteristics to the level the conus medullaris which terminates at L1. No syrinx.  Partially imaged at least 24 x 15 mm T2 bright lesion a RIGHT thyroid gland would be better characterized on dedicated thyroid sonogram on a  nonemergent basis. 8 mm RIGHT T2 perineural cysts, with additional scattered subcentimeter perineural cysts to the level of T10.  Small central disc protrusion at T5-6 contacts the ventral spinal cord without canal stenosis. No neural foraminal narrowing at any thoracic level.  MR LUMBAR SPINE FINDINGS  Moderate to severe L1 burst fracture, with central low T1 and low T2 signal consistent with cement augmentation. 4 mm retropulsed bony fragments. Approximately 50% central vertebral body height loss, relatively unchanged. Moderate to severe chronic L4 insufficiency fracture. Moderate L3 chronic insufficiency fracture. Intact L2 and L5 vertebral bodies. Lumbar lordosis maintained. No malalignment.  Generalized bright T1 bone marrow signal suggest osteopenia.  Severe L2-3 disc degeneration with central bright STIR signal likely reflecting edema. Moderate L3-4, L4-5 moderate to severe L5-S1 disc height loss, with moderate multilevel disc degeneration. Severe L3-4, L4-5 acute on  chronic discogenic endplate changes. Moderate to severe chronic L2-3 discogenic endplate changes, mild at L5-S1.  Conus medullaris terminates at T12-L1 and appears normal in morphology and signal characteristics. Central displacement of the cauda equina due to canal stenosis, unchanged. Moderate to severe paraspinal muscle atrophy.  Level by level evaluation:  T12-L1: Retropulsed bony fragments L1, mild facet arthropathy. Mild to moderate canal stenosis with at least moderate RIGHT neural foraminal narrowing.  L2-3: Small broad-based disc osteophyte complex, mild facet arthropathy and ligamentum flavum redundancy. No canal stenosis though, partial effacement of the RIGHT lateral recess may affect the traversing RIGHT L3 nerve. Mild LEFT greater than RIGHT neural foraminal narrowing.  L3-4: Moderate to large broad-based disc osteophyte complex asymmetric to the RIGHT may affect the exited RIGHT L3 nerve. Moderate to severe facet arthropathy and ligamentum flavum redundancy. Moderate to severe canal stenosis including LEFT lateral recess effacement which likely affect the traversing LEFT L4 nerve. Severe RIGHT, moderate LEFT neural foraminal narrowing.  L4-5: Large broad-based disc osteophyte complex asymmetric to the LEFT may affect the exiting LEFT L4 nerve. Severe LEFT greater than RIGHT facet arthropathy and ligamentum flavum redundancy. Severe canal stenosis with LEFT lateral recess effacement, likely affecting the traversing LEFT L5 nerve. Mild to moderate RIGHT, severe LEFT neural foraminal narrowing.  L5-S1: Small broad-based disc osteophyte complex asymmetric to the LEFT encroaches upon the exited LEFT L5 nerve. Severe LEFT and moderate RIGHT facet arthropathy ligamentum flavum redundancy. No canal stenosis. Partial effacement LEFT lateral recess may affect the traversing LEFT S1 nerve. Severe LEFT neural foraminal narrowing.  IMPRESSION: MR THORACIC SPINE IMPRESSION  Acute mild T12 compression/insufficiency  fracture. No additional thoracic spine fractures. No malalignment.  No neurocompressive changes of the thoracic spine.  MR LUMBAR SPINE IMPRESSION  No acute fracture nor malalignment. L1 and L4 fractures, status post cement augmentation. Stable moderate L3 compression fracture. No progressed height loss of these fractures from prior imaging.  Lumbar spondylosis. Severe canal stenosis at L4-5, moderate to severe at L3-4.  Neural foraminal narrowing at all lumbar levels: Severe on the RIGHT at L3-4, severe on the LEFT at L4-5 and L5-S1.   Electronically Signed   By: Awilda Metro   On: 01/04/2014 22:36   Mr Lumbar Spine Wo Contrast  01/04/2014   CLINICAL DATA:  Lower back pain, possibly urinary tract infection. History of compression fracture. Dysuria, with bladder catheter in place. Assess compression fractures.  EXAM: MRI THORACIC AND LUMBAR SPINE WITHOUT CONTRAST  TECHNIQUE: Multiplanar and multiecho pulse sequences of the thoracic and lumbar spine were obtained without intravenous contrast.  COMPARISON:  Thoracic spine radiographs January 04, 2014, MRI of the lumbar  spine November 03, 2013, CT of the abdomen pelvis November 27, 2013  FINDINGS: MR THORACIC SPINE FINDINGS  Mild T12 compression fracture with less than 20% height loss. Low T1, bright STIR signal within the vertebral body consistent with edema with inferior anterior endplate fracture. No retropulsed bony fragments. The remaining thoracic vertebral bodies are intact. No malalignment. Maintenance thoracic kyphosis. Intervertebral discs demonstrate overall normal morphology. Decreased T2 signal in all discs consistent with mild desiccation. Generalized bright T1 bone marrow signal likely represents osteopenia.  Thoracic spinal cord is normal in morphology and signal characteristics to the level the conus medullaris which terminates at L1. No syrinx.  Partially imaged at least 24 x 15 mm T2 bright lesion a RIGHT thyroid gland would be better  characterized on dedicated thyroid sonogram on a nonemergent basis. 8 mm RIGHT T2 perineural cysts, with additional scattered subcentimeter perineural cysts to the level of T10.  Small central disc protrusion at T5-6 contacts the ventral spinal cord without canal stenosis. No neural foraminal narrowing at any thoracic level.  MR LUMBAR SPINE FINDINGS  Moderate to severe L1 burst fracture, with central low T1 and low T2 signal consistent with cement augmentation. 4 mm retropulsed bony fragments. Approximately 50% central vertebral body height loss, relatively unchanged. Moderate to severe chronic L4 insufficiency fracture. Moderate L3 chronic insufficiency fracture. Intact L2 and L5 vertebral bodies. Lumbar lordosis maintained. No malalignment.  Generalized bright T1 bone marrow signal suggest osteopenia.  Severe L2-3 disc degeneration with central bright STIR signal likely reflecting edema. Moderate L3-4, L4-5 moderate to severe L5-S1 disc height loss, with moderate multilevel disc degeneration. Severe L3-4, L4-5 acute on chronic discogenic endplate changes. Moderate to severe chronic L2-3 discogenic endplate changes, mild at L5-S1.  Conus medullaris terminates at T12-L1 and appears normal in morphology and signal characteristics. Central displacement of the cauda equina due to canal stenosis, unchanged. Moderate to severe paraspinal muscle atrophy.  Level by level evaluation:  T12-L1: Retropulsed bony fragments L1, mild facet arthropathy. Mild to moderate canal stenosis with at least moderate RIGHT neural foraminal narrowing.  L2-3: Small broad-based disc osteophyte complex, mild facet arthropathy and ligamentum flavum redundancy. No canal stenosis though, partial effacement of the RIGHT lateral recess may affect the traversing RIGHT L3 nerve. Mild LEFT greater than RIGHT neural foraminal narrowing.  L3-4: Moderate to large broad-based disc osteophyte complex asymmetric to the RIGHT may affect the exited RIGHT L3  nerve. Moderate to severe facet arthropathy and ligamentum flavum redundancy. Moderate to severe canal stenosis including LEFT lateral recess effacement which likely affect the traversing LEFT L4 nerve. Severe RIGHT, moderate LEFT neural foraminal narrowing.  L4-5: Large broad-based disc osteophyte complex asymmetric to the LEFT may affect the exiting LEFT L4 nerve. Severe LEFT greater than RIGHT facet arthropathy and ligamentum flavum redundancy. Severe canal stenosis with LEFT lateral recess effacement, likely affecting the traversing LEFT L5 nerve. Mild to moderate RIGHT, severe LEFT neural foraminal narrowing.  L5-S1: Small broad-based disc osteophyte complex asymmetric to the LEFT encroaches upon the exited LEFT L5 nerve. Severe LEFT and moderate RIGHT facet arthropathy ligamentum flavum redundancy. No canal stenosis. Partial effacement LEFT lateral recess may affect the traversing LEFT S1 nerve. Severe LEFT neural foraminal narrowing.  IMPRESSION: MR THORACIC SPINE IMPRESSION  Acute mild T12 compression/insufficiency fracture. No additional thoracic spine fractures. No malalignment.  No neurocompressive changes of the thoracic spine.  MR LUMBAR SPINE IMPRESSION  No acute fracture nor malalignment. L1 and L4 fractures, status post cement augmentation. Stable moderate L3  compression fracture. No progressed height loss of these fractures from prior imaging.  Lumbar spondylosis. Severe canal stenosis at L4-5, moderate to severe at L3-4.  Neural foraminal narrowing at all lumbar levels: Severe on the RIGHT at L3-4, severe on the LEFT at L4-5 and L5-S1.   Electronically Signed   By: Awilda Metroourtnay  Bloomer   On: 01/04/2014 22:36     EKG Interpretation None      MDM   MDM: 80 year old female comes in with concerns for back pain. History of compression fractures. Now has new pain without trauma. No fevers. She is not immunocompromised. Unlikely epidural abscess or other infection. Plain films show concern for new  compression fracture at T12. Patient intermittent urinating, concern for retention. MRI of T. and L. spine ordered. MRI shows no spinal cord compromise but showed new T12 compression fracture with 20% height loss. Patient given Voltaren for symptoms here. She refused narcotics. After MRI patient feels better. Foley was inserted before MRI patient requesting it be removed. MRI shows no evidence of acute emergency, specifically no evidence of infection or spinal cord compromise. Discussed with spinal orthopedist on call who recommends pain control. Patient has her own spine surgeon she follows up with. She states she will call him tomorrow to discuss further management of these fractures. Patient given oxycodone prior to discharge. patient has adequate pain medicine at home per her family. Discharged.   Final diagnoses:  Compression fracture    Discharge Medication List as of 01/05/2014 12:06 AM     [facility] [address] [id number] [place] [address] [phone]  As needed  Oneal GroutSanjeev K Deveshwar, MD [address] [place] [address] [phone]  Schedule an appointment as soon as possible for a visit   Discharged  Pilar Jarvisoug Verl Kitson, MD 01/05/14 0104

## 2014-01-05 NOTE — ED Provider Notes (Signed)
I saw and evaluated the patient, reviewed the resident's note and I agree with the findings and plan.   EKG Interpretation None       Briefly, pt is a 80 y.o. female presenting with back pain from riding in a car.  No trauma.  No fevers or systemic symptoms.  I performed an examination on the patient including cardiac, pulmonary, and gi systems which were unremarkable.  Pt has diffuse spinal tenderness.  Initially without history or exam findings of cord pathology, however pt had a prevoid residual of 500cc.  Obtained MRI without acute cord pathology.  Spoke with [facility] who advised pain control and fu with pt's primary spinal surgeon.     Mirian MoMatthew Gentry, MD 01/05/14 0110

## 2014-01-07 ENCOUNTER — Telehealth (HOSPITAL_COMMUNITY): Payer: Self-pay | Admitting: Interventional Radiology

## 2014-01-07 NOTE — Telephone Encounter (Signed)
Spoke to pt's son-in-law about

## 2014-01-08 LAB — URINE CULTURE: Colony Count: >=100000

## 2014-01-10 ENCOUNTER — Telehealth (HOSPITAL_BASED_OUTPATIENT_CLINIC_OR_DEPARTMENT_OTHER): Payer: Self-pay | Admitting: Emergency Medicine

## 2014-01-10 NOTE — Telephone Encounter (Signed)
[facility] [phone]

## 2014-01-14 ENCOUNTER — Other Ambulatory Visit (HOSPITAL_COMMUNITY): Payer: Self-pay | Admitting: Interventional Radiology

## 2014-01-14 DIAGNOSIS — IMO0002 Reserved for concepts with insufficient information to code with codable children: Secondary | ICD-10-CM

## 2014-01-14 DIAGNOSIS — M546 Pain in thoracic spine: Secondary | ICD-10-CM

## 2014-01-15 ENCOUNTER — Other Ambulatory Visit: Payer: Self-pay | Admitting: Radiology

## 2014-01-15 ENCOUNTER — Other Ambulatory Visit (HOSPITAL_COMMUNITY): Payer: Self-pay | Admitting: Radiology

## 2014-01-15 ENCOUNTER — Encounter (HOSPITAL_COMMUNITY): Payer: Self-pay

## 2014-01-15 ENCOUNTER — Ambulatory Visit (HOSPITAL_COMMUNITY)
Admission: RE | Admit: 2014-01-15 | Discharge: 2014-01-15 | Disposition: A | Discharge status: Home / Self Care | Payer: Medicare Other | Source: Ambulatory Visit | Attending: Radiology | Admitting: Radiology

## 2014-01-15 DIAGNOSIS — J302 Other seasonal allergic rhinitis: Secondary | ICD-10-CM

## 2014-01-15 DIAGNOSIS — S32040S Wedge compression fracture of fourth lumbar vertebra, sequela: Secondary | ICD-10-CM

## 2014-01-15 DIAGNOSIS — I10 Essential (primary) hypertension: Secondary | ICD-10-CM

## 2014-01-15 DIAGNOSIS — R109 Unspecified abdominal pain: Secondary | ICD-10-CM

## 2014-01-15 DIAGNOSIS — F329 Major depressive disorder, single episode, unspecified: Secondary | ICD-10-CM

## 2014-01-15 DIAGNOSIS — M810 Age-related osteoporosis without current pathological fracture: Secondary | ICD-10-CM

## 2014-01-15 DIAGNOSIS — E876 Hypokalemia: Secondary | ICD-10-CM

## 2014-01-15 DIAGNOSIS — M549 Dorsalgia, unspecified: Secondary | ICD-10-CM | POA: Diagnosis present

## 2014-01-15 DIAGNOSIS — E871 Hypo-osmolality and hyponatremia: Secondary | ICD-10-CM

## 2014-01-15 DIAGNOSIS — E43 Unspecified severe protein-calorie malnutrition: Secondary | ICD-10-CM

## 2014-01-15 DIAGNOSIS — E785 Hyperlipidemia, unspecified: Secondary | ICD-10-CM

## 2014-01-15 DIAGNOSIS — Z79891 Long term (current) use of opiate analgesic: Secondary | ICD-10-CM | POA: Insufficient documentation

## 2014-01-15 DIAGNOSIS — K5900 Constipation, unspecified: Secondary | ICD-10-CM

## 2014-01-15 DIAGNOSIS — S32010A Wedge compression fracture of first lumbar vertebra, initial encounter for closed fracture: Secondary | ICD-10-CM

## 2014-01-15 DIAGNOSIS — Z79899 Other long term (current) drug therapy: Secondary | ICD-10-CM | POA: Diagnosis not present

## 2014-01-15 DIAGNOSIS — Z87311 Personal history of (healed) other pathological fracture: Secondary | ICD-10-CM | POA: Insufficient documentation

## 2014-01-15 DIAGNOSIS — M4854XA Collapsed vertebra, not elsewhere classified, thoracic region, initial encounter for fracture: Secondary | ICD-10-CM | POA: Diagnosis not present

## 2014-01-15 DIAGNOSIS — F32A Depression, unspecified: Secondary | ICD-10-CM

## 2014-01-15 DIAGNOSIS — S32040D Wedge compression fracture of fourth lumbar vertebra, subsequent encounter for fracture with routine healing: Secondary | ICD-10-CM

## 2014-01-15 DIAGNOSIS — D649 Anemia, unspecified: Secondary | ICD-10-CM

## 2014-01-15 DIAGNOSIS — R110 Nausea: Secondary | ICD-10-CM

## 2014-01-15 DIAGNOSIS — R739 Hyperglycemia, unspecified: Secondary | ICD-10-CM

## 2014-01-15 LAB — APTT: APTT: 28 s (ref 24–37)

## 2014-01-15 LAB — BASIC METABOLIC PANEL
Anion gap: 11 (ref 5–15)
BUN: 12 mg/dL (ref 6–23)
CO2: 28 meq/L (ref 19–32)
CREATININE: 0.52 mg/dL (ref 0.50–1.10)
Calcium: 8.9 mg/dL (ref 8.4–10.5)
Chloride: 100 mEq/L (ref 96–112)
GFR calc Af Amer: >90 mL/min (ref >90)
GFR, EST NON AFRICAN AMERICAN: 81 mL/min — AB (ref >90)
GLUCOSE: 112 mg/dL — AB (ref 70–99)
Potassium: 3.2 mEq/L — ABNORMAL LOW (ref 3.7–5.3)
SODIUM: 139 meq/L (ref 137–147)

## 2014-01-15 LAB — CBC
HEMATOCRIT: 33.9 % — AB (ref 36.0–46.0)
Hemoglobin: 11.5 g/dL — ABNORMAL LOW (ref 12.0–15.0)
MCH: 32.4 pg (ref 26.0–34.0)
MCHC: 33.9 g/dL (ref 30.0–36.0)
MCV: 95.5 fL (ref 78.0–100.0)
PLATELETS: 303 10*3/uL (ref 150–400)
RBC: 3.55 MIL/uL — ABNORMAL LOW (ref 3.87–5.11)
RDW: 13.4 % (ref 11.5–15.5)
WBC: 6 10*3/uL (ref 4.0–10.5)

## 2014-01-15 LAB — PROTIME-INR
INR: 0.97 (ref 0.00–1.49)
Prothrombin Time: 13 seconds (ref 11.6–15.2)

## 2014-01-15 MED ORDER — FLUMAZENIL 0.5 MG/5ML IV SOLN
INTRAVENOUS | Status: AC
  Filled 2014-01-15: qty 5

## 2014-01-15 MED ORDER — FENTANYL CITRATE 0.05 MG/ML IJ SOLN
INTRAMUSCULAR | Status: AC
  Filled 2014-01-15: qty 2

## 2014-01-15 MED ORDER — MIDAZOLAM HCL 2 MG/2ML IJ SOLN
INTRAMUSCULAR | Status: AC | PRN
  Administered 2014-01-15: 0.5 mg via INTRAVENOUS

## 2014-01-15 MED ORDER — NALOXONE HCL 0.4 MG/ML IJ SOLN
INTRAMUSCULAR | Status: AC
  Filled 2014-01-15: qty 1

## 2014-01-15 MED ORDER — CEFAZOLIN SODIUM-DEXTROSE 2-3 GM-% IV SOLR
2.0000 g | Freq: Once | INTRAVENOUS | Status: AC
  Administered 2014-01-15: 2 g via INTRAVENOUS

## 2014-01-15 MED ORDER — IOHEXOL 300 MG/ML  SOLN
50.0000 mL | Freq: Once | INTRAMUSCULAR | Status: AC | PRN
  Administered 2014-01-15: 5 mL

## 2014-01-15 MED ORDER — CEFAZOLIN SODIUM-DEXTROSE 2-3 GM-% IV SOLR
INTRAVENOUS | Status: AC
  Administered 2014-01-15: 2 g via INTRAVENOUS
  Filled 2014-01-15: qty 50

## 2014-01-15 MED ORDER — MIDAZOLAM HCL 2 MG/2ML IJ SOLN
INTRAMUSCULAR | Status: AC
  Filled 2014-01-15: qty 2

## 2014-01-15 MED ORDER — SODIUM CHLORIDE 0.9 % IV SOLN
Freq: Once | INTRAVENOUS | Status: AC
  Administered 2014-01-15: 12:00:00 via INTRAVENOUS

## 2014-01-15 MED ORDER — BUPIVACAINE HCL (PF) 0.25 % IJ SOLN
INTRAMUSCULAR | Status: AC
  Filled 2014-01-15: qty 30

## 2014-01-15 MED ORDER — TOBRAMYCIN SULFATE 1.2 G IJ SOLR
INTRAMUSCULAR | Status: AC
  Filled 2014-01-15: qty 1.2

## 2014-01-15 MED ORDER — FENTANYL CITRATE 0.05 MG/ML IJ SOLN
INTRAMUSCULAR | Status: AC | PRN
  Administered 2014-01-15 (×2): 12.5 ug via INTRAVENOUS
  Administered 2014-01-15: 25 ug via INTRAVENOUS

## 2014-01-15 MED ORDER — SODIUM CHLORIDE 0.9 % IV SOLN: INTRAVENOUS | Status: AC

## 2014-01-15 NOTE — Discharge Instructions (Signed)
No stooping ,bending,or lifting more than 5 lbs for 2 weeks. Use walker to ambulate. 3RTC in 2 weeks,  KYPHOPLASTY/VERTEBROPLASTY DISCHARGE INSTRUCTIONS  Medications: (check all that apply)     Resume all home medications as before procedure.       Resume your (aspirin/Plavix/Coumadin) on                   Continue your pain medications as prescribed as needed.  Over the next 3-5 days, decrease your pain medication as tolerated.  Over the counter medications (i.e. Tylenol, ibuprofen, and aleve) may be substituted once severe/moderate pain symptoms have subsided.   Wound Care:   Bandages may be removed the day following your procedure.  You may get your incision wet once bandages are removed.  Bandaids may be used to cover the incisions until scab formation.  Topical ointments are optional.    If you develop a fever greater than 101 degrees, have increased skin redness at the incision sites or pus-like oozing from incisions occurring within 1 week of the procedure, contact radiology at [phone] or [phone].    Ice pack to back for 15-20 minutes 2-3 time per day for first 2-3 days post procedure.  The ice will expedite muscle healing and help with the pain from the incisions.   Activity:   Bedrest today with limited activity for 24 hours post procedure.    No driving for 48 hours.    Increase your activity as tolerated after bedrest (with assistance if necessary).    Refrain from any strenuous activity or heavy lifting (greater than 10 lbs.).   Follow up:   Contact radiology at [phone] or [phone] if any questions/concerns.    A physician assistant from radiology will contact you in approximately 1 week.    If a biopsy was performed at the time of your procedure, your referring physician should receive the results in usually 2-3 days.

## 2014-01-15 NOTE — Procedures (Signed)
S /P T 12 VP 

## 2014-01-15 NOTE — Progress Notes (Signed)
Discharge instructions given to pt and family members verbally and in writing.  Pt's son and POA is transporting pt back to the nursing home facility.  Mr Rosaura CarpenterBlackwood given written instructions for himself and for facility.  [facility].

## 2014-01-15 NOTE — H&P (Signed)
Chief Complaint: Continued back pain  Referring Physician(s): Dr Corliss Skains  History of Present Illness: Nancy Meyer is a 80 y.o. female  Lumbar 4 kyphoplasty 11/27/2013 Lumbar 1 kyphoplasty 12/17/2012 Back pain did well for few days Worsened and new MRI performed 10/12 revealed fx Thoracic 12 Now scheduled for vertbroplasty vs kyphoplasty at T 12 level  Past Medical History  Diagnosis Date  . Hypertension   . Osteoporosis   . Macular degeneration   . Arthritis   . Hyperlipidemia     Past Surgical History  Procedure Laterality Date  . Appendectomy    . Eye surgery    . Back surgery      Allergies: Macrobid  Medications: Prior to Admission medications   Medication Sig Start Date End Date Taking? Authorizing Provider  acetaminophen (TYLENOL) 325 MG tablet Take 2 tablets (650 mg total) by mouth every 6 (six) hours as needed for mild pain, moderate pain, fever or headache. 12/19/13   Jeralyn Bennett, MD  ALPRAZolam Prudy Feeler) 0.25 MG tablet Take 1 tablet (0.25 mg total) by mouth 2 (two) times daily as needed for anxiety. 12/19/13   Jeralyn Bennett, MD  bisacodyl (DULCOLAX) 5 MG EC tablet Take 5 mg by mouth daily as needed for mild constipation.     Historical Provider, MD  buPROPion (WELLBUTRIN SR) 150 MG 12 hr tablet Take 150 mg by mouth daily.     Historical Provider, MD  calcium citrate-vitamin D 200-200 MG-UNIT TABS Take 1 tablet by mouth daily.     Historical Provider, MD  cholecalciferol (VITAMIN D) 1000 UNITS tablet Take 1,000 Units by mouth every morning.     Historical Provider, MD  docusate sodium 100 MG CAPS Take 100 mg by mouth 2 (two) times daily. 11/05/13   Nishant Dhungel, MD  feeding supplement, ENSURE COMPLETE, (ENSURE COMPLETE) LIQD Take 237 mLs by mouth daily. 12/19/13   Jeralyn Bennett, MD  HYDROcodone-acetaminophen (NORCO/VICODIN) 5-325 MG per tablet Take 1 tablet by mouth every 4 (four) hours as needed for severe pain. 12/19/13   Jeralyn Bennett, MD    lactulose (CHRONULAC) 10 GM/15ML solution Take 30 mLs (20 g total) by mouth 2 (two) times daily as needed for moderate constipation. 12/19/13   Jeralyn Bennett, MD  lidocaine (LIDODERM) 5 % Place 1 patch onto the skin daily. Remove & Discard patch within 12 hours or as directed by MD 11/05/13   Eddie North, MD  losartan (COZAAR) 100 MG tablet Take 100 mg by mouth daily.  07/12/11   Historical Provider, MD  magnesium hydroxide (PHILLIPS CHEWS) 311 MG CHEW chewable tablet Chew 622 mg by mouth every 4 (four) hours as needed (constipation).    Historical Provider, MD  morphine (MSIR) 15 MG tablet Take 0.5 tablets (7.5 mg total) by mouth every 6 (six) hours as needed for severe pain. 12/19/13   Jeralyn Bennett, MD  Multiple Vitamins-Minerals (PRESERVISION AREDS 2 PO) Take 2 tablets by mouth daily.    Historical Provider, MD  polyethylene glycol (MIRALAX / GLYCOLAX) packet Take 17 g by mouth 3 (three) times daily. 12/19/13   Jeralyn Bennett, MD  potassium chloride SA (K-DUR,KLOR-CON) 20 MEQ tablet Take 1 tablet (20 mEq total) by mouth once. 12/19/13 12/21/13  Jeralyn Bennett, MD  promethazine (PHENERGAN) 25 MG tablet Take 25 mg by mouth every 8 (eight) hours as needed for nausea or vomiting.    Historical Provider, MD  senna (SENOKOT) 8.6 MG TABS tablet Take 2 tablets (17.2 mg total) by mouth 2 (  two) times daily. 12/19/13   Jeralyn BennettEzequiel Zamora, MD    Family History  Problem Relation Age of Onset  . Cancer Daughter     breast    History   Social History  . Marital Status: Widowed    Spouse Name: N/A    Number of Children: N/A  . Years of Education: N/A   Social History Main Topics  . Smoking status: Never Smoker   . Smokeless tobacco: Never Used  . Alcohol Use: No  . Drug Use: No  . Sexual Activity: No   Other Topics Concern  . None   Social History Narrative  . None     Review of Systems: A 12 point ROS discussed and pertinent positives are indicated in the HPI above.  All other systems  are negative.  Review of Systems  Constitutional: Positive for activity change and appetite change. Negative for unexpected weight change.  Respiratory: Negative for cough and shortness of breath.   Cardiovascular: Negative for chest pain.  Musculoskeletal: Positive for back pain and gait problem.  Psychiatric/Behavioral: Negative for behavioral problems and confusion.    Vital Signs: BP 154/73  Pulse 105  Temp(Src) 97.4 F (36.3 C) (Oral)  Resp 16  Ht 5' 2.25" (1.581 m)  Wt 39.917 kg (88 lb)  BMI 15.97 kg/m2  SpO2 98%  Physical Exam  Constitutional: She is oriented to person, place, and time.  frail  Cardiovascular: Normal rate and regular rhythm.   Pulmonary/Chest: Effort normal and breath sounds normal. She has no wheezes.  Abdominal: Soft. Bowel sounds are normal. There is no tenderness.  Musculoskeletal: Normal range of motion.  Back pain Left hip pain  Neurological: She is alert and oriented to person, place, and time.  Skin: Skin is warm and dry.  Psychiatric: She has a normal mood and affect. Her behavior is normal. Judgment and thought content normal.    Imaging: Dg Thoracic Spine 2 View  01/04/2014   CLINICAL DATA:  Low back pain.  History of compression fracture.  EXAM: THORACIC SPINE - 2 VIEW  COMPARISON:  CT abdomen and pelvis 12/14/2013.  FINDINGS: Since prior examination, the patient has undergone vertebral augmentation for a compression fracture of L1. There is a new mild biconcave compression fracture deformity of T12 with vertebral body height loss estimated at 20%. No other fracture is identified.  IMPRESSION: Findings consistent with acute or subacute biconcave compression fracture of T12 with vertebral body height loss estimated at 20%.  Status post vertebral augmentation for fracture of L1.   Electronically Signed   By: Drusilla Kannerhomas  Dalessio M.D.   On: 01/04/2014 18:01   Dg Lumbar Spine 2-3 Views  01/04/2014   CLINICAL DATA:  Low back pain.  No known injury.   EXAM: LUMBAR SPINE - 2-3 VIEW  COMPARISON:  11/02/2013; CT abdomen pelvis- 12/18/2013; lumbar spine MRI - 11/03/2013  FINDINGS: There are 5 non rib-bearing lumbar type vertebral bodies.  Re- demonstrated moderate scoliotic curvature of the mid thoracic spine, convex to the left, centered about the L3-L4 articulation.  The patient has undergone interval vertebroplasty/ kyphoplasty of the L2 vertebral body.  Stable vertebroplasty/ kyphoplasty of the L4 vertebral body.  Interval development of mild (approximately 25%) compression deformity involving the inferior endplate of the T12 vertebral body.  Unchanged mild (approximately 25%) compression deformity involving the inferior endplate of the L3 vertebral body. Remaining vertebral body heights appear preserved given obliquity.  There is grossly unchanged moderate to severe multilevel lumbar spine DDD, worse  at L3-L4 and to a lesser extent, L2-L3 and L4-L5 with disc space height loss, endplate irregularity and sclerosis.  Atherosclerotic plaque within the abdominal aorta. Regional soft tissues appear otherwise normal.  IMPRESSION: 1. Interval development of a mild (approximately 25%) compression deformity involving the inferior endplate of the T12 vertebral body. Correlation for point tenderness at this location is recommended. Further evaluation could be performed with lumbar spine MRI as clinically indicated. 2. Interval vertebroplasty/kyphoplasty of the L1 vertebral body. 3. Unchanged vertebroplasty/kyphoplasty of the L4 vertebral body. 4. Unchanged focal scoliotic curvature of the lumbar spine, convex to the left, centered about the L3-L4 articulation. 5. Grossly unchanged moderate severe multilevel lumbar spine DDD, worse at L3-L4.   Electronically Signed   By: Simonne Come M.D.   On: 01/04/2014 18:03   Mr Thoracic Spine Wo Contrast  01/04/2014   CLINICAL DATA:  Lower back pain, possibly urinary tract infection. History of compression fracture. Dysuria, with  bladder catheter in place. Assess compression fractures.  EXAM: MRI THORACIC AND LUMBAR SPINE WITHOUT CONTRAST  TECHNIQUE: Multiplanar and multiecho pulse sequences of the thoracic and lumbar spine were obtained without intravenous contrast.  COMPARISON:  Thoracic spine radiographs January 04, 2014, MRI of the lumbar spine November 03, 2013, CT of the abdomen pelvis November 27, 2013  FINDINGS: MR THORACIC SPINE FINDINGS  Mild T12 compression fracture with less than 20% height loss. Low T1, bright STIR signal within the vertebral body consistent with edema with inferior anterior endplate fracture. No retropulsed bony fragments. The remaining thoracic vertebral bodies are intact. No malalignment. Maintenance thoracic kyphosis. Intervertebral discs demonstrate overall normal morphology. Decreased T2 signal in all discs consistent with mild desiccation. Generalized bright T1 bone marrow signal likely represents osteopenia.  Thoracic spinal cord is normal in morphology and signal characteristics to the level the conus medullaris which terminates at L1. No syrinx.  Partially imaged at least 24 x 15 mm T2 bright lesion a RIGHT thyroid gland would be better characterized on dedicated thyroid sonogram on a nonemergent basis. 8 mm RIGHT T2 perineural cysts, with additional scattered subcentimeter perineural cysts to the level of T10.  Small central disc protrusion at T5-6 contacts the ventral spinal cord without canal stenosis. No neural foraminal narrowing at any thoracic level.  MR LUMBAR SPINE FINDINGS  Moderate to severe L1 burst fracture, with central low T1 and low T2 signal consistent with cement augmentation. 4 mm retropulsed bony fragments. Approximately 50% central vertebral body height loss, relatively unchanged. Moderate to severe chronic L4 insufficiency fracture. Moderate L3 chronic insufficiency fracture. Intact L2 and L5 vertebral bodies. Lumbar lordosis maintained. No malalignment.  Generalized bright T1 bone  marrow signal suggest osteopenia.  Severe L2-3 disc degeneration with central bright STIR signal likely reflecting edema. Moderate L3-4, L4-5 moderate to severe L5-S1 disc height loss, with moderate multilevel disc degeneration. Severe L3-4, L4-5 acute on chronic discogenic endplate changes. Moderate to severe chronic L2-3 discogenic endplate changes, mild at L5-S1.  Conus medullaris terminates at T12-L1 and appears normal in morphology and signal characteristics. Central displacement of the cauda equina due to canal stenosis, unchanged. Moderate to severe paraspinal muscle atrophy.  Level by level evaluation:  T12-L1: Retropulsed bony fragments L1, mild facet arthropathy. Mild to moderate canal stenosis with at least moderate RIGHT neural foraminal narrowing.  L2-3: Small broad-based disc osteophyte complex, mild facet arthropathy and ligamentum flavum redundancy. No canal stenosis though, partial effacement of the RIGHT lateral recess may affect the traversing RIGHT L3 nerve. Mild LEFT  greater than RIGHT neural foraminal narrowing.  L3-4: Moderate to large broad-based disc osteophyte complex asymmetric to the RIGHT may affect the exited RIGHT L3 nerve. Moderate to severe facet arthropathy and ligamentum flavum redundancy. Moderate to severe canal stenosis including LEFT lateral recess effacement which likely affect the traversing LEFT L4 nerve. Severe RIGHT, moderate LEFT neural foraminal narrowing.  L4-5: Large broad-based disc osteophyte complex asymmetric to the LEFT may affect the exiting LEFT L4 nerve. Severe LEFT greater than RIGHT facet arthropathy and ligamentum flavum redundancy. Severe canal stenosis with LEFT lateral recess effacement, likely affecting the traversing LEFT L5 nerve. Mild to moderate RIGHT, severe LEFT neural foraminal narrowing.  L5-S1: Small broad-based disc osteophyte complex asymmetric to the LEFT encroaches upon the exited LEFT L5 nerve. Severe LEFT and moderate RIGHT facet  arthropathy ligamentum flavum redundancy. No canal stenosis. Partial effacement LEFT lateral recess may affect the traversing LEFT S1 nerve. Severe LEFT neural foraminal narrowing.  IMPRESSION: MR THORACIC SPINE IMPRESSION  Acute mild T12 compression/insufficiency fracture. No additional thoracic spine fractures. No malalignment.  No neurocompressive changes of the thoracic spine.  MR LUMBAR SPINE IMPRESSION  No acute fracture nor malalignment. L1 and L4 fractures, status post cement augmentation. Stable moderate L3 compression fracture. No progressed height loss of these fractures from prior imaging.  Lumbar spondylosis. Severe canal stenosis at L4-5, moderate to severe at L3-4.  Neural foraminal narrowing at all lumbar levels: Severe on the RIGHT at L3-4, severe on the LEFT at L4-5 and L5-S1.   Electronically Signed   By: Awilda Metro   On: 01/04/2014 22:36   Mr Lumbar Spine Wo Contrast  01/04/2014   CLINICAL DATA:  Lower back pain, possibly urinary tract infection. History of compression fracture. Dysuria, with bladder catheter in place. Assess compression fractures.  EXAM: MRI THORACIC AND LUMBAR SPINE WITHOUT CONTRAST  TECHNIQUE: Multiplanar and multiecho pulse sequences of the thoracic and lumbar spine were obtained without intravenous contrast.  COMPARISON:  Thoracic spine radiographs January 04, 2014, MRI of the lumbar spine November 03, 2013, CT of the abdomen pelvis November 27, 2013  FINDINGS: MR THORACIC SPINE FINDINGS  Mild T12 compression fracture with less than 20% height loss. Low T1, bright STIR signal within the vertebral body consistent with edema with inferior anterior endplate fracture. No retropulsed bony fragments. The remaining thoracic vertebral bodies are intact. No malalignment. Maintenance thoracic kyphosis. Intervertebral discs demonstrate overall normal morphology. Decreased T2 signal in all discs consistent with mild desiccation. Generalized bright T1 bone marrow signal likely  represents osteopenia.  Thoracic spinal cord is normal in morphology and signal characteristics to the level the conus medullaris which terminates at L1. No syrinx.  Partially imaged at least 24 x 15 mm T2 bright lesion a RIGHT thyroid gland would be better characterized on dedicated thyroid sonogram on a nonemergent basis. 8 mm RIGHT T2 perineural cysts, with additional scattered subcentimeter perineural cysts to the level of T10.  Small central disc protrusion at T5-6 contacts the ventral spinal cord without canal stenosis. No neural foraminal narrowing at any thoracic level.  MR LUMBAR SPINE FINDINGS  Moderate to severe L1 burst fracture, with central low T1 and low T2 signal consistent with cement augmentation. 4 mm retropulsed bony fragments. Approximately 50% central vertebral body height loss, relatively unchanged. Moderate to severe chronic L4 insufficiency fracture. Moderate L3 chronic insufficiency fracture. Intact L2 and L5 vertebral bodies. Lumbar lordosis maintained. No malalignment.  Generalized bright T1 bone marrow signal suggest osteopenia.  Severe L2-3 disc  degeneration with central bright STIR signal likely reflecting edema. Moderate L3-4, L4-5 moderate to severe L5-S1 disc height loss, with moderate multilevel disc degeneration. Severe L3-4, L4-5 acute on chronic discogenic endplate changes. Moderate to severe chronic L2-3 discogenic endplate changes, mild at L5-S1.  Conus medullaris terminates at T12-L1 and appears normal in morphology and signal characteristics. Central displacement of the cauda equina due to canal stenosis, unchanged. Moderate to severe paraspinal muscle atrophy.  Level by level evaluation:  T12-L1: Retropulsed bony fragments L1, mild facet arthropathy. Mild to moderate canal stenosis with at least moderate RIGHT neural foraminal narrowing.  L2-3: Small broad-based disc osteophyte complex, mild facet arthropathy and ligamentum flavum redundancy. No canal stenosis though,  partial effacement of the RIGHT lateral recess may affect the traversing RIGHT L3 nerve. Mild LEFT greater than RIGHT neural foraminal narrowing.  L3-4: Moderate to large broad-based disc osteophyte complex asymmetric to the RIGHT may affect the exited RIGHT L3 nerve. Moderate to severe facet arthropathy and ligamentum flavum redundancy. Moderate to severe canal stenosis including LEFT lateral recess effacement which likely affect the traversing LEFT L4 nerve. Severe RIGHT, moderate LEFT neural foraminal narrowing.  L4-5: Large broad-based disc osteophyte complex asymmetric to the LEFT may affect the exiting LEFT L4 nerve. Severe LEFT greater than RIGHT facet arthropathy and ligamentum flavum redundancy. Severe canal stenosis with LEFT lateral recess effacement, likely affecting the traversing LEFT L5 nerve. Mild to moderate RIGHT, severe LEFT neural foraminal narrowing.  L5-S1: Small broad-based disc osteophyte complex asymmetric to the LEFT encroaches upon the exited LEFT L5 nerve. Severe LEFT and moderate RIGHT facet arthropathy ligamentum flavum redundancy. No canal stenosis. Partial effacement LEFT lateral recess may affect the traversing LEFT S1 nerve. Severe LEFT neural foraminal narrowing.  IMPRESSION: MR THORACIC SPINE IMPRESSION  Acute mild T12 compression/insufficiency fracture. No additional thoracic spine fractures. No malalignment.  No neurocompressive changes of the thoracic spine.  MR LUMBAR SPINE IMPRESSION  No acute fracture nor malalignment. L1 and L4 fractures, status post cement augmentation. Stable moderate L3 compression fracture. No progressed height loss of these fractures from prior imaging.  Lumbar spondylosis. Severe canal stenosis at L4-5, moderate to severe at L3-4.  Neural foraminal narrowing at all lumbar levels: Severe on the RIGHT at L3-4, severe on the LEFT at L4-5 and L5-S1.   Electronically Signed   By: Awilda Metro   On: 01/04/2014 22:36   Dg Abd Portable 1v  12/19/2013    CLINICAL DATA:  Follow-up fecal impaction.  EXAM: PORTABLE ABDOMEN - 1 VIEW  COMPARISON:  12/18/2013  FINDINGS: There is at least moderate stool burden in the lower abdomen and pelvis. Gas filled loops of bowel but there is a nonobstructive bowel gas pattern. Again noted is scoliosis in the lumbar spine with two levels of vertebral body augmentation.  IMPRESSION: Moderate stool burden in the abdomen and pelvis.   Electronically Signed   By: Richarda Overlie M.D.   On: 12/19/2013 06:56   Dg Abd Portable 1v  12/18/2013   CLINICAL DATA:  Evaluate fecal impaction. Patient had a bowel movement this morning.  EXAM: PORTABLE ABDOMEN - 1 VIEW  COMPARISON:  CT, 12/14/2013  FINDINGS: There is still significant stool throughout the colon and in the rectum, but this appears decreased from the prior CT most evident in the sigmoid colon. There is no significant bowel distention on the current exam. No evidence of obstruction.  IMPRESSION: There has been a decrease in rectal and colonic stool since prior study, although the  stool burden remains moderately increased. No bowel obstruction.   Electronically Signed   By: Amie Portland M.D.   On: 12/18/2013 08:00   Ir Kypho Vertebral Lumbar Augmentation  12/17/2013   CLINICAL DATA:  New onset of severe low back pain secondary to compression fracture at L1 .  EXAM: KYPHOPLASTY AT L1  PROCEDURE: Following a full explanation of the procedure along with the potential associated complications, an informed witnessed consent was obtained.  The patient was placed prone on the fluoroscopic table. The skin overlying the lumbar region was then prepped and draped in the usual sterile fashion. The right pedicle at L1 was then infiltrated with 0.25% bupivacaine followed by the advancement of an 10.5-gauge DFine needle through the right pedicle into the posterior one-third at L1.  Through the working needle, a DFine core biopsy device was then advanced distal to the tip of the needle. Using a 20 mL  syringe, a core biopsy was obtained and sent for pathologic analysis.  Through the working needle, a DFine void creating device was then advanced and manipulated in different directions using biplane intermittent fluoroscopy. This was then removed. The needle was then advanced to the junction of the anterior and middle one-thirds at L1.  At this time methylmethacrylate mixture was reconstituted with tobramycin in the DFine mixing system. This was then loaded onto the DFine injector device. The injector device was then locked into position at the hub of the working needle.  Using biplane intermittent fluoroscopy, pulse delivery of the methylmethacrylate mixture was then performed at L1 with excellent filling in the AP and lateral projections.  There was no extravasation seen into the adjacent disc spaces, or posteriorly into the spinal canal. No paraspinous venous contamination was seen. The working needle and injector device were then retrieved and removed. Hemostasis was achieved at the skin entry site.  The patient tolerated the procedure well. There were no acute complications.  Medications utilized: Versed 2 mg mg IV. Fentanyl 50 mcg micrograms IV. Dilaudid 1 mg IV.  Romazicon 0.25 mg IV. This was given because of the patient's decreasing oxygen saturations into the 80s momentarily. The patient responded promptly to the IV Romazicon  Total Moderate Sedation Time:  30 minutes.  IMPRESSION: 1. Status post fluoroscopic-guided needle placement for deep core bone biopsy at L1.  2. Status post vertebral body augmentation using DFine kyphoplasty at L1 as described without event.   Electronically Signed   By: Julieanne Cotton M.D.   On: 12/16/2013 16:37   Ir Radiologist Eval & Mgmt  01/05/2014   EXAM: ESTABLISHED PATIENT OFFICE VISIT  CHIEF COMPLAINT: Severe low back pain.  Current Pain Level: 8-10  HISTORY OF PRESENT ILLNESS: The patient is a 80 year old lady who presents with her family with history of new onset  of severe low back pain. It is unclear from the patient or the family as to when exactly the worsening of the pain started.  She underwent a vertebral body augmentation approximately 2 weeks ago at L1. This did produce significant relief in terms of decreasing her pain medication.  Also she was able to improve her ability and be involved in physical therapy.  It is unclear as to when the worsening of this new pain occurred. However, according to the family the patient fell a few days ago.  At the present time the patient has severe pain in the lumbar region which is almost constant, and relieved to some degree by lying down. She also takes oxycodone 5  mg 4 times a day which does provide some relief.  The patient has great difficulty in ambulating at the present time. She has, however, been involved with physical therapy over the past few days. She denies any radiation of pain into her lower extremities in a radicular manner. She denies any symptoms of fever, chills or rigors or symptoms or signs of UTI.  Her appetite has significantly decreased with resultant loss of weight.  Past Medical History:  As before has high blood pressure.  Previous Surgeries: Vertebroplasty kyphoplasty in September of this year.  Medications: Wellbutrin. Cozaar. Lidoderm patch. Vitamin D3. Calcium citrate tablets. MiraLax. Colace. Senna. Xanax 0.25 mg twice daily. Oxycodone 5 mg 4 times a day.  Allergies:  Denies any allergies at this time.  PHYSICAL EXAMINATION: The patient is in severe distress on account of her back pain.  Grossly, however, she remains alert, oriented and appropriately responsive.  Palpation of the back region revealed severe tenderness at the lumbar level.  ASSESSMENT AND PLAN: Given the patient's above history and physical exam, the patient and her family were informed that a work up to rule out a new fracture was probably needed. This would entail getting MRI scan of the lumbar spine and the thoracic spine. An  emergent thoracic and lumbar MRI could not be arranged as outpatient given the clearance needed from her insurance company. An option was for the patient to present to the ER and be evaluated there for an emergent workup and possible admission. This was explained to the patient and the patient's family. ER was contacted in this regard.   Electronically Signed   By: Julieanne CottonSanjeev  Deveshwar M.D.   On: 01/04/2014 13:56    Labs:  CBC:  Recent Labs  12/15/13 0418 12/16/13 0412 12/17/13 0433 01/15/14 1200  WBC 6.0 6.3 8.1 6.0  HGB 12.0 11.2* 12.4 11.5*  HCT 34.8* 32.9* 35.8* 33.9*  PLT 355 325 411* 303    COAGS:  Recent Labs  11/27/13 0758 01/15/14 1200  INR 0.94 0.97  APTT 28 28    BMP:  Recent Labs  12/16/13 0412 12/17/13 0433 12/19/13 0433 01/15/14 1200  NA 133* 134* 134* 139  K 3.0* 3.9 3.2* 3.2*  CL 96 97 97 100  CO2 25 24 25 28   GLUCOSE 105* 110* 118* 112*  BUN 8 8 9 12   CALCIUM 8.5 9.0 8.7 8.9  CREATININE 0.45* 0.46* 0.45* 0.52  GFRNONAA 85* 84* 85* 81*  GFRAA >90 >90 >90 >90    LIVER FUNCTION TESTS:  Recent Labs  11/04/13 0500 12/14/13 0535 12/15/13 0418  BILITOT 0.5 0.4 0.3  AST 15 21 15   ALT 11 15 13   ALKPHOS 48 98 89  PROT 5.9* 6.9 6.1  ALBUMIN 3.2* 3.4* 3.0*    TUMOR MARKERS: No results found for this basename: AFPTM, CEA, CA199, CHROMGRNA,  in the last 8760 hours  Assessment and Plan:  New back pain MRI 10/12 reveals T 12 fx Amenable to VP/KP per Dr Corliss Skainseveshwar Pt aware of procedure benefits and risks and agreeable to proceed Consent signed andin chart  Thank you for this interesting consult.  I greatly enjoyed meeting Darrell JewelBarbara P Cedillo and look forward to participating in their care.    I spent a total of 20 minutes face to face in clinical consultation, greater than 50% of which was counseling/coordinating care for T12 VP/KP  Signed: Tyshawn Ciullo A 01/15/2014, 2:02 PM

## 2014-01-17 ENCOUNTER — Inpatient Hospital Stay (HOSPITAL_COMMUNITY)
Admission: EM | Admit: 2014-01-17 | Discharge: 2014-01-19 | DRG: 071 | Disposition: A | Discharge status: Skilled Nursing Facility | Payer: Medicare Other | Attending: Internal Medicine | Admitting: Internal Medicine

## 2014-01-17 ENCOUNTER — Emergency Department (HOSPITAL_COMMUNITY): Payer: Medicare Other

## 2014-01-17 ENCOUNTER — Encounter (HOSPITAL_COMMUNITY): Payer: Self-pay | Admitting: Emergency Medicine

## 2014-01-17 DIAGNOSIS — M1990 Unspecified osteoarthritis, unspecified site: Secondary | ICD-10-CM | POA: Diagnosis present

## 2014-01-17 DIAGNOSIS — T40605A Adverse effect of unspecified narcotics, initial encounter: Secondary | ICD-10-CM | POA: Diagnosis present

## 2014-01-17 DIAGNOSIS — D649 Anemia, unspecified: Secondary | ICD-10-CM | POA: Diagnosis present

## 2014-01-17 DIAGNOSIS — E876 Hypokalemia: Secondary | ICD-10-CM | POA: Diagnosis present

## 2014-01-17 DIAGNOSIS — I10 Essential (primary) hypertension: Secondary | ICD-10-CM | POA: Diagnosis present

## 2014-01-17 DIAGNOSIS — H269 Unspecified cataract: Secondary | ICD-10-CM | POA: Diagnosis present

## 2014-01-17 DIAGNOSIS — G9340 Encephalopathy, unspecified: Secondary | ICD-10-CM | POA: Diagnosis not present

## 2014-01-17 DIAGNOSIS — H3530 Unspecified macular degeneration: Secondary | ICD-10-CM | POA: Diagnosis present

## 2014-01-17 DIAGNOSIS — S2242XA Multiple fractures of ribs, left side, initial encounter for closed fracture: Secondary | ICD-10-CM | POA: Diagnosis present

## 2014-01-17 DIAGNOSIS — M810 Age-related osteoporosis without current pathological fracture: Secondary | ICD-10-CM | POA: Diagnosis present

## 2014-01-17 DIAGNOSIS — Z5189 Encounter for other specified aftercare: Secondary | ICD-10-CM | POA: Diagnosis not present

## 2014-01-17 DIAGNOSIS — Z681 Body mass index (BMI) 19 or less, adult: Secondary | ICD-10-CM

## 2014-01-17 DIAGNOSIS — W1839XA Other fall on same level, initial encounter: Secondary | ICD-10-CM | POA: Diagnosis present

## 2014-01-17 DIAGNOSIS — E785 Hyperlipidemia, unspecified: Secondary | ICD-10-CM | POA: Diagnosis present

## 2014-01-17 DIAGNOSIS — F329 Major depressive disorder, single episode, unspecified: Secondary | ICD-10-CM

## 2014-01-17 DIAGNOSIS — S2239XA Fracture of one rib, unspecified side, initial encounter for closed fracture: Secondary | ICD-10-CM | POA: Diagnosis present

## 2014-01-17 DIAGNOSIS — Z9181 History of falling: Secondary | ICD-10-CM | POA: Diagnosis not present

## 2014-01-17 DIAGNOSIS — Z79899 Other long term (current) drug therapy: Secondary | ICD-10-CM | POA: Diagnosis not present

## 2014-01-17 DIAGNOSIS — S2232XA Fracture of one rib, left side, initial encounter for closed fracture: Secondary | ICD-10-CM

## 2014-01-17 DIAGNOSIS — M5136 Other intervertebral disc degeneration, lumbar region: Secondary | ICD-10-CM | POA: Diagnosis present

## 2014-01-17 DIAGNOSIS — Z66 Do not resuscitate: Secondary | ICD-10-CM | POA: Diagnosis present

## 2014-01-17 DIAGNOSIS — S2249XA Multiple fractures of ribs, unspecified side, initial encounter for closed fracture: Secondary | ICD-10-CM | POA: Diagnosis present

## 2014-01-17 DIAGNOSIS — W19XXXA Unspecified fall, initial encounter: Secondary | ICD-10-CM

## 2014-01-17 DIAGNOSIS — R079 Chest pain, unspecified: Secondary | ICD-10-CM | POA: Diagnosis not present

## 2014-01-17 DIAGNOSIS — F32A Depression, unspecified: Secondary | ICD-10-CM

## 2014-01-17 DIAGNOSIS — M549 Dorsalgia, unspecified: Secondary | ICD-10-CM

## 2014-01-17 DIAGNOSIS — E46 Unspecified protein-calorie malnutrition: Secondary | ICD-10-CM | POA: Diagnosis present

## 2014-01-17 LAB — CBC WITH DIFFERENTIAL/PLATELET
BASOS ABS: 0.1 10*3/uL (ref 0.0–0.1)
Basophils Relative: 1 % (ref 0–1)
EOS ABS: 0.3 10*3/uL (ref 0.0–0.7)
EOS PCT: 3 % (ref 0–5)
HEMATOCRIT: 34.9 % — AB (ref 36.0–46.0)
Hemoglobin: 11.6 g/dL — ABNORMAL LOW (ref 12.0–15.0)
Lymphocytes Relative: 12 % (ref 12–46)
Lymphs Abs: 0.9 10*3/uL (ref 0.7–4.0)
MCH: 32.1 pg (ref 26.0–34.0)
MCHC: 33.2 g/dL (ref 30.0–36.0)
MCV: 96.7 fL (ref 78.0–100.0)
MONO ABS: 0.8 10*3/uL (ref 0.1–1.0)
Monocytes Relative: 11 % (ref 3–12)
Neutro Abs: 5.8 10*3/uL (ref 1.7–7.7)
Neutrophils Relative %: 73 % (ref 43–77)
PLATELETS: 346 10*3/uL (ref 150–400)
RBC: 3.61 MIL/uL — ABNORMAL LOW (ref 3.87–5.11)
RDW: 13.3 % (ref 11.5–15.5)
WBC: 7.8 10*3/uL (ref 4.0–10.5)

## 2014-01-17 LAB — COMPREHENSIVE METABOLIC PANEL
ALBUMIN: 3.1 g/dL — AB (ref 3.5–5.2)
ALT: 11 U/L (ref 0–35)
AST: 16 U/L (ref 0–37)
Alkaline Phosphatase: 167 U/L — ABNORMAL HIGH (ref 39–117)
Anion gap: 13 (ref 5–15)
BUN: 19 mg/dL (ref 6–23)
CALCIUM: 9.5 mg/dL (ref 8.4–10.5)
CO2: 28 meq/L (ref 19–32)
CREATININE: 0.82 mg/dL (ref 0.50–1.10)
Chloride: 103 mEq/L (ref 96–112)
GFR calc Af Amer: 70 mL/min — ABNORMAL LOW (ref >90)
GFR, EST NON AFRICAN AMERICAN: 61 mL/min — AB (ref >90)
Glucose, Bld: 108 mg/dL — ABNORMAL HIGH (ref 70–99)
Potassium: 3.4 mEq/L — ABNORMAL LOW (ref 3.7–5.3)
Sodium: 144 mEq/L (ref 137–147)
TOTAL PROTEIN: 6.1 g/dL (ref 6.0–8.3)
Total Bilirubin: 0.3 mg/dL (ref 0.3–1.2)

## 2014-01-17 LAB — URINALYSIS, ROUTINE W REFLEX MICROSCOPIC
Bilirubin Urine: NEGATIVE
Glucose, UA: NEGATIVE mg/dL
Hgb urine dipstick: NEGATIVE
Ketones, ur: NEGATIVE mg/dL
Leukocytes, UA: NEGATIVE
Nitrite: NEGATIVE
PROTEIN: NEGATIVE mg/dL
SPECIFIC GRAVITY, URINE: 1.015 (ref 1.005–1.030)
UROBILINOGEN UA: 0.2 mg/dL (ref 0.0–1.0)
pH: 6.5 (ref 5.0–8.0)

## 2014-01-17 LAB — CBC
HEMATOCRIT: 32.3 % — AB (ref 36.0–46.0)
Hemoglobin: 10.7 g/dL — ABNORMAL LOW (ref 12.0–15.0)
MCH: 31.7 pg (ref 26.0–34.0)
MCHC: 33.1 g/dL (ref 30.0–36.0)
MCV: 95.6 fL (ref 78.0–100.0)
Platelets: 315 10*3/uL (ref 150–400)
RBC: 3.38 MIL/uL — ABNORMAL LOW (ref 3.87–5.11)
RDW: 13.2 % (ref 11.5–15.5)
WBC: 7.2 10*3/uL (ref 4.0–10.5)

## 2014-01-17 LAB — CREATININE, SERUM
Creatinine, Ser: 0.65 mg/dL (ref 0.50–1.10)
GFR calc non Af Amer: 75 mL/min — ABNORMAL LOW (ref >90)
GFR, EST AFRICAN AMERICAN: 87 mL/min — AB (ref >90)

## 2014-01-17 LAB — AMMONIA: Ammonia: 22 umol/L (ref 11–60)

## 2014-01-17 LAB — MRSA PCR SCREENING: MRSA BY PCR: NEGATIVE

## 2014-01-17 MED ORDER — ACETAMINOPHEN 325 MG PO TABS: 650.0000 mg | ORAL_TABLET | ORAL | Status: DC | PRN

## 2014-01-17 MED ORDER — LACTULOSE 10 GM/15ML PO SOLN
20.0000 g | Freq: Two times a day (BID) | ORAL | Status: DC | PRN
  Filled 2014-01-17: qty 30

## 2014-01-17 MED ORDER — ENSURE COMPLETE PO LIQD
237.0000 mL | Freq: Two times a day (BID) | ORAL | Status: DC
  Administered 2014-01-17 – 2014-01-19 (×3): 237 mL via ORAL

## 2014-01-17 MED ORDER — LIDOCAINE 5 % EX PTCH
0.5000 | MEDICATED_PATCH | CUTANEOUS | Status: DC
  Administered 2014-01-17: 0.5 via TRANSDERMAL
  Filled 2014-01-17 (×2): qty 1

## 2014-01-17 MED ORDER — PRO-STAT SUGAR FREE PO LIQD
30.0000 mL | Freq: Two times a day (BID) | ORAL | Status: DC
  Administered 2014-01-17 – 2014-01-19 (×4): 30 mL via ORAL
  Filled 2014-01-17 (×4): qty 30

## 2014-01-17 MED ORDER — ENOXAPARIN SODIUM 30 MG/0.3ML ~~LOC~~ SOLN
30.0000 mg | Freq: Every day | SUBCUTANEOUS | Status: DC
  Administered 2014-01-17 – 2014-01-18 (×2): 30 mg via SUBCUTANEOUS
  Filled 2014-01-17 (×2): qty 0.3

## 2014-01-17 MED ORDER — FENTANYL CITRATE 0.05 MG/ML IJ SOLN
25.0000 ug | Freq: Once | INTRAMUSCULAR | Status: AC
  Administered 2014-01-17: 25 ug via INTRAVENOUS
  Filled 2014-01-17: qty 2

## 2014-01-17 MED ORDER — VITAMIN D 1000 UNITS PO TABS
5000.0000 [IU] | ORAL_TABLET | Freq: Every day | ORAL | Status: DC
  Administered 2014-01-18 – 2014-01-19 (×2): 5000 [IU] via ORAL
  Filled 2014-01-17 (×2): qty 5

## 2014-01-17 MED ORDER — ENOXAPARIN SODIUM 40 MG/0.4ML ~~LOC~~ SOLN: 40.0000 mg | SUBCUTANEOUS | Status: DC

## 2014-01-17 MED ORDER — MELATONIN 3 MG PO TABS: 1.0000 | ORAL_TABLET | Freq: Every day | ORAL | Status: DC

## 2014-01-17 MED ORDER — FENTANYL CITRATE 0.05 MG/ML IJ SOLN
25.0000 ug | INTRAMUSCULAR | Status: DC | PRN
  Administered 2014-01-18 (×2): 25 ug via INTRAVENOUS
  Filled 2014-01-17 (×2): qty 2

## 2014-01-17 MED ORDER — OXYCODONE HCL 5 MG PO TABS
5.0000 mg | ORAL_TABLET | ORAL | Status: DC | PRN
  Administered 2014-01-17 – 2014-01-19 (×3): 5 mg via ORAL
  Filled 2014-01-17 (×3): qty 1

## 2014-01-17 MED ORDER — MORPHINE SULFATE 15 MG PO TABS
7.5000 mg | ORAL_TABLET | Freq: Three times a day (TID) | ORAL | Status: DC
  Administered 2014-01-18 (×3): 7.5 mg via ORAL
  Filled 2014-01-17 (×3): qty 1

## 2014-01-17 MED ORDER — LOSARTAN POTASSIUM 50 MG PO TABS
100.0000 mg | ORAL_TABLET | Freq: Every day | ORAL | Status: DC
  Administered 2014-01-18 – 2014-01-19 (×2): 100 mg via ORAL
  Filled 2014-01-17 (×2): qty 2

## 2014-01-17 MED ORDER — SODIUM CHLORIDE 0.9 % IV SOLN
INTRAVENOUS | Status: AC
  Administered 2014-01-17: 22:00:00 via INTRAVENOUS

## 2014-01-17 MED ORDER — BISACODYL 5 MG PO TBEC
5.0000 mg | DELAYED_RELEASE_TABLET | Freq: Every day | ORAL | Status: DC | PRN
  Filled 2014-01-17: qty 1

## 2014-01-17 MED ORDER — BUPROPION HCL ER (SR) 150 MG PO TB12
150.0000 mg | ORAL_TABLET | Freq: Every day | ORAL | Status: DC
  Administered 2014-01-18 – 2014-01-19 (×2): 150 mg via ORAL
  Filled 2014-01-17 (×2): qty 1

## 2014-01-17 MED ORDER — RISAQUAD PO CAPS
2.0000 | ORAL_CAPSULE | Freq: Two times a day (BID) | ORAL | Status: DC
  Administered 2014-01-17 – 2014-01-19 (×4): 2 via ORAL
  Filled 2014-01-17 (×4): qty 2

## 2014-01-17 MED ORDER — VITAMIN D3 25 MCG (1000 UNIT) PO TABS: 2000.0000 [IU] | ORAL_TABLET | Freq: Every day | ORAL | Status: DC

## 2014-01-17 MED ORDER — ALPRAZOLAM 0.25 MG PO TABS
0.2500 mg | ORAL_TABLET | Freq: Every evening | ORAL | Status: DC
  Administered 2014-01-17 – 2014-01-18 (×2): 0.25 mg via ORAL
  Filled 2014-01-17 (×2): qty 1

## 2014-01-17 MED ORDER — LUBIPROSTONE 24 MCG PO CAPS
24.0000 ug | ORAL_CAPSULE | Freq: Two times a day (BID) | ORAL | Status: DC
  Administered 2014-01-18 – 2014-01-19 (×3): 24 ug via ORAL
  Filled 2014-01-17 (×4): qty 1

## 2014-01-17 MED ORDER — GABAPENTIN 100 MG PO CAPS
100.0000 mg | ORAL_CAPSULE | Freq: Every day | ORAL | Status: DC
  Administered 2014-01-17 – 2014-01-18 (×2): 100 mg via ORAL
  Filled 2014-01-17 (×2): qty 1

## 2014-01-17 NOTE — ED Provider Notes (Signed)
CSN: [id number]     Arrival date & time 01/17/14  1737 History   First MD Initiated Contact with Patient 01/17/14 1747     Chief Complaint  Patient presents with  . Fall     (Consider location/radiation/quality/duration/timing/severity/associated sxs/prior Treatment) HPI 80 year old female presents with a fall today at the rehabilitation facility. The patient has multiple back issues and is currently in rehabilitation facility after having spinal injection. The patient's has been acting delirious since last night according to family. She is somewhat demented at baseline but recently has been confused on and she is in was walking down the halls naked. She was also smearing feces on the walls. No known fevers. The patient currently fell when none of the staff was around and seemed of injured her left ribs. The patient keeps complaining of left rib pain but does not remember what happened. 8 days ago the patient was diagnosed with UTI and put on amoxicillin and finished a couple days ago. She had similar symptoms to this at that time. Patient is also on Xanax and oxycodone and there was concern with family this may be causing her to be acutely delirious.  Past Medical History  Diagnosis Date  . Hypertension   . Osteoporosis   . Macular degeneration   . Arthritis   . Hyperlipidemia    Past Surgical History  Procedure Laterality Date  . Appendectomy    . Eye surgery    . Back surgery     Family History  Problem Relation Age of Onset  . Cancer Daughter     breast   History  Substance Use Topics  . Smoking status: Never Smoker   . Smokeless tobacco: Never Used  . Alcohol Use: No   OB History   Grav Para Term Preterm Abortions TAB SAB Ect Mult Living   3 3 3             Review of Systems  Unable to perform ROS: Mental status change      Allergies  Macrobid  Home Medications   Prior to Admission medications   Medication Sig Start Date End Date Taking? Authorizing  Provider  acetaminophen (TYLENOL) 325 MG tablet Take 2 tablets (650 mg total) by mouth every 6 (six) hours as needed for mild pain, moderate pain, fever or headache. 12/19/13   Jeralyn Bennett, MD  ALPRAZolam Prudy Feeler) 0.25 MG tablet Take 1 tablet (0.25 mg total) by mouth 2 (two) times daily as needed for anxiety. 12/19/13   Jeralyn Bennett, MD  bisacodyl (DULCOLAX) 5 MG EC tablet Take 5 mg by mouth daily as needed for mild constipation.     Historical Provider, MD  buPROPion (WELLBUTRIN SR) 150 MG 12 hr tablet Take 150 mg by mouth daily.     Historical Provider, MD  calcium citrate-vitamin D 200-200 MG-UNIT TABS Take 1 tablet by mouth daily.     Historical Provider, MD  cholecalciferol (VITAMIN D) 1000 UNITS tablet Take 1,000 Units by mouth every morning.     Historical Provider, MD  docusate sodium 100 MG CAPS Take 100 mg by mouth 2 (two) times daily. 11/05/13   Nishant Dhungel, MD  feeding supplement, ENSURE COMPLETE, (ENSURE COMPLETE) LIQD Take 237 mLs by mouth daily. 12/19/13   Jeralyn Bennett, MD  HYDROcodone-acetaminophen (NORCO/VICODIN) 5-325 MG per tablet Take 1 tablet by mouth every 4 (four) hours as needed for severe pain. 12/19/13   Jeralyn Bennett, MD  lactulose (CHRONULAC) 10 GM/15ML solution Take 30 mLs (20 g total)  by mouth 2 (two) times daily as needed for moderate constipation. 12/19/13   Jeralyn BennettEzequiel Zamora, MD  lidocaine (LIDODERM) 5 % Place 1 patch onto the skin daily. Remove & Discard patch within 12 hours or as directed by MD 11/05/13   Eddie NorthNishant Dhungel, MD  losartan (COZAAR) 100 MG tablet Take 100 mg by mouth daily.  07/12/11   Historical Provider, MD  magnesium hydroxide (PHILLIPS CHEWS) 311 MG CHEW chewable tablet Chew 622 mg by mouth every 4 (four) hours as needed (constipation).    Historical Provider, MD  morphine (MSIR) 15 MG tablet Take 0.5 tablets (7.5 mg total) by mouth every 6 (six) hours as needed for severe pain. 12/19/13   Jeralyn BennettEzequiel Zamora, MD  Multiple Vitamins-Minerals  (PRESERVISION AREDS 2 PO) Take 2 tablets by mouth daily.    Historical Provider, MD  polyethylene glycol (MIRALAX / GLYCOLAX) packet Take 17 g by mouth 3 (three) times daily. 12/19/13   Jeralyn BennettEzequiel Zamora, MD  potassium chloride SA (K-DUR,KLOR-CON) 20 MEQ tablet Take 1 tablet (20 mEq total) by mouth once. 12/19/13 12/21/13  Jeralyn BennettEzequiel Zamora, MD  promethazine (PHENERGAN) 25 MG tablet Take 25 mg by mouth every 8 (eight) hours as needed for nausea or vomiting.    Historical Provider, MD  senna (SENOKOT) 8.6 MG TABS tablet Take 2 tablets (17.2 mg total) by mouth 2 (two) times daily. 12/19/13   Jeralyn BennettEzequiel Zamora, MD   BP 123/55  Pulse 103  Temp(Src) 97.8 F (36.6 C) (Oral)  Resp 16  SpO2 99% Physical Exam  Nursing note and vitals reviewed. Constitutional: She appears cachectic.  HENT:  Head: Normocephalic and atraumatic.  Right Ear: External ear normal.  Left Ear: External ear normal.  Nose: Nose normal.  Eyes: Right eye exhibits no discharge. Left eye exhibits no discharge.  Cardiovascular: Normal rate, regular rhythm and normal heart sounds.   Pulmonary/Chest: Effort normal and breath sounds normal. She exhibits tenderness (left lower anterior chest wall pain over ribs.).  Abdominal: Soft. She exhibits no distension. There is no tenderness.  Neurological: She is alert. She is disoriented. GCS eye subscore is 4. GCS verbal subscore is 4. GCS motor subscore is 6.  Moves all 4 extremities normally  Skin: Skin is warm and dry.    ED Course  Procedures (including critical care time) Labs Review Labs Reviewed  CBC WITH DIFFERENTIAL - Abnormal; Notable for the following:    RBC 3.61 (*)    Hemoglobin 11.6 (*)    HCT 34.9 (*)    All other components within normal limits  COMPREHENSIVE METABOLIC PANEL - Abnormal; Notable for the following:    Potassium 3.4 (*)    Glucose, Bld 108 (*)    Albumin 3.1 (*)    Alkaline Phosphatase 167 (*)    GFR calc non Af Amer 61 (*)    GFR calc Af Amer 70 (*)     All other components within normal limits  MRSA PCR SCREENING  URINALYSIS, ROUTINE W REFLEX MICROSCOPIC  AMMONIA  COMPREHENSIVE METABOLIC PANEL  CBC WITH DIFFERENTIAL  TSH  URINE RAPID DRUG SCREEN (HOSP PERFORMED)  CBC  CREATININE, SERUM    Imaging Review Dg Chest 2 View  01/17/2014   CLINICAL DATA:  Unwitnessed fall. Anterior left lower rib pain. Hypertension.  EXAM: CHEST  2 VIEW  COMPARISON:  Multiple exams, including 01/04/2014  FINDINGS: Bony demineralization.  No pneumothorax or pleural effusion.  Vertebral augmentations at the thoracolumbar junction. Atherosclerotic descending thoracic aorta. The patient is rotated to the Right  on today's radiograph, reducing diagnostic sensitivity and specificity. Mild scarring or subsegmental atelectasis peripherally at the right lung base.  IMPRESSION: 1. No pneumothorax or pleural effusion. 2. Atherosclerosis. 3. Vertebral augmentations at the thoracolumbar junction. 4. Bony demineralization. 5. Mild scarring or subsegmental atelectasis at the right lung base.   Electronically Signed   By: Herbie BaltimoreWalt  Liebkemann M.D.   On: 01/17/2014 18:58   Dg Ribs Unilateral Left  01/17/2014   CLINICAL DATA:  Unwitnessed fall with resulting left lower rib pain.  EXAM: LEFT RIBS - 2 VIEW  COMPARISON:  None.  FINDINGS: Bony demineralization. Suspected nondisplaced left eighth rib anteriorly. Possible nondisplaced left tenth rib anteriorly.  IMPRESSION: 1. Suspected nondisplaced fractures of the left anterior eighth and tenth ribs.   Electronically Signed   By: Herbie BaltimoreWalt  Liebkemann M.D.   On: 01/17/2014 19:00   Ct Head Wo Contrast  01/17/2014   CLINICAL DATA:  Fall with altered mental status.  Initial encounter  EXAM: CT HEAD WITHOUT CONTRAST  TECHNIQUE: Contiguous axial images were obtained from the base of the skull through the vertex without intravenous contrast.  COMPARISON:  None.  FINDINGS: Skull and Sinuses:Negative for fracture.  No destructive process.  Opacification  and sclerosis of the left mastoid tip consistent with chronic mastoiditis.  Orbits: No traumatic findings.  Bilateral cataract resection.  Brain: No evidence of acute abnormality, such as acute infarction, hemorrhage, hydrocephalus, or mass lesion/mass effect.  There is generalized cerebral volume loss which is typical for age. Mild chronic vessel disease with ischemic gliosis confluent around the lateral ventricles.  IMPRESSION: No acute intracranial findings.   Electronically Signed   By: Tiburcio PeaJonathan  Watts M.D.   On: 01/17/2014 19:08     EKG Interpretation   Date/Time:  Sunday January 17 2014 18:10:31 EDT Ventricular Rate:  98 PR Interval:  158 QRS Duration: 78 QT Interval:  374 QTC Calculation: 477 R Axis:   91 Text Interpretation:  Normal sinus rhythm Probable lateral infarct, old No  old tracing to compare Confirmed by Kanen Mottola  MD, Caedin Mogan ([id number]) on  01/17/2014 6:17:21 PM      MDM   Final diagnoses:  Fall, initial encounter  Delirium Left rib fractures  Patient presents with acute delirium and left rib fractures. No signs of pneumothorax. No hypoxia. Pain is intermittently controlled. No obvious source for her acute delirium. Could be medication related but there've been no new medicine changes in the last couple days. Due to all this, I feel she warrants admission for further workup of delirium and pain control.    Audree CamelScott T Jayceion Lisenby, MD 01/17/14 [phone]

## 2014-01-17 NOTE — ED Notes (Signed)
Bed: [id number] Expected date: 01/17/14 Expected time: 5:38 PM Means of arrival: Ambulance Comments: "Not acting herself" family wants pt checked out

## 2014-01-17 NOTE — ED Notes (Signed)
Attempted to call report for room 1405, charge nurse is re-assigning the room to 1403. Primary nurse will call back for report.

## 2014-01-17 NOTE — Progress Notes (Signed)
PHARMACIST - PHYSICIAN ORDER COMMUNICATION  CONCERNING: P&T Medication Policy on Herbal Medications  DESCRIPTION:  This patient's order for:  Melatonin  has been noted.  This product(s) is classified as an "herbal" or natural product. Due to a lack of definitive safety studies or FDA approval, nonstandard manufacturing practices, plus the potential risk of unknown drug-drug interactions while on inpatient medications, the Pharmacy and Therapeutics Committee does not permit the use of "herbal" or natural products of this type within [facility].   ACTION TAKEN: The pharmacy department is unable to verify this order at this time and your patient has been informed of this safety policy. Please reevaluate patient's clinical condition at discharge and address if the herbal or natural product(s) should be resumed at that time.   Thanks Lorenza EvangelistGreen, Carlin Mamone R 01/17/2014 10:10 PM

## 2014-01-17 NOTE — ED Notes (Signed)
[facility] In her room on the floor.  She c/o left ribs/back area; and is in no distress.

## 2014-01-17 NOTE — H&P (Signed)
[facility] History and Physical  CLIMMIE CRONCE ZOX:[id number] DOB: 11/08/1920 DOA: 01/17/2014  Referring physician: ER physician. PCP: Pearson Grippe, MD   History obtained from patient's son.  Chief Complaint: Fall and confusion.  HPI: Nancy Meyer is a 80 y.o. female with history of hypertension who was admitted last month for abdominal pain with constipation was brought to the ER after patient was found to be on the floor in a nursing home by patient's family. Patient's son states that patient has been found be increasingly confused since last night. Patient usually uses a walker to walk but last night patient was walking unaided. Today around afternoon when patients daughter went to check on the patient patient was found to be on the floor but not unconscious. In the ER patient was complaining of left-sided chest pain and x-rays show possible fractures of the eighth and 10th rib. Patient also is complaining of mid back pain and patient 2 days ago had kyphoplasty of the T12 vertebrae. On exam patient is alert awake and oriented to her name and is able to recognize her son. Moves all extremities. Patient's son states that he is not aware of any new medications being started.   Review of Systems: As presented in the history of presenting illness, rest negative.  Past Medical History  Diagnosis Date  . Hypertension   . Osteoporosis   . Macular degeneration   . Arthritis   . Hyperlipidemia    Past Surgical History  Procedure Laterality Date  . Appendectomy    . Eye surgery    . Back surgery     Social History:  reports that she has never smoked. She has never used smokeless tobacco. She reports that she does not drink alcohol or use illicit drugs. Where does patient live nursing home. Can patient participate in ADLs? Not sure.  Allergies  Allergen Reactions  . Macrobid [Nitrofurantoin] Itching    Family History:  Family History  Problem Relation Age of Onset   . Cancer Daughter     breast      Prior to Admission medications   Medication Sig Start Date End Date Taking? Authorizing Provider  acetaminophen (TYLENOL) 325 MG tablet Take 2 tablets (650 mg total) by mouth every 6 (six) hours as needed for mild pain, moderate pain, fever or headache. 12/19/13  Yes Jeralyn Bennett, MD  ALPRAZolam Prudy Feeler) 0.25 MG tablet Take 1 tablet (0.25 mg total) by mouth 2 (two) times daily as needed for anxiety. 12/19/13  Yes Jeralyn Bennett, MD  ALPRAZolam Prudy Feeler) 0.25 MG tablet Take 0.25 mg by mouth every evening. At 9pm.   Yes Historical Provider, MD  amoxicillin (AMOXIL) 500 MG capsule Take 500 mg by mouth 2 (two) times daily. For 7 days.   Yes Historical Provider, MD  bisacodyl (DULCOLAX) 5 MG EC tablet Take 5 mg by mouth daily as needed for mild constipation.    Yes Historical Provider, MD  buPROPion (WELLBUTRIN SR) 150 MG 12 hr tablet Take 150 mg by mouth daily.    Yes Historical Provider, MD  Cholecalciferol (VITAMIN D-3) 5000 UNITS TABS Take 1 tablet by mouth daily. For 4 weeks   Yes Historical Provider, MD  Cholecalciferol (VITAMIN D3) 2000 UNITS TABS Take 1 tablet by mouth daily. Start ~ 01/28/14   Yes Historical Provider, MD  Cyanocobalamin (VITAMIN B-12 IJ) Inject 1 mL as directed once a week.   Yes Historical Provider, MD  ENSURE PLUS (ENSURE PLUS) LIQD Take 237 mLs by mouth  2 (two) times daily.   Yes Historical Provider, MD  gabapentin (NEURONTIN) 100 MG capsule Take 100 mg by mouth at bedtime.   Yes Historical Provider, MD  lactobacillus acidophilus (BACID) TABS tablet Take 2 tablets by mouth 2 (two) times daily.   Yes Historical Provider, MD  lactulose (CHRONULAC) 10 GM/15ML solution Take 30 mLs (20 g total) by mouth 2 (two) times daily as needed for moderate constipation. 12/19/13  Yes Jeralyn BennettEzequiel Zamora, MD  lidocaine (LIDODERM) 5 % Place 0.5 patches onto the skin every 12 (twelve) hours. To left SL Joint, and L Mid Buttock   Yes Historical Provider, MD   losartan (COZAAR) 100 MG tablet Take 100 mg by mouth daily.  07/12/11  Yes Historical Provider, MD  lubiprostone (AMITIZA) 24 MCG capsule Take 24 mcg by mouth 2 (two) times daily with a meal.   Yes Historical Provider, MD  Magnesium Hydroxide (MILK OF MAGNESIA PO) Take 30 mLs by mouth daily as needed (constipation).   Yes Historical Provider, MD  Melatonin 3 MG TABS Take 1 tablet by mouth at bedtime.   Yes Historical Provider, MD  morphine (MSIR) 15 MG tablet Take 7.5 mg by mouth 3 (three) times daily. At 6am, 11 am, and 4pm   Yes Historical Provider, MD  Nutritional Supplements (NUTRITIONAL DRINK PO) Take 1 Container by mouth 3 (three) times daily between meals.   Yes Historical Provider, MD  oxycodone (OXY-IR) 5 MG capsule Take 5 mg by mouth every 4 (four) hours as needed for pain.   Yes Historical Provider, MD  promethazine (PHENERGAN) 25 MG tablet Take 25 mg by mouth every 8 (eight) hours as needed for nausea or vomiting.   Yes Historical Provider, MD  protein supplement (PROSOURCE NO CARB) LIQD Take 30 mLs by mouth 2 (two) times daily.   Yes Historical Provider, MD    Physical Exam: Filed Vitals:   01/17/14 1923 01/17/14 2011 01/17/14 2036 01/17/14 2120  BP: 112/96 135/61  144/73  Pulse: 99 91  85  Temp: 98.2 F (36.8 C) 98.1 F (36.7 C) 98.1 F (36.7 C) 97.6 F (36.4 C)  TempSrc: Oral Oral  Oral  Resp:  18  17  Height:    5\' 2"  (1.575 m)  Weight:    38.2 kg (84 lb 3.5 oz)  SpO2: 100% 97%  98%     General:  Moderately built and nourished.  Eyes: Anicteric no pallor.  ENT: No discharge from the ears eyes nose mouth.  Neck: No mass felt.  Cardiovascular: S1-S2 heard.  Respiratory: No rhonchi or crepitations.  Abdomen: Soft nontender bowel sounds present.  Skin: No rash.  Musculoskeletal: Left-sided chest tenderness.  Psychiatric: Patient follows commands and is alert and awake.  Neurologic: Alert awake oriented to her name and follows commands and moves all  extremities.  Labs on Admission:  Basic Metabolic Panel:  Recent Labs Lab 01/15/14 1200 01/17/14 1820  NA 139 144  K 3.2* 3.4*  CL 100 103  CO2 28 28  GLUCOSE 112* 108*  BUN 12 19  CREATININE 0.52 0.82  CALCIUM 8.9 9.5   Liver Function Tests:  Recent Labs Lab 01/17/14 1820  AST 16  ALT 11  ALKPHOS 167*  BILITOT 0.3  PROT 6.1  ALBUMIN 3.1*   No results found for this basename: LIPASE, AMYLASE,  in the last 168 hours No results found for this basename: AMMONIA,  in the last 168 hours CBC:  Recent Labs Lab 01/15/14 1200 01/17/14 1820  WBC  6.0 7.8  NEUTROABS  --  5.8  HGB 11.5* 11.6*  HCT 33.9* 34.9*  MCV 95.5 96.7  PLT 303 346   Cardiac Enzymes: No results found for this basename: CKTOTAL, CKMB, CKMBINDEX, TROPONINI,  in the last 168 hours  BNP (last 3 results) No results found for this basename: PROBNP,  in the last 8760 hours CBG: No results found for this basename: GLUCAP,  in the last 168 hours  Radiological Exams on Admission: Dg Chest 2 View  01/17/2014   CLINICAL DATA:  Unwitnessed fall. Anterior left lower rib pain. Hypertension.  EXAM: CHEST  2 VIEW  COMPARISON:  Multiple exams, including 01/04/2014  FINDINGS: Bony demineralization.  No pneumothorax or pleural effusion.  Vertebral augmentations at the thoracolumbar junction. Atherosclerotic descending thoracic aorta. The patient is rotated to the Right on today's radiograph, reducing diagnostic sensitivity and specificity. Mild scarring or subsegmental atelectasis peripherally at the right lung base.  IMPRESSION: 1. No pneumothorax or pleural effusion. 2. Atherosclerosis. 3. Vertebral augmentations at the thoracolumbar junction. 4. Bony demineralization. 5. Mild scarring or subsegmental atelectasis at the right lung base.   Electronically Signed   By: Herbie Baltimore M.D.   On: 01/17/2014 18:58   Dg Ribs Unilateral Left  01/17/2014   CLINICAL DATA:  Unwitnessed fall with resulting left lower rib  pain.  EXAM: LEFT RIBS - 2 VIEW  COMPARISON:  None.  FINDINGS: Bony demineralization. Suspected nondisplaced left eighth rib anteriorly. Possible nondisplaced left tenth rib anteriorly.  IMPRESSION: 1. Suspected nondisplaced fractures of the left anterior eighth and tenth ribs.   Electronically Signed   By: Herbie Baltimore M.D.   On: 01/17/2014 19:00   Ct Head Wo Contrast  01/17/2014   CLINICAL DATA:  Fall with altered mental status.  Initial encounter  EXAM: CT HEAD WITHOUT CONTRAST  TECHNIQUE: Contiguous axial images were obtained from the base of the skull through the vertex without intravenous contrast.  COMPARISON:  None.  FINDINGS: Skull and Sinuses:Negative for fracture.  No destructive process.  Opacification and sclerosis of the left mastoid tip consistent with chronic mastoiditis.  Orbits: No traumatic findings.  Bilateral cataract resection.  Brain: No evidence of acute abnormality, such as acute infarction, hemorrhage, hydrocephalus, or mass lesion/mass effect.  There is generalized cerebral volume loss which is typical for age. Mild chronic vessel disease with ischemic gliosis confluent around the lateral ventricles.  IMPRESSION: No acute intracranial findings.   Electronically Signed   By: Tiburcio Pea M.D.   On: 01/17/2014 19:08    EKG: Independently reviewed. Normal sinus rhythm with nonspecific ST-T changes in the inferior leads.  Assessment/Plan Principal Problem:   Acute encephalopathy Active Problems:   HTN (hypertension)   Normocytic anemia   Rib fractures   1. Acute encephalopathy - cause is not clear. Since patient's confusion had started happening suddenly we will check MRI brain. Check ammonia levels and patients medications may need to be adjusted. Closely observe mental status. I have discontinued Phenergan for now. 2. Fall with left-sided chest pain and possible left-sided rib fracture - repeat chest x-ray in a.m. to make sure there is no pleural effusion  developing. Since patient is also complaining of mid back pain and patient has had recent kyphoplasty done 2 days ago check T-spine x-rays. If mid back pain persist consul patients interventional radiologist Dr. Corliss Skains. Patient has been placed on when necessary pain medication along with her home medications for pain. Closely follow in telemetry for any arrhythmias. Check troponins. 3.  Hypertension - continue home medications. 4. Chronic anemia - follow CBC. Since patient is confused check B-12 and folate levels.    Code Status: DO NOT RESUSCITATE.  Family Communication: Patient's son at the bedside.  Disposition Plan: Admit to inpatient.    Crew Goren N. [facility] Pager [phone].  If 7PM-7AM, please contact night-coverage [email or web address] Password [email or web address] 01/17/2014, 9:54 PM

## 2014-01-18 ENCOUNTER — Inpatient Hospital Stay (HOSPITAL_COMMUNITY): Payer: Medicare Other

## 2014-01-18 DIAGNOSIS — E876 Hypokalemia: Secondary | ICD-10-CM

## 2014-01-18 LAB — TSH: TSH: 1.51 u[IU]/mL (ref 0.350–4.500)

## 2014-01-18 LAB — CBC WITH DIFFERENTIAL/PLATELET
BASOS ABS: 0 10*3/uL (ref 0.0–0.1)
Basophils Relative: 1 % (ref 0–1)
Eosinophils Absolute: 0.5 10*3/uL (ref 0.0–0.7)
Eosinophils Relative: 8 % — ABNORMAL HIGH (ref 0–5)
HCT: 31.1 % — ABNORMAL LOW (ref 36.0–46.0)
Hemoglobin: 10.7 g/dL — ABNORMAL LOW (ref 12.0–15.0)
LYMPHS PCT: 23 % (ref 12–46)
Lymphs Abs: 1.3 10*3/uL (ref 0.7–4.0)
MCH: 32.4 pg (ref 26.0–34.0)
MCHC: 34.4 g/dL (ref 30.0–36.0)
MCV: 94.2 fL (ref 78.0–100.0)
Monocytes Absolute: 0.7 10*3/uL (ref 0.1–1.0)
Monocytes Relative: 11 % (ref 3–12)
NEUTROS ABS: 3.4 10*3/uL (ref 1.7–7.7)
NEUTROS PCT: 57 % (ref 43–77)
PLATELETS: 302 10*3/uL (ref 150–400)
RBC: 3.3 MIL/uL — ABNORMAL LOW (ref 3.87–5.11)
RDW: 13.2 % (ref 11.5–15.5)
WBC: 6 10*3/uL (ref 4.0–10.5)

## 2014-01-18 LAB — GLUCOSE, CAPILLARY: Glucose-Capillary: 97 mg/dL (ref 70–99)

## 2014-01-18 LAB — RAPID URINE DRUG SCREEN, HOSP PERFORMED
Amphetamines: NOT DETECTED
BENZODIAZEPINES: NOT DETECTED
Barbiturates: NOT DETECTED
Cocaine: NOT DETECTED
OPIATES: POSITIVE — AB
Tetrahydrocannabinol: NOT DETECTED

## 2014-01-18 LAB — COMPREHENSIVE METABOLIC PANEL
ALT: 10 U/L (ref 0–35)
ANION GAP: 12 (ref 5–15)
AST: 16 U/L (ref 0–37)
Albumin: 2.8 g/dL — ABNORMAL LOW (ref 3.5–5.2)
Alkaline Phosphatase: 151 U/L — ABNORMAL HIGH (ref 39–117)
BILIRUBIN TOTAL: 0.3 mg/dL (ref 0.3–1.2)
BUN: 13 mg/dL (ref 6–23)
CALCIUM: 8.4 mg/dL (ref 8.4–10.5)
CHLORIDE: 101 meq/L (ref 96–112)
CO2: 26 mEq/L (ref 19–32)
CREATININE: 0.54 mg/dL (ref 0.50–1.10)
GFR calc non Af Amer: 80 mL/min — ABNORMAL LOW (ref >90)
Glucose, Bld: 106 mg/dL — ABNORMAL HIGH (ref 70–99)
Potassium: 2.9 mEq/L — CL (ref 3.7–5.3)
Sodium: 139 mEq/L (ref 137–147)
Total Protein: 5.4 g/dL — ABNORMAL LOW (ref 6.0–8.3)

## 2014-01-18 LAB — VITAMIN B12: Vitamin B-12: 1392 pg/mL — ABNORMAL HIGH (ref 211–911)

## 2014-01-18 LAB — TROPONIN I
Troponin I: <0.3 ng/mL (ref <0.30)
Troponin I: <0.3 ng/mL (ref <0.30)

## 2014-01-18 LAB — FOLATE: Folate: 15.7 ng/mL

## 2014-01-18 MED ORDER — KETOROLAC TROMETHAMINE 15 MG/ML IJ SOLN
15.0000 mg | Freq: Four times a day (QID) | INTRAMUSCULAR | Status: DC | PRN
  Administered 2014-01-19: 15 mg via INTRAVENOUS
  Filled 2014-01-18: qty 1

## 2014-01-18 MED ORDER — TRAMADOL-ACETAMINOPHEN 37.5-325 MG PO TABS
2.0000 | ORAL_TABLET | Freq: Three times a day (TID) | ORAL | Status: DC
  Administered 2014-01-18 – 2014-01-19 (×3): 2 via ORAL
  Filled 2014-01-18 (×3): qty 2

## 2014-01-18 MED ORDER — POTASSIUM CHLORIDE 10 MEQ/100ML IV SOLN
10.0000 meq | INTRAVENOUS | Status: AC
  Administered 2014-01-18 (×4): 10 meq via INTRAVENOUS
  Filled 2014-01-18 (×4): qty 100

## 2014-01-18 MED ORDER — LIDOCAINE 5 % EX PTCH
1.0000 | MEDICATED_PATCH | CUTANEOUS | Status: DC
  Administered 2014-01-18: 1 via TRANSDERMAL
  Filled 2014-01-18: qty 1

## 2014-01-18 NOTE — Progress Notes (Signed)
Clinical Social Work Department BRIEF PSYCHOSOCIAL ASSESSMENT 01/18/2014  Patient:  Nancy Meyer, Nancy Meyer     Account Number:  [id number]     Admit date:  01/17/2014  Clinical Social Worker:  Earlie Server  Date/Time:  01/18/2014 02:00 PM  Referred by:  Physician  Date Referred:  01/18/2014 Referred for  SNF Placement   Other Referral:   Interview type:  Patient Other interview type:    PSYCHOSOCIAL DATA Living Status:  FACILITY Admitted from facility:  [facility] Level of care:  [place] Primary support name:  [name] Primary support relationship to patient:  CHILD, ADULT Degree of support available:   Strong    CURRENT CONCERNS Current Concerns  Post-Acute Placement   Other Concerns:    SOCIAL WORK ASSESSMENT / PLAN CSW received referral due to patient being admitted from a facility. CSW reviewed chart and met with patient, son, dtr, and son-in-law at bedside. CSW introduced myself and explained role.    Patient reports that she was at [facility] for re[facility] but she does not need rehab at DC. CSW reviewed PT note which continues to recommend SNF at DC. Patient was living home alone prior to admitting to [facility] and family continues to feel that patient needs SNF placement. Patient reports that she wants to know all of her options but wants to DC back home. Patient reports she feels forced to have an answer today so CSW agreeable to follow up tomorrow so that she can family can discuss options.    CSW completed FL2 and will continue to follow to assist with DC planning.   Assessment/plan status:  Psychosocial Support/Ongoing Assessment of Needs Other assessment/ plan:   Information/referral to community resources:   SNF vs [facility]    PATIENT'S/FAMILY'S RESPONSE TO PLAN OF CARE: Patient alert and oriented. Patient reports she does not want to feel that family is making all of her decisions for her. Patient knows she is weak but reports she always  feels more comfortable at home. Patient agreeable to consider all options and to talk with family about what is going to be best for her at DC. Patient agreeable for CSW to continue to follow.       Sindy Messing, LCSW (Coverage for [employer])

## 2014-01-18 NOTE — Evaluation (Signed)
Physical Therapy Evaluation Patient Details Name: Nancy JewelBarbara P Klawitter MRN: [id number] DOB: 1920-11-22 Today's Date: 01/18/2014   History of Present Illness  Nancy Meyer is a 80 y.o. female with history of hypertension who was admitted last month for abdominal pain with constipation was brought to the ER after patient was found to be on the floor in a nursing home by patient's family. Patient's son states that patient has been found be increasingly confused.  Clinical Impression  **Pt admitted with fall, confusion**. Pt currently with functional limitations due to the deficits listed below (see PT Problem List). * Pt will benefit from skilled PT to increase their independence and safety with mobility to allow discharge to the venue listed below.   Pt requires min to mod assist for mobility. She walked 6214' with min assist and RW. She required frequent cues for safety. 24* assist recommended at SNF level. She is at high risk for falls.   *    Follow Up Recommendations SNF    Equipment Recommendations  Rolling walker with 5" wheels    Recommendations for Other Services       Precautions / Restrictions Precautions Precautions: Fall Restrictions Weight Bearing Restrictions: No      Mobility  Bed Mobility               General bed mobility comments: pt up on EOB  Transfers Overall transfer level: Needs assistance Equipment used: None Transfers: Sit to/from Stand;Stand Pivot Transfers Sit to Stand: Mod assist Stand pivot transfers: Min assist       General transfer comment: cues for hand placement, assist to rise and steady  Ambulation/Gait Ambulation/Gait assistance: Min assist Ambulation Distance (Feet): 14 Feet Assistive device: Rolling walker (2 wheeled) Gait Pattern/deviations: Step-to pattern;Trunk flexed;Decreased step length - left;Decreased step length - right     General Gait Details: profoundly kyphotic in standing, min assist and verbal cues for  management of RW and for positioning in RW, cues for safety  Stairs            Wheelchair Mobility    Modified Rankin (Stroke Patients Only)       Balance Overall balance assessment: Needs assistance   Sitting balance-Leahy Scale: Good       Standing balance-Leahy Scale: Poor                               Pertinent Vitals/Pain Pain Assessment: No/denies pain    Home Living Family/patient expects to be discharged to:: Skilled nursing facility                      Prior Function           Comments: per chart pt was independent prior to previous back surgery, pt is poor historian, unable to provide prior functional level     Hand Dominance        Extremity/Trunk Assessment   Upper Extremity Assessment: Overall WFL for tasks assessed           Lower Extremity Assessment: Overall WFL for tasks assessed      Cervical / Trunk Assessment: Kyphotic  Communication   Communication: No difficulties  Cognition Arousal/Alertness: Awake/alert Behavior During Therapy: WFL for tasks assessed/performed Overall Cognitive Status: No family/caregiver present to determine baseline cognitive functioning (oriented to self and to month/year, not to location nor situation)       Memory: Decreased short-term memory  General Comments      Exercises        Assessment/Plan    PT Assessment Patient needs continued PT services  PT Diagnosis Difficulty walking;Generalized weakness   PT Problem List Decreased activity tolerance;Decreased balance;Decreased knowledge of use of DME;Decreased cognition;Decreased mobility  PT Treatment Interventions Gait training;DME instruction;Functional mobility training;Therapeutic activities;Patient/family education;Balance training;Therapeutic exercise   PT Goals (Current goals can be found in the Care Plan section) Acute Rehab PT Goals Patient Stated Goal: to get stronger PT Goal  Formulation: With patient Time For Goal Achievement: 02/01/14 Potential to Achieve Goals: Fair    Frequency Min 3X/week   Barriers to discharge        Co-evaluation               End of Session Equipment Utilized During Treatment: Gait belt Activity Tolerance: Patient limited by fatigue Patient left: in chair;with call bell/phone within reach;with chair alarm set Nurse Communication: Mobility status         Time: [id number] PT Time Calculation (min): 30 min   Charges:   PT Evaluation $Initial PT Evaluation Tier I: 1 Procedure PT Treatments $Gait Training: 8-22 mins $Therapeutic Activity: 8-22 mins   PT G Codes:          Tamala SerUhlenberg, Jlee Harkless Kistler 01/18/2014, 9:13 AM [phone]

## 2014-01-18 NOTE — Progress Notes (Addendum)
[facility] PROGRESS NOTE  Nancy JewelBarbara P Meyer ZOX:[id number] DOB: 02/09/1921 DOA: 01/17/2014 PCP: Pearson GrippeKIM, JAMES, MD  Brief narrative  80 y/o female with recent T12 vertibroplasty , [facility] resident brought to ED after a non witnessed fall. Patient confused as well. W/up unremarkable for any injury except for left 8th-10th ribs.  Assessment/Plan: Acute encephalopathy  likely in the setting of narcotics. -MRI brain negative. No signs of infection. -Pt on MSIR and oxycodone at home which likely contributed to her confusion. Will d/c MSIR and continue oxycodone. Will reduce xanax to once day only ( was getting 3 times a day) -add scheduled ultracet q8hrs and prn toradol 15 mg q 6hr while she is here. Add lidocaine patch to the back and left ribs. continue bowel regimen. -mental status at baseline.  Fall with Back pain and left rib pain Recent T12 kyphoplasty. Looks stable on x ray today. Has left 8-10th rib fracture possibly from fall. Ordered lidoderm patch and bedside spirometry. PT eval  Hypokalemia  replenished  recheck in am  Hypertension  - continue home medications.   Chronic anemia  Stable  Protein calorie malnutrition:  continue supplements   DVT prophylaxis:   Diet:  Heart healthy   Code Status: DNR Family Communication: son at bedside  Disposition Plan:SNF possibly on 10/27   Consultants:  none  Procedures:  MRI brain  Xray chest and thoracic vertebrae  Antibiotics:  none  HPI/Subjective: Pt alert and oriented this am. C/o pain in her mid back on bending and pain in her left lower ribs  Objective: Filed Vitals:   01/18/14 1317  BP: 175/80  Pulse: 103  Temp: 97.4 F (36.3 C)  Resp: 20    Intake/Output Summary (Last 24 hours) at 01/18/14 1715 Last data filed at 01/18/14 1500  Gross per 24 hour  Intake 770.83 ml  Output    400 ml  Net 370.83 ml   Filed Weights   01/17/14 2120  Weight: 38.2 kg (84 lb 3.5 oz)    Exam:   General:   Elderly thin built female in NAD  HEENT: moist mucosa  Cardiovascular: NS1&S2, no murmurs  Respiratory: clear b/l, tender to pressure over lower  left lateral ribs  Abdomen: soft, NT, ND, BS+  Musculoskeletal: warm, tenderness over mid back  CNS: alert and  oreinted  Data Reviewed: Basic Metabolic Panel:  Recent Labs Lab 01/15/14 1200 01/17/14 1820 01/17/14 2242 01/18/14 0417  NA 139 144  --  139  K 3.2* 3.4*  --  2.9*  CL 100 103  --  101  CO2 28 28  --  26  GLUCOSE 112* 108*  --  106*  BUN 12 19  --  13  CREATININE 0.52 0.82 0.65 0.54  CALCIUM 8.9 9.5  --  8.4   Liver Function Tests:  Recent Labs Lab 01/17/14 1820 01/18/14 0417  AST 16 16  ALT 11 10  ALKPHOS 167* 151*  BILITOT 0.3 0.3  PROT 6.1 5.4*  ALBUMIN 3.1* 2.8*   No results found for this basename: LIPASE, AMYLASE,  in the last 168 hours  Recent Labs Lab 01/17/14 2242  AMMONIA 22   CBC:  Recent Labs Lab 01/15/14 1200 01/17/14 1820 01/17/14 2242 01/18/14 0417  WBC 6.0 7.8 7.2 6.0  NEUTROABS  --  5.8  --  3.4  HGB 11.5* 11.6* 10.7* 10.7*  HCT 33.9* 34.9* 32.3* 31.1*  MCV 95.5 96.7 95.6 94.2  PLT 303 346 315 302   Cardiac Enzymes:  Recent Labs Lab 01/18/14 0417 01/18/14 0950 01/18/14 1601  TROPONINI <0.30 <0.30 <0.30   BNP (last 3 results) No results found for this basename: PROBNP,  in the last 8760 hours CBG:  Recent Labs Lab 01/18/14 0752  GLUCAP 97    Recent Results (from the past 240 hour(s))  MRSA PCR SCREENING     Status: None   Collection Time    01/17/14  9:41 PM      Result Value Ref Range Status   MRSA by PCR NEGATIVE  NEGATIVE Final   Comment:            The GeneXpert MRSA Assay (FDA     approved for NASAL specimens     only), is one component of a     comprehensive MRSA colonization     surveillance program. It is not     intended to diagnose MRSA     infection nor to guide or     monitor treatment for     MRSA infections.     Studies: Dg Chest  2 View  01/17/2014   CLINICAL DATA:  Unwitnessed fall. Anterior left lower rib pain. Hypertension.  EXAM: CHEST  2 VIEW  COMPARISON:  Multiple exams, including 01/04/2014  FINDINGS: Bony demineralization.  No pneumothorax or pleural effusion.  Vertebral augmentations at the thoracolumbar junction. Atherosclerotic descending thoracic aorta. The patient is rotated to the Right on today's radiograph, reducing diagnostic sensitivity and specificity. Mild scarring or subsegmental atelectasis peripherally at the right lung base.  IMPRESSION: 1. No pneumothorax or pleural effusion. 2. Atherosclerosis. 3. Vertebral augmentations at the thoracolumbar junction. 4. Bony demineralization. 5. Mild scarring or subsegmental atelectasis at the right lung base.   Electronically Signed   By: Herbie BaltimoreWalt  Liebkemann M.D.   On: 01/17/2014 18:58   Dg Ribs Unilateral Left  01/17/2014   CLINICAL DATA:  Unwitnessed fall with resulting left lower rib pain.  EXAM: LEFT RIBS - 2 VIEW  COMPARISON:  None.  FINDINGS: Bony demineralization. Suspected nondisplaced left eighth rib anteriorly. Possible nondisplaced left tenth rib anteriorly.  IMPRESSION: 1. Suspected nondisplaced fractures of the left anterior eighth and tenth ribs.   Electronically Signed   By: Herbie BaltimoreWalt  Liebkemann M.D.   On: 01/17/2014 19:00   Dg Thoracic Spine 2 View  01/18/2014   CLINICAL DATA:  Pain all over. No known injury. Initial evaluation .  EXAM: THORACIC SPINE - 2 VIEW  COMPARISON:  Chest x-ray 01/17/2014 .  FINDINGS: Diffuse osteopenia and degenerative change. Vertebroplasty lower vertebral bodies appears stable. No new significant compression.  IMPRESSION: 1. Diffuse osteopenia degenerative change. 2. Prior vertebroplasties lower thoracic vertebral bodies, appear stable. No new significant vertebral body fracture.   Electronically Signed   By: Maisie Fushomas  Register   On: 01/18/2014 12:15   Ct Head Wo Contrast  01/17/2014   CLINICAL DATA:  Fall with altered mental  status.  Initial encounter  EXAM: CT HEAD WITHOUT CONTRAST  TECHNIQUE: Contiguous axial images were obtained from the base of the skull through the vertex without intravenous contrast.  COMPARISON:  None.  FINDINGS: Skull and Sinuses:Negative for fracture.  No destructive process.  Opacification and sclerosis of the left mastoid tip consistent with chronic mastoiditis.  Orbits: No traumatic findings.  Bilateral cataract resection.  Brain: No evidence of acute abnormality, such as acute infarction, hemorrhage, hydrocephalus, or mass lesion/mass effect.  There is generalized cerebral volume loss which is typical for age. Mild chronic vessel disease with ischemic gliosis confluent around the  lateral ventricles.  IMPRESSION: No acute intracranial findings.   Electronically Signed   By: Tiburcio Pea M.D.   On: 01/17/2014 19:08   Mri Brain Without Contrast  01/18/2014   CLINICAL DATA:  Acute encephalopathy, with recent fall. Sudden onset of mental status changes. History of hypertension.  EXAM: MRI HEAD WITHOUT CONTRAST  TECHNIQUE: Multiplanar, multiecho pulse sequences of the brain and surrounding structures were obtained without intravenous contrast.  COMPARISON:  CT head 01/17/2014.  FINDINGS: The examination is motion degraded and was truncated prematurely due to patient request.  No restricted diffusion is evident. Generalized atrophy is observed. There is no midline abnormality on sagittal T1 weighted images. Gradient sequence unremarkable for intracranial foci of acute or chronic hemorrhage.  No other pulse sequences were obtained.  IMPRESSION: Prematurely truncated examination demonstrating no obvious acute intracranial abnormality.  Good general agreement with prior recent CT.   Electronically Signed   By: Davonna Belling M.D.   On: 01/18/2014 11:51   Dg Chest Port 1 View  01/18/2014   CLINICAL DATA:  Chest pain  EXAM: PORTABLE CHEST - 1 VIEW  COMPARISON:  01/17/2014  FINDINGS: Cardiac shadow is stable.  The lungs are again well aerated. Minimal atelectasis/scarring is noted in the right lung base. No focal infiltrate or sizable effusion is seen. No pneumothorax is noted. Changes of prior vertebral augmentation are again seen.  IMPRESSION: No acute abnormality noted.  No change from the prior exam.   Electronically Signed   By: Alcide Clever M.D.   On: 01/18/2014 07:58    Scheduled Meds: . acidophilus  2 capsule Oral BID  . ALPRAZolam  0.25 mg Oral QPM  . buPROPion  150 mg Oral Daily  . [START ON 01/28/2014] cholecalciferol  2,000 Units Oral Daily  . cholecalciferol  5,000 Units Oral Daily  . enoxaparin (LOVENOX) injection  30 mg Subcutaneous QHS  . feeding supplement (ENSURE COMPLETE)  237 mL Oral BID  . feeding supplement (PRO-STAT SUGAR FREE 64)  30 mL Oral BID  . gabapentin  100 mg Oral QHS  . lidocaine  0.5 patch Transdermal Q24H  . losartan  100 mg Oral Daily  . lubiprostone  24 mcg Oral BID WC  . morphine  7.5 mg Oral TID   Continuous Infusions: . sodium chloride 50 mL/hr at 01/17/14 2211      Time spent: 25 minutes    Woodford Strege  [facility] Pager [phone]. If 7PM-7AM, please contact night-coverage at [email or web address], password [facility] 01/18/2014, 5:15 PM  LOS: 1 day

## 2014-01-18 NOTE — Progress Notes (Signed)
CRITICAL VALUE ALERT  Critical value received:  Potassium 2.9   Date of notification:  01/18/2014  Time of notification:  0518  Critical value read back:Yes.    Nurse who received alert:  J.Imaad Reuss  MD notified (1st page):  K.Schorr  Time of first page:  0520  MD notified (2nd page):  Time of second page:  Responding MD:  K.SChorr  Time MD responded:  0530

## 2014-01-18 NOTE — Care Management Note (Addendum)
    Page 1 of 1   01/19/2014     12:31:31 PM CARE MANAGEMENT NOTE 01/19/2014  Patient:  Nancy Meyer,Nancy Meyer   Account Number:  [id number]  Date Initiated:  01/18/2014  Documentation initiated by:  Lanier ClamMAHABIR,Mahkayla Preece  Subjective/Objective Assessment:   80 Y/O F ADMITTED W/FALL, CONFUSION.ACUTE ENCEPHALOPATHY.HX:S/Meyer KYPHOPLASTY 2 DAYS AGO.     Action/Plan:   FROM [facility].   Anticipated DC Date:  01/19/2014   Anticipated DC Plan:  SKILLED NURSING FACILITY      DC Planning Services  CM consult      Choice offered to / List presented to:             Status of service:  Completed, signed off Medicare Important Message given?   (If response is "NO", the following Medicare IM given date fields will be blank) Date Medicare IM given:   Medicare IM given by:   Date Additional Medicare IM given:   Additional Medicare IM given by:    Discharge Disposition:  SKILLED NURSING FACILITY  Per UR Regulation:  Reviewed for med. necessity/level of care/duration of stay  If discussed at Long Length of Stay Meetings, dates discussed:    Comments:  01/19/14 Shelia Magallon RN,BSN NCM [phone] D/C BACK TO SNF.  01/18/14 Donnie Gedeon RN,BSN NCM [phone] PT-SNF.CSW FOLLOWING FOR SNF.

## 2014-01-19 DIAGNOSIS — F329 Major depressive disorder, single episode, unspecified: Secondary | ICD-10-CM

## 2014-01-19 LAB — BASIC METABOLIC PANEL
Anion gap: 12 (ref 5–15)
BUN: 10 mg/dL (ref 6–23)
CALCIUM: 8.7 mg/dL (ref 8.4–10.5)
CO2: 24 mEq/L (ref 19–32)
Chloride: 101 mEq/L (ref 96–112)
Creatinine, Ser: 0.5 mg/dL (ref 0.50–1.10)
GFR calc Af Amer: >90 mL/min (ref >90)
GFR calc non Af Amer: 82 mL/min — ABNORMAL LOW (ref >90)
GLUCOSE: 103 mg/dL — AB (ref 70–99)
Potassium: 3.4 mEq/L — ABNORMAL LOW (ref 3.7–5.3)
Sodium: 137 mEq/L (ref 137–147)

## 2014-01-19 LAB — GLUCOSE, CAPILLARY
GLUCOSE-CAPILLARY: 153 mg/dL — AB (ref 70–99)
Glucose-Capillary: 121 mg/dL — ABNORMAL HIGH (ref 70–99)
Glucose-Capillary: 143 mg/dL — ABNORMAL HIGH (ref 70–99)

## 2014-01-19 MED ORDER — OXYCODONE HCL 5 MG PO TABS: 5.0000 mg | ORAL_TABLET | ORAL | Status: DC | PRN

## 2014-01-19 MED ORDER — TRAMADOL-ACETAMINOPHEN 37.5-325 MG PO TABS: 2.0000 | ORAL_TABLET | Freq: Three times a day (TID) | ORAL | Status: DC

## 2014-01-19 MED ORDER — LIDOCAINE 5 % EX PTCH: 1.0000 | MEDICATED_PATCH | CUTANEOUS | Status: DC

## 2014-01-19 MED ORDER — HYDROMORPHONE HCL 1 MG/ML IJ SOLN
0.5000 mg | Freq: Once | INTRAMUSCULAR | Status: AC
  Administered 2014-01-19: 0.5 mg via INTRAVENOUS
  Filled 2014-01-19: qty 1

## 2014-01-19 MED ORDER — ALPRAZOLAM 0.25 MG PO TABS
0.2500 mg | ORAL_TABLET | Freq: Every evening | ORAL | Status: DC
Start: 2014-01-19 — End: 2016-08-27

## 2014-01-19 NOTE — Progress Notes (Signed)
Clinical Social Work  CSW met with patient at bedside in order to discuss DC plans. Patient continues to report that she wants to go home but encouraged CSW to speak with her son [name]) who will make final decision. CSW called son and left a message with son asking him to return CSW's call since patient has been DC and plans need to be finalized. CSW will continue to follow.  Sindy Messing, LCSW (Coverage for [employer])

## 2014-01-19 NOTE — Progress Notes (Signed)
Clinical Social Work  CS[facility]. CSW prepared DC packet with DNR, FL2, DC summary and hard scripts included. RN will call report to SNF at DC. [facility] will [facility] CSW back once clinicals have been reviewed for authorization. CSW went to room to update patient and family at bedside. Family will transport patient at DC back to SNF. CSW will continue to follow.  Unk LightningHolly Mellony Danziger, LCSW (Coverage for [employer])

## 2014-01-19 NOTE — Progress Notes (Signed)
Called report to the admitting nurse at skilled nursing facility.  Answered all questions and faxed over report of medications given here at the hospital.  Discharge packet given to family and all questions answered.  IV taken out, vital signs stable.  Pt wheeled out by NT; family is transporting patient to the facility.

## 2014-01-19 NOTE — Progress Notes (Signed)
Clinical Social Work  CSW spoke with son who reports [facility]. CSW faxed DC summary to SNF who is agreeable to accept once insurance authorization is received. CSW faxed clinicals to insurance company and will await to [facility]. CSW will continue to follow.  Unk LightningHolly Osmin Welz, LCSW (Coverage for [employer])

## 2014-01-19 NOTE — Discharge Summary (Signed)
Physician Discharge Summary  Nancy Meyer ZOX:[id number] DOB: Jul 11, 1920 DOA: 01/17/2014  PCP: Pearson Grippe, MD  Admit date: 01/17/2014 Discharge date: 01/19/2014  Time spent: 35 minutes  Recommendations for Outpatient Follow-up:  1. Follow up with pcp in 1-2 weeks  Discharge Diagnoses:  Principal Problem:   Acute encephalopathy Active Problems:   HTN (hypertension)   Normocytic anemia   Rib fractures   Discharge Condition: Improved  Diet recommendation: Heart healthy  Filed Weights   01/17/14 2120  Weight: 38.2 kg (84 lb 3.5 oz)    History of present illness:  Please see admit h and p from 10/25 for details. Briefly, pt presented with increased confusion and fall. Pt was admitted for work up for acute encephalopathy.  Hospital Course:  Acute encephalopathy  likely in the setting of narcotics. -MRI brain negative. No signs of infection.  -Pt on MSIR and oxycodone at home which likely contributed to her confusion.  -D/c'd MSIR and continued oxycodone. Reduced xanax to once day only ( was getting 3 times a day)  -added scheduled ultracet q8hrs and prn toradol 15 mg q 6hr. Added lidocaine patch to the back and left ribs. continue bowel regimen.  -mental status at baseline.  Fall with Back pain and left rib pain  Recent T12 kyphoplasty. Looks stable on x ray today. Has left 8-10th rib fracture possibly from fall. Ordered lidoderm patch and bedside spirometry.  PT eval  Hypokalemia  replenished  recheck in am  Hypertension  - continue home medications.  Chronic anemia  Stable  Protein calorie malnutrition:  continue supplements  Discharge Exam: Filed Vitals:   01/18/14 0530 01/18/14 1317 01/18/14 2110 01/19/14 0512  BP: 147/65 175/80 149/75 143/67  Pulse: 80 103 80 86  Temp: 98.4 F (36.9 C) 97.4 F (36.3 C) 97.5 F (36.4 C) 97.3 F (36.3 C)  TempSrc: Oral Oral Oral Oral  Resp: 20 20 19 20   Height:      Weight:      SpO2: 98% 100% 98% 98%     General: Awake, in nad Cardiovascular: regular, s1, s2 Respiratory: normal resp effort, no wheezing  Discharge Instructions     Medication List    STOP taking these medications       amoxicillin 500 MG capsule  Commonly known as:  AMOXIL     morphine 15 MG tablet  Commonly known as:  MSIR     promethazine 25 MG tablet  Commonly known as:  PHENERGAN      TAKE these medications       acetaminophen 325 MG tablet  Commonly known as:  TYLENOL  Take 2 tablets (650 mg total) by mouth every 6 (six) hours as needed for mild pain, moderate pain, fever or headache.     ALPRAZolam 0.25 MG tablet  Commonly known as:  XANAX  Take 0.25 mg by mouth every evening. At 9pm.     bisacodyl 5 MG EC tablet  Commonly known as:  DULCOLAX  Take 5 mg by mouth daily as needed for mild constipation.     buPROPion 150 MG 12 hr tablet  Commonly known as:  WELLBUTRIN SR  Take 150 mg by mouth daily.     gabapentin 100 MG capsule  Commonly known as:  NEURONTIN  Take 100 mg by mouth at bedtime.     lactobacillus acidophilus Tabs tablet  Take 2 tablets by mouth 2 (two) times daily.     lactulose 10 GM/15ML solution  Commonly known as:  CHRONULAC  Take 30 mLs (20 g total) by mouth 2 (two) times daily as needed for moderate constipation.     lidocaine 5 %  Commonly known as:  LIDODERM  Place 0.5 patches onto the skin every 12 (twelve) hours. To left SL Joint, and L Mid Buttock     losartan 100 MG tablet  Commonly known as:  COZAAR  Take 100 mg by mouth daily.     lubiprostone 24 MCG capsule  Commonly known as:  AMITIZA  Take 24 mcg by mouth 2 (two) times daily with a meal.     Melatonin 3 MG Tabs  Take 1 tablet by mouth at bedtime.     MILK OF MAGNESIA PO  Take 30 mLs by mouth daily as needed (constipation).     NUTRITIONAL DRINK PO  Take 1 Container by mouth 3 (three) times daily between meals.     protein supplement Liqd  Take 30 mLs by mouth 2 (two) times daily.      ENSURE PLUS Liqd  Take 237 mLs by mouth 2 (two) times daily.     oxycodone 5 MG capsule  Commonly known as:  OXY-IR  Take 5 mg by mouth every 4 (four) hours as needed for pain.     traMADol-acetaminophen 37.5-325 MG per tablet  Commonly known as:  ULTRACET  Take 2 tablets by mouth every 8 (eight) hours.     VITAMIN B-12 IJ  Inject 1 mL as directed once a week.     Vitamin D-3 5000 UNITS Tabs  Take 1 tablet by mouth daily. For 4 weeks     Vitamin D3 2000 UNITS Tabs  Take 1 tablet by mouth daily. Start ~ 01/28/14       Allergies  Allergen Reactions  . Macrobid [Nitrofurantoin] Itching      The results of significant diagnostics from this hospitalization (including imaging, microbiology, ancillary and laboratory) are listed below for reference.    Significant Diagnostic Studies: Dg Chest 2 View  01/17/2014   CLINICAL DATA:  Unwitnessed fall. Anterior left lower rib pain. Hypertension.  EXAM: CHEST  2 VIEW  COMPARISON:  Multiple exams, including 01/04/2014  FINDINGS: Bony demineralization.  No pneumothorax or pleural effusion.  Vertebral augmentations at the thoracolumbar junction. Atherosclerotic descending thoracic aorta. The patient is rotated to the Right on today's radiograph, reducing diagnostic sensitivity and specificity. Mild scarring or subsegmental atelectasis peripherally at the right lung base.  IMPRESSION: 1. No pneumothorax or pleural effusion. 2. Atherosclerosis. 3. Vertebral augmentations at the thoracolumbar junction. 4. Bony demineralization. 5. Mild scarring or subsegmental atelectasis at the right lung base.   Electronically Signed   By: Herbie Baltimore M.D.   On: 01/17/2014 18:58   Dg Ribs Unilateral Left  01/17/2014   CLINICAL DATA:  Unwitnessed fall with resulting left lower rib pain.  EXAM: LEFT RIBS - 2 VIEW  COMPARISON:  None.  FINDINGS: Bony demineralization. Suspected nondisplaced left eighth rib anteriorly. Possible nondisplaced left tenth rib  anteriorly.  IMPRESSION: 1. Suspected nondisplaced fractures of the left anterior eighth and tenth ribs.   Electronically Signed   By: Herbie Baltimore M.D.   On: 01/17/2014 19:00   Dg Thoracic Spine 2 View  01/18/2014   CLINICAL DATA:  Pain all over. No known injury. Initial evaluation .  EXAM: THORACIC SPINE - 2 VIEW  COMPARISON:  Chest x-ray 01/17/2014 .  FINDINGS: Diffuse osteopenia and degenerative change. Vertebroplasty lower vertebral bodies appears stable. No new significant compression.  IMPRESSION:  1. Diffuse osteopenia degenerative change. 2. Prior vertebroplasties lower thoracic vertebral bodies, appear stable. No new significant vertebral body fracture.   Electronically Signed   By: Maisie Fus  Register   On: 01/18/2014 12:15   Dg Thoracic Spine 2 View  01/04/2014   CLINICAL DATA:  Low back pain.  History of compression fracture.  EXAM: THORACIC SPINE - 2 VIEW  COMPARISON:  CT abdomen and pelvis 12/14/2013.  FINDINGS: Since prior examination, the patient has undergone vertebral augmentation for a compression fracture of L1. There is a new mild biconcave compression fracture deformity of T12 with vertebral body height loss estimated at 20%. No other fracture is identified.  IMPRESSION: Findings consistent with acute or subacute biconcave compression fracture of T12 with vertebral body height loss estimated at 20%.  Status post vertebral augmentation for fracture of L1.   Electronically Signed   By: Drusilla Kanner M.D.   On: 01/04/2014 18:01   Dg Lumbar Spine 2-3 Views  01/04/2014   CLINICAL DATA:  Low back pain.  No known injury.  EXAM: LUMBAR SPINE - 2-3 VIEW  COMPARISON:  11/02/2013; CT abdomen pelvis- 12/18/2013; lumbar spine MRI - 11/03/2013  FINDINGS: There are 5 non rib-bearing lumbar type vertebral bodies.  Re- demonstrated moderate scoliotic curvature of the mid thoracic spine, convex to the left, centered about the L3-L4 articulation.  The patient has undergone interval vertebroplasty/  kyphoplasty of the L2 vertebral body.  Stable vertebroplasty/ kyphoplasty of the L4 vertebral body.  Interval development of mild (approximately 25%) compression deformity involving the inferior endplate of the T12 vertebral body.  Unchanged mild (approximately 25%) compression deformity involving the inferior endplate of the L3 vertebral body. Remaining vertebral body heights appear preserved given obliquity.  There is grossly unchanged moderate to severe multilevel lumbar spine DDD, worse at L3-L4 and to a lesser extent, L2-L3 and L4-L5 with disc space height loss, endplate irregularity and sclerosis.  Atherosclerotic plaque within the abdominal aorta. Regional soft tissues appear otherwise normal.  IMPRESSION: 1. Interval development of a mild (approximately 25%) compression deformity involving the inferior endplate of the T12 vertebral body. Correlation for point tenderness at this location is recommended. Further evaluation could be performed with lumbar spine MRI as clinically indicated. 2. Interval vertebroplasty/kyphoplasty of the L1 vertebral body. 3. Unchanged vertebroplasty/kyphoplasty of the L4 vertebral body. 4. Unchanged focal scoliotic curvature of the lumbar spine, convex to the left, centered about the L3-L4 articulation. 5. Grossly unchanged moderate severe multilevel lumbar spine DDD, worse at L3-L4.   Electronically Signed   By: Simonne Come M.D.   On: 01/04/2014 18:03   Ct Head Wo Contrast  01/17/2014   CLINICAL DATA:  Fall with altered mental status.  Initial encounter  EXAM: CT HEAD WITHOUT CONTRAST  TECHNIQUE: Contiguous axial images were obtained from the base of the skull through the vertex without intravenous contrast.  COMPARISON:  None.  FINDINGS: Skull and Sinuses:Negative for fracture.  No destructive process.  Opacification and sclerosis of the left mastoid tip consistent with chronic mastoiditis.  Orbits: No traumatic findings.  Bilateral cataract resection.  Brain: No evidence of  acute abnormality, such as acute infarction, hemorrhage, hydrocephalus, or mass lesion/mass effect.  There is generalized cerebral volume loss which is typical for age. Mild chronic vessel disease with ischemic gliosis confluent around the lateral ventricles.  IMPRESSION: No acute intracranial findings.   Electronically Signed   By: Tiburcio Pea M.D.   On: 01/17/2014 19:08   Mri Brain Without Contrast  01/18/2014  CLINICAL DATA:  Acute encephalopathy, with recent fall. Sudden onset of mental status changes. History of hypertension.  EXAM: MRI HEAD WITHOUT CONTRAST  TECHNIQUE: Multiplanar, multiecho pulse sequences of the brain and surrounding structures were obtained without intravenous contrast.  COMPARISON:  CT head 01/17/2014.  FINDINGS: The examination is motion degraded and was truncated prematurely due to patient request.  No restricted diffusion is evident. Generalized atrophy is observed. There is no midline abnormality on sagittal T1 weighted images. Gradient sequence unremarkable for intracranial foci of acute or chronic hemorrhage.  No other pulse sequences were obtained.  IMPRESSION: Prematurely truncated examination demonstrating no obvious acute intracranial abnormality.  Good general agreement with prior recent CT.   Electronically Signed   By: Davonna Belling M.D.   On: 01/18/2014 11:51   Mr Thoracic Spine Wo Contrast  01/04/2014   CLINICAL DATA:  Lower back pain, possibly urinary tract infection. History of compression fracture. Dysuria, with bladder catheter in place. Assess compression fractures.  EXAM: MRI THORACIC AND LUMBAR SPINE WITHOUT CONTRAST  TECHNIQUE: Multiplanar and multiecho pulse sequences of the thoracic and lumbar spine were obtained without intravenous contrast.  COMPARISON:  Thoracic spine radiographs January 04, 2014, MRI of the lumbar spine November 03, 2013, CT of the abdomen pelvis November 27, 2013  FINDINGS: MR THORACIC SPINE FINDINGS  Mild T12 compression fracture  with less than 20% height loss. Low T1, bright STIR signal within the vertebral body consistent with edema with inferior anterior endplate fracture. No retropulsed bony fragments. The remaining thoracic vertebral bodies are intact. No malalignment. Maintenance thoracic kyphosis. Intervertebral discs demonstrate overall normal morphology. Decreased T2 signal in all discs consistent with mild desiccation. Generalized bright T1 bone marrow signal likely represents osteopenia.  Thoracic spinal cord is normal in morphology and signal characteristics to the level the conus medullaris which terminates at L1. No syrinx.  Partially imaged at least 24 x 15 mm T2 bright lesion a RIGHT thyroid gland would be better characterized on dedicated thyroid sonogram on a nonemergent basis. 8 mm RIGHT T2 perineural cysts, with additional scattered subcentimeter perineural cysts to the level of T10.  Small central disc protrusion at T5-6 contacts the ventral spinal cord without canal stenosis. No neural foraminal narrowing at any thoracic level.  MR LUMBAR SPINE FINDINGS  Moderate to severe L1 burst fracture, with central low T1 and low T2 signal consistent with cement augmentation. 4 mm retropulsed bony fragments. Approximately 50% central vertebral body height loss, relatively unchanged. Moderate to severe chronic L4 insufficiency fracture. Moderate L3 chronic insufficiency fracture. Intact L2 and L5 vertebral bodies. Lumbar lordosis maintained. No malalignment.  Generalized bright T1 bone marrow signal suggest osteopenia.  Severe L2-3 disc degeneration with central bright STIR signal likely reflecting edema. Moderate L3-4, L4-5 moderate to severe L5-S1 disc height loss, with moderate multilevel disc degeneration. Severe L3-4, L4-5 acute on chronic discogenic endplate changes. Moderate to severe chronic L2-3 discogenic endplate changes, mild at L5-S1.  Conus medullaris terminates at T12-L1 and appears normal in morphology and signal  characteristics. Central displacement of the cauda equina due to canal stenosis, unchanged. Moderate to severe paraspinal muscle atrophy.  Level by level evaluation:  T12-L1: Retropulsed bony fragments L1, mild facet arthropathy. Mild to moderate canal stenosis with at least moderate RIGHT neural foraminal narrowing.  L2-3: Small broad-based disc osteophyte complex, mild facet arthropathy and ligamentum flavum redundancy. No canal stenosis though, partial effacement of the RIGHT lateral recess may affect the traversing RIGHT L3 nerve. Mild LEFT greater than  RIGHT neural foraminal narrowing.  L3-4: Moderate to large broad-based disc osteophyte complex asymmetric to the RIGHT may affect the exited RIGHT L3 nerve. Moderate to severe facet arthropathy and ligamentum flavum redundancy. Moderate to severe canal stenosis including LEFT lateral recess effacement which likely affect the traversing LEFT L4 nerve. Severe RIGHT, moderate LEFT neural foraminal narrowing.  L4-5: Large broad-based disc osteophyte complex asymmetric to the LEFT may affect the exiting LEFT L4 nerve. Severe LEFT greater than RIGHT facet arthropathy and ligamentum flavum redundancy. Severe canal stenosis with LEFT lateral recess effacement, likely affecting the traversing LEFT L5 nerve. Mild to moderate RIGHT, severe LEFT neural foraminal narrowing.  L5-S1: Small broad-based disc osteophyte complex asymmetric to the LEFT encroaches upon the exited LEFT L5 nerve. Severe LEFT and moderate RIGHT facet arthropathy ligamentum flavum redundancy. No canal stenosis. Partial effacement LEFT lateral recess may affect the traversing LEFT S1 nerve. Severe LEFT neural foraminal narrowing.  IMPRESSION: MR THORACIC SPINE IMPRESSION  Acute mild T12 compression/insufficiency fracture. No additional thoracic spine fractures. No malalignment.  No neurocompressive changes of the thoracic spine.  MR LUMBAR SPINE IMPRESSION  No acute fracture nor malalignment. L1 and L4  fractures, status post cement augmentation. Stable moderate L3 compression fracture. No progressed height loss of these fractures from prior imaging.  Lumbar spondylosis. Severe canal stenosis at L4-5, moderate to severe at L3-4.  Neural foraminal narrowing at all lumbar levels: Severe on the RIGHT at L3-4, severe on the LEFT at L4-5 and L5-S1.   Electronically Signed   By: Awilda Metroourtnay  Bloomer   On: 01/04/2014 22:36   Mr Lumbar Spine Wo Contrast  01/04/2014   CLINICAL DATA:  Lower back pain, possibly urinary tract infection. History of compression fracture. Dysuria, with bladder catheter in place. Assess compression fractures.  EXAM: MRI THORACIC AND LUMBAR SPINE WITHOUT CONTRAST  TECHNIQUE: Multiplanar and multiecho pulse sequences of the thoracic and lumbar spine were obtained without intravenous contrast.  COMPARISON:  Thoracic spine radiographs January 04, 2014, MRI of the lumbar spine November 03, 2013, CT of the abdomen pelvis November 27, 2013  FINDINGS: MR THORACIC SPINE FINDINGS  Mild T12 compression fracture with less than 20% height loss. Low T1, bright STIR signal within the vertebral body consistent with edema with inferior anterior endplate fracture. No retropulsed bony fragments. The remaining thoracic vertebral bodies are intact. No malalignment. Maintenance thoracic kyphosis. Intervertebral discs demonstrate overall normal morphology. Decreased T2 signal in all discs consistent with mild desiccation. Generalized bright T1 bone marrow signal likely represents osteopenia.  Thoracic spinal cord is normal in morphology and signal characteristics to the level the conus medullaris which terminates at L1. No syrinx.  Partially imaged at least 24 x 15 mm T2 bright lesion a RIGHT thyroid gland would be better characterized on dedicated thyroid sonogram on a nonemergent basis. 8 mm RIGHT T2 perineural cysts, with additional scattered subcentimeter perineural cysts to the level of T10.  Small central disc  protrusion at T5-6 contacts the ventral spinal cord without canal stenosis. No neural foraminal narrowing at any thoracic level.  MR LUMBAR SPINE FINDINGS  Moderate to severe L1 burst fracture, with central low T1 and low T2 signal consistent with cement augmentation. 4 mm retropulsed bony fragments. Approximately 50% central vertebral body height loss, relatively unchanged. Moderate to severe chronic L4 insufficiency fracture. Moderate L3 chronic insufficiency fracture. Intact L2 and L5 vertebral bodies. Lumbar lordosis maintained. No malalignment.  Generalized bright T1 bone marrow signal suggest osteopenia.  Severe L2-3 disc degeneration with  central bright STIR signal likely reflecting edema. Moderate L3-4, L4-5 moderate to severe L5-S1 disc height loss, with moderate multilevel disc degeneration. Severe L3-4, L4-5 acute on chronic discogenic endplate changes. Moderate to severe chronic L2-3 discogenic endplate changes, mild at L5-S1.  Conus medullaris terminates at T12-L1 and appears normal in morphology and signal characteristics. Central displacement of the cauda equina due to canal stenosis, unchanged. Moderate to severe paraspinal muscle atrophy.  Level by level evaluation:  T12-L1: Retropulsed bony fragments L1, mild facet arthropathy. Mild to moderate canal stenosis with at least moderate RIGHT neural foraminal narrowing.  L2-3: Small broad-based disc osteophyte complex, mild facet arthropathy and ligamentum flavum redundancy. No canal stenosis though, partial effacement of the RIGHT lateral recess may affect the traversing RIGHT L3 nerve. Mild LEFT greater than RIGHT neural foraminal narrowing.  L3-4: Moderate to large broad-based disc osteophyte complex asymmetric to the RIGHT may affect the exited RIGHT L3 nerve. Moderate to severe facet arthropathy and ligamentum flavum redundancy. Moderate to severe canal stenosis including LEFT lateral recess effacement which likely affect the traversing LEFT L4  nerve. Severe RIGHT, moderate LEFT neural foraminal narrowing.  L4-5: Large broad-based disc osteophyte complex asymmetric to the LEFT may affect the exiting LEFT L4 nerve. Severe LEFT greater than RIGHT facet arthropathy and ligamentum flavum redundancy. Severe canal stenosis with LEFT lateral recess effacement, likely affecting the traversing LEFT L5 nerve. Mild to moderate RIGHT, severe LEFT neural foraminal narrowing.  L5-S1: Small broad-based disc osteophyte complex asymmetric to the LEFT encroaches upon the exited LEFT L5 nerve. Severe LEFT and moderate RIGHT facet arthropathy ligamentum flavum redundancy. No canal stenosis. Partial effacement LEFT lateral recess may affect the traversing LEFT S1 nerve. Severe LEFT neural foraminal narrowing.  IMPRESSION: MR THORACIC SPINE IMPRESSION  Acute mild T12 compression/insufficiency fracture. No additional thoracic spine fractures. No malalignment.  No neurocompressive changes of the thoracic spine.  MR LUMBAR SPINE IMPRESSION  No acute fracture nor malalignment. L1 and L4 fractures, status post cement augmentation. Stable moderate L3 compression fracture. No progressed height loss of these fractures from prior imaging.  Lumbar spondylosis. Severe canal stenosis at L4-5, moderate to severe at L3-4.  Neural foraminal narrowing at all lumbar levels: Severe on the RIGHT at L3-4, severe on the LEFT at L4-5 and L5-S1.   Electronically Signed   By: Awilda Metro   On: 01/04/2014 22:36   Dg Chest Port 1 View  01/18/2014   CLINICAL DATA:  Chest pain  EXAM: PORTABLE CHEST - 1 VIEW  COMPARISON:  01/17/2014  FINDINGS: Cardiac shadow is stable. The lungs are again well aerated. Minimal atelectasis/scarring is noted in the right lung base. No focal infiltrate or sizable effusion is seen. No pneumothorax is noted. Changes of prior vertebral augmentation are again seen.  IMPRESSION: No acute abnormality noted.  No change from the prior exam.   Electronically Signed   By:  Alcide Clever M.D.   On: 01/18/2014 07:58   Ir Vertebroplasty Cervicothoracic Inj  01/19/2014   CLINICAL DATA:  Patient with severely painful compression fracture at T12.  EXAM: IR VERTEBROPLASTY CERVICOTHORACIC INJ  MEDICATIONS: Versed 0.5mg  mg IV, Fentanyl 25 mcg IV.  ANESTHESIA/SEDATION: Total Moderate Sedation Time:  20 min.  FLUOROSCOPY TIME:  7 minutes, 30 seconds.  PROCEDURE: Following a full explanation of the procedure along with the potential associated complications, an informed witnessed consent was obtained.  The patient was placed prone on the fluoroscopic table. Nasal oxygen was administered. Physiologic monitoring was performed throughout the  duration of the procedure. The skin overlying the thoracolumbar region was prepped and draped in the usual sterile fashion. The T12 vertebral body was identified and the right pedicle was infiltrated with 0.25% bupivacaine. This was then followed by the advancement of a 13-gauge Cook needle through the right pedicle into the anterior one-third at T12. A gentle contrast injection demonstrated a trabecular pattern of contrast.  At this time, methylmethacrylate mixture was reconstituted. Under biplane intermittent fluoroscopy, the methylmethacrylate was then injected into the T12 vertebral body with filling of the vertebral body.  No extravasation was noted into the disk spaces or posteriorly into the spinal canal. No epidural venous contamination was seen.  The needle was then removed. Hemostasis was achieved at the skin entry site.  There were no acute complications. Patient tolerated the procedure well. The patient was observed for 3 hours and discharged in good condition.  IMPRESSION: Status post vertebral body augmentation for painful compression fracture at T12 using vertebroplasty technique.   Electronically Signed   By: Julieanne CottonSanjeev  Deveshwar M.D.   On: 01/15/2014 16:10   Ir Radiologist Eval & Mgmt  01/05/2014   EXAM: ESTABLISHED PATIENT OFFICE VISIT   CHIEF COMPLAINT: Severe low back pain.  Current Pain Level: 8-10  HISTORY OF PRESENT ILLNESS: The patient is a 80 year old lady who presents with her family with history of new onset of severe low back pain. It is unclear from the patient or the family as to when exactly the worsening of the pain started.  She underwent a vertebral body augmentation approximately 2 weeks ago at L1. This did produce significant relief in terms of decreasing her pain medication.  Also she was able to improve her ability and be involved in physical therapy.  It is unclear as to when the worsening of this new pain occurred. However, according to the family the patient fell a few days ago.  At the present time the patient has severe pain in the lumbar region which is almost constant, and relieved to some degree by lying down. She also takes oxycodone 5 mg 4 times a day which does provide some relief.  The patient has great difficulty in ambulating at the present time. She has, however, been involved with physical therapy over the past few days. She denies any radiation of pain into her lower extremities in a radicular manner. She denies any symptoms of fever, chills or rigors or symptoms or signs of UTI.  Her appetite has significantly decreased with resultant loss of weight.  Past Medical History:  As before has high blood pressure.  Previous Surgeries: Vertebroplasty kyphoplasty in September of this year.  Medications: Wellbutrin. Cozaar. Lidoderm patch. Vitamin D3. Calcium citrate tablets. MiraLax. Colace. Senna. Xanax 0.25 mg twice daily. Oxycodone 5 mg 4 times a day.  Allergies:  Denies any allergies at this time.  PHYSICAL EXAMINATION: The patient is in severe distress on account of her back pain.  Grossly, however, she remains alert, oriented and appropriately responsive.  Palpation of the back region revealed severe tenderness at the lumbar level.  ASSESSMENT AND PLAN: Given the patient's above history and physical exam, the  patient and her family were informed that a work up to rule out a new fracture was probably needed. This would entail getting MRI scan of the lumbar spine and the thoracic spine. An emergent thoracic and lumbar MRI could not be arranged as outpatient given the clearance needed from her insurance company. An option was for the patient to present to  the ER and be evaluated there for an emergent workup and possible admission. This was explained to the patient and the patient's family. ER was contacted in this regard.   Electronically Signed   By: Julieanne Cotton M.D.   On: 01/04/2014 13:56    Microbiology: Recent Results (from the past 240 hour(s))  MRSA PCR SCREENING     Status: None   Collection Time    01/17/14  9:41 PM      Result Value Ref Range Status   MRSA by PCR NEGATIVE  NEGATIVE Final   Comment:            The GeneXpert MRSA Assay (FDA     approved for NASAL specimens     only), is one component of a     comprehensive MRSA colonization     surveillance program. It is not     intended to diagnose MRSA     infection nor to guide or     monitor treatment for     MRSA infections.     Labs: Basic Metabolic Panel:  Recent Labs Lab 01/15/14 1200 01/17/14 1820 01/17/14 2242 01/18/14 0417 01/19/14 0348  NA 139 144  --  139 137  K 3.2* 3.4*  --  2.9* 3.4*  CL 100 103  --  101 101  CO2 28 28  --  26 24  GLUCOSE 112* 108*  --  106* 103*  BUN 12 19  --  13 10  CREATININE 0.52 0.82 0.65 0.54 0.50  CALCIUM 8.9 9.5  --  8.4 8.7   Liver Function Tests:  Recent Labs Lab 01/17/14 1820 01/18/14 0417  AST 16 16  ALT 11 10  ALKPHOS 167* 151*  BILITOT 0.3 0.3  PROT 6.1 5.4*  ALBUMIN 3.1* 2.8*   No results found for this basename: LIPASE, AMYLASE,  in the last 168 hours  Recent Labs Lab 01/17/14 2242  AMMONIA 22   CBC:  Recent Labs Lab 01/15/14 1200 01/17/14 1820 01/17/14 2242 01/18/14 0417  WBC 6.0 7.8 7.2 6.0  NEUTROABS  --  5.8  --  3.4  HGB 11.5* 11.6*  10.7* 10.7*  HCT 33.9* 34.9* 32.3* 31.1*  MCV 95.5 96.7 95.6 94.2  PLT 303 346 315 302   Cardiac Enzymes:  Recent Labs Lab 01/18/14 0417 01/18/14 0950 01/18/14 1601  TROPONINI <0.30 <0.30 <0.30   BNP: BNP (last 3 results) No results found for this basename: PROBNP,  in the last 8760 hours CBG:  Recent Labs Lab 01/18/14 0752 01/18/14 2055 01/19/14 0731 01/19/14 1122  GLUCAP 97 153* 121* 143*    Signed:  Jnae Thomaston K  [facility] 01/19/2014, 11:53 AM

## 2014-01-20 NOTE — Progress Notes (Signed)
Clinical Social Work  English as a second language teachernsurance authorization received from [employer] [id number].  Unk LightningHolly Imanni Burd[place], [place]

## 2014-01-22 NOTE — Progress Notes (Signed)
Additional clinical information sent per [facility]

## 2014-01-25 ENCOUNTER — Encounter (HOSPITAL_COMMUNITY): Payer: Self-pay | Admitting: Emergency Medicine

## 2014-01-29 ENCOUNTER — Other Ambulatory Visit (HOSPITAL_COMMUNITY): Payer: Self-pay | Admitting: Interventional Radiology

## 2014-01-29 DIAGNOSIS — IMO0002 Reserved for concepts with insufficient information to code with codable children: Secondary | ICD-10-CM

## 2014-02-01 ENCOUNTER — Ambulatory Visit (HOSPITAL_COMMUNITY)
Admission: RE | Admit: 2014-02-01 | Discharge: 2014-02-01 | Disposition: A | Discharge status: Home / Self Care | Payer: Medicare Other | Source: Ambulatory Visit | Attending: Interventional Radiology | Admitting: Interventional Radiology

## 2014-02-01 DIAGNOSIS — IMO0002 Reserved for concepts with insufficient information to code with codable children: Secondary | ICD-10-CM

## 2014-02-02 ENCOUNTER — Ambulatory Visit (HOSPITAL_COMMUNITY): Admission: RE | Admit: 2014-02-02 | Payer: Medicare Other | Source: Ambulatory Visit

## 2015-03-28 MED FILL — LYRICA 25 MG CAPSULE: 25 | 30 days supply | Qty: 60 | Fill #2

## 2015-04-07 MED FILL — BUPROPION SR 150 MG TABLET: 150 | 30 days supply | Qty: 30 | Fill #3

## 2015-04-25 MED FILL — LYRICA 25 MG CAPSULE: 25 | 30 days supply | Qty: 60 | Fill #3

## 2015-05-03 DIAGNOSIS — N390 Urinary tract infection, site not specified: Secondary | ICD-10-CM | POA: Diagnosis not present

## 2015-05-03 DIAGNOSIS — R399 Unspecified symptoms and signs involving the genitourinary system: Secondary | ICD-10-CM | POA: Diagnosis not present

## 2015-05-03 DIAGNOSIS — M549 Dorsalgia, unspecified: Secondary | ICD-10-CM | POA: Diagnosis not present

## 2015-05-10 MED FILL — BUPROPION HCL SR 150 MG TAB: 150 | 30 days supply | Qty: 30 | Fill #4

## 2015-05-18 DIAGNOSIS — M545 Low back pain: Secondary | ICD-10-CM | POA: Diagnosis not present

## 2015-05-18 DIAGNOSIS — I159 Secondary hypertension, unspecified: Secondary | ICD-10-CM | POA: Diagnosis not present

## 2015-05-18 DIAGNOSIS — R300 Dysuria: Secondary | ICD-10-CM | POA: Diagnosis not present

## 2015-05-18 DIAGNOSIS — M810 Age-related osteoporosis without current pathological fracture: Secondary | ICD-10-CM | POA: Diagnosis not present

## 2015-05-18 DIAGNOSIS — I10 Essential (primary) hypertension: Secondary | ICD-10-CM | POA: Diagnosis not present

## 2015-05-18 DIAGNOSIS — Z1389 Encounter for screening for other disorder: Secondary | ICD-10-CM | POA: Diagnosis not present

## 2015-05-30 MED FILL — LYRICA 25 MG CAPSULE: 25 | 30 days supply | Qty: 60 | Fill #4

## 2015-06-06 MED FILL — BUPROPION HCL SR 150 MG TAB: 150 | 30 days supply | Qty: 30 | Fill #5

## 2015-06-09 DIAGNOSIS — M5430 Sciatica, unspecified side: Secondary | ICD-10-CM | POA: Diagnosis not present

## 2015-06-09 DIAGNOSIS — M810 Age-related osteoporosis without current pathological fracture: Secondary | ICD-10-CM | POA: Diagnosis not present

## 2015-06-09 DIAGNOSIS — I10 Essential (primary) hypertension: Secondary | ICD-10-CM | POA: Diagnosis not present

## 2015-06-09 DIAGNOSIS — M47816 Spondylosis without myelopathy or radiculopathy, lumbar region: Secondary | ICD-10-CM | POA: Diagnosis not present

## 2015-06-30 MED FILL — LYRICA 25 MG CAPSULE: 25 | 30 days supply | Qty: 60 | Fill #5

## 2015-07-08 MED FILL — BUPROPION HCL SR 150 MG TAB: 150 | 30 days supply | Qty: 30 | Fill #6

## 2015-07-28 MED FILL — LYRICA 25 MG CAPSULE: 25 | 30 days supply | Qty: 60 | Fill #0

## 2015-08-09 MED FILL — BUPROPION HCL SR 150 MG TAB: 150 | 30 days supply | Qty: 30 | Fill #0

## 2015-08-25 DIAGNOSIS — N390 Urinary tract infection, site not specified: Secondary | ICD-10-CM | POA: Diagnosis not present

## 2015-08-25 MED FILL — LYRICA 25 MG CAPSULE: 25 | 30 days supply | Qty: 60 | Fill #1

## 2015-08-31 DIAGNOSIS — Z9181 History of falling: Secondary | ICD-10-CM | POA: Diagnosis not present

## 2015-08-31 DIAGNOSIS — E119 Type 2 diabetes mellitus without complications: Secondary | ICD-10-CM | POA: Diagnosis not present

## 2015-08-31 DIAGNOSIS — M85852 Other specified disorders of bone density and structure, left thigh: Secondary | ICD-10-CM | POA: Diagnosis not present

## 2015-08-31 DIAGNOSIS — I10 Essential (primary) hypertension: Secondary | ICD-10-CM | POA: Diagnosis not present

## 2015-08-31 DIAGNOSIS — M810 Age-related osteoporosis without current pathological fracture: Secondary | ICD-10-CM | POA: Diagnosis not present

## 2015-08-31 DIAGNOSIS — H353190 Nonexudative age-related macular degeneration, unspecified eye, stage unspecified: Secondary | ICD-10-CM | POA: Diagnosis not present

## 2015-08-31 DIAGNOSIS — M5441 Lumbago with sciatica, right side: Secondary | ICD-10-CM | POA: Diagnosis not present

## 2015-08-31 DIAGNOSIS — M419 Scoliosis, unspecified: Secondary | ICD-10-CM | POA: Diagnosis not present

## 2015-08-31 DIAGNOSIS — M85851 Other specified disorders of bone density and structure, right thigh: Secondary | ICD-10-CM | POA: Diagnosis not present

## 2015-08-31 DIAGNOSIS — Z791 Long term (current) use of non-steroidal anti-inflammatories (NSAID): Secondary | ICD-10-CM | POA: Diagnosis not present

## 2015-09-05 MED FILL — BUPROPION HCL SR 150 MG TAB: 150 | 30 days supply | Qty: 30 | Fill #1

## 2015-09-09 DIAGNOSIS — M5441 Lumbago with sciatica, right side: Secondary | ICD-10-CM | POA: Diagnosis not present

## 2015-09-09 DIAGNOSIS — M85851 Other specified disorders of bone density and structure, right thigh: Secondary | ICD-10-CM | POA: Diagnosis not present

## 2015-09-09 DIAGNOSIS — M810 Age-related osteoporosis without current pathological fracture: Secondary | ICD-10-CM | POA: Diagnosis not present

## 2015-09-09 DIAGNOSIS — M85852 Other specified disorders of bone density and structure, left thigh: Secondary | ICD-10-CM | POA: Diagnosis not present

## 2015-09-09 DIAGNOSIS — E119 Type 2 diabetes mellitus without complications: Secondary | ICD-10-CM | POA: Diagnosis not present

## 2015-09-09 DIAGNOSIS — M419 Scoliosis, unspecified: Secondary | ICD-10-CM | POA: Diagnosis not present

## 2015-09-09 DIAGNOSIS — Z791 Long term (current) use of non-steroidal anti-inflammatories (NSAID): Secondary | ICD-10-CM | POA: Diagnosis not present

## 2015-09-09 DIAGNOSIS — I10 Essential (primary) hypertension: Secondary | ICD-10-CM | POA: Diagnosis not present

## 2015-09-09 DIAGNOSIS — H353190 Nonexudative age-related macular degeneration, unspecified eye, stage unspecified: Secondary | ICD-10-CM | POA: Diagnosis not present

## 2015-09-09 DIAGNOSIS — Z9181 History of falling: Secondary | ICD-10-CM | POA: Diagnosis not present

## 2015-09-13 DIAGNOSIS — M85851 Other specified disorders of bone density and structure, right thigh: Secondary | ICD-10-CM | POA: Diagnosis not present

## 2015-09-13 DIAGNOSIS — E119 Type 2 diabetes mellitus without complications: Secondary | ICD-10-CM | POA: Diagnosis not present

## 2015-09-13 DIAGNOSIS — Z9181 History of falling: Secondary | ICD-10-CM | POA: Diagnosis not present

## 2015-09-13 DIAGNOSIS — I10 Essential (primary) hypertension: Secondary | ICD-10-CM | POA: Diagnosis not present

## 2015-09-13 DIAGNOSIS — M419 Scoliosis, unspecified: Secondary | ICD-10-CM | POA: Diagnosis not present

## 2015-09-13 DIAGNOSIS — Z791 Long term (current) use of non-steroidal anti-inflammatories (NSAID): Secondary | ICD-10-CM | POA: Diagnosis not present

## 2015-09-13 DIAGNOSIS — M5441 Lumbago with sciatica, right side: Secondary | ICD-10-CM | POA: Diagnosis not present

## 2015-09-13 DIAGNOSIS — H353190 Nonexudative age-related macular degeneration, unspecified eye, stage unspecified: Secondary | ICD-10-CM | POA: Diagnosis not present

## 2015-09-13 DIAGNOSIS — M810 Age-related osteoporosis without current pathological fracture: Secondary | ICD-10-CM | POA: Diagnosis not present

## 2015-09-13 DIAGNOSIS — M85852 Other specified disorders of bone density and structure, left thigh: Secondary | ICD-10-CM | POA: Diagnosis not present

## 2015-09-19 DIAGNOSIS — G8929 Other chronic pain: Secondary | ICD-10-CM | POA: Diagnosis not present

## 2015-09-21 MED FILL — LYRICA 25 MG CAPSULE: 25 | 30 days supply | Qty: 60 | Fill #2

## 2015-09-28 MED FILL — DONEPEZIL HCL 5 MG TABLET: 5 | 30 days supply | Qty: 30 | Fill #0

## 2015-10-06 MED FILL — BUPROPION SR 150 MG TABLET: 150 | 30 days supply | Qty: 30 | Fill #2

## 2015-10-18 MED FILL — LYRICA 25 MG CAPSULE: 25 | 30 days supply | Qty: 90 | Fill #0

## 2015-10-27 DIAGNOSIS — G8929 Other chronic pain: Secondary | ICD-10-CM | POA: Diagnosis not present

## 2015-11-04 MED FILL — ONDANSETRON HCL 4 MG TABLET: 4 | 30 days supply | Qty: 90 | Fill #0

## 2015-11-08 MED FILL — BUPROPION HCL SR 150 MG TAB: 150 | 30 days supply | Qty: 30 | Fill #3

## 2015-11-11 DIAGNOSIS — H02834 Dermatochalasis of left upper eyelid: Secondary | ICD-10-CM | POA: Diagnosis not present

## 2015-11-11 DIAGNOSIS — H353111 Nonexudative age-related macular degeneration, right eye, early dry stage: Secondary | ICD-10-CM | POA: Diagnosis not present

## 2015-11-11 DIAGNOSIS — H02831 Dermatochalasis of right upper eyelid: Secondary | ICD-10-CM | POA: Diagnosis not present

## 2015-11-11 DIAGNOSIS — H353124 Nonexudative age-related macular degeneration, left eye, advanced atrophic with subfoveal involvement: Secondary | ICD-10-CM | POA: Diagnosis not present

## 2015-11-11 DIAGNOSIS — H04123 Dry eye syndrome of bilateral lacrimal glands: Secondary | ICD-10-CM | POA: Diagnosis not present

## 2015-11-11 DIAGNOSIS — Z961 Presence of intraocular lens: Secondary | ICD-10-CM | POA: Diagnosis not present

## 2015-11-14 MED FILL — DONEPEZIL HCL 5 MG TABLET: 5 | 30 days supply | Qty: 30 | Fill #0

## 2015-11-15 MED FILL — LYRICA 25 MG CAPSULE: 25 | 30 days supply | Qty: 90 | Fill #1

## 2015-11-22 MED FILL — NAPROXEN 500 MG TABLET: 500 | 30 days supply | Qty: 60 | Fill #0

## 2015-11-22 MED FILL — diazePAM 2 MG TABS: 2 | 30 days supply | Qty: 30 | Fill #0

## 2015-12-12 MED FILL — DONEPEZIL HCL 5 MG TABLET: 5 | 30 days supply | Qty: 30 | Fill #1

## 2015-12-14 MED FILL — BUPROPION SR 150 MG TABLET: 150 | 30 days supply | Qty: 30 | Fill #0

## 2015-12-22 MED FILL — NAPROXEN 500 MG TABLET: 500 | 30 days supply | Qty: 60 | Fill #0

## 2015-12-23 MED FILL — diazePAM 2 MG TABS: 2 | 30 days supply | Qty: 30 | Fill #0

## 2016-01-09 MED FILL — DONEPEZIL HCL 10 MG TABLET: 10 | 90 days supply | Qty: 90 | Fill #0

## 2016-01-16 MED FILL — BUPROPION SR 150 MG TABLET: 150 | 30 days supply | Qty: 30 | Fill #1

## 2016-01-20 MED FILL — NAPROXEN 500 MG TABLET: 500 | 30 days supply | Qty: 60 | Fill #1

## 2016-02-02 MED FILL — diazePAM 2 MG TABS: 2 | 30 days supply | Qty: 30 | Fill #0

## 2016-02-15 MED FILL — BUPROPION SR 150 MG TABLET: 150 | 30 days supply | Qty: 30 | Fill #2

## 2016-02-17 MED FILL — NAPROXEN 500 MG TABLET: 500 | 30 days supply | Qty: 60 | Fill #2

## 2016-02-29 IMAGING — XA IR VERTEBROPLASTY LUMBOSACRAL INJ
1 series · 14 of 24 positions shown · IV contrast (IODINE)
Comparison: none

CLINICAL DATA: Patient with severely painful compression fracture
at L4.

[Series 300: ir vertebroplasty lumobsacral inj · 14 of 34 slices shown]
[im 1/34]
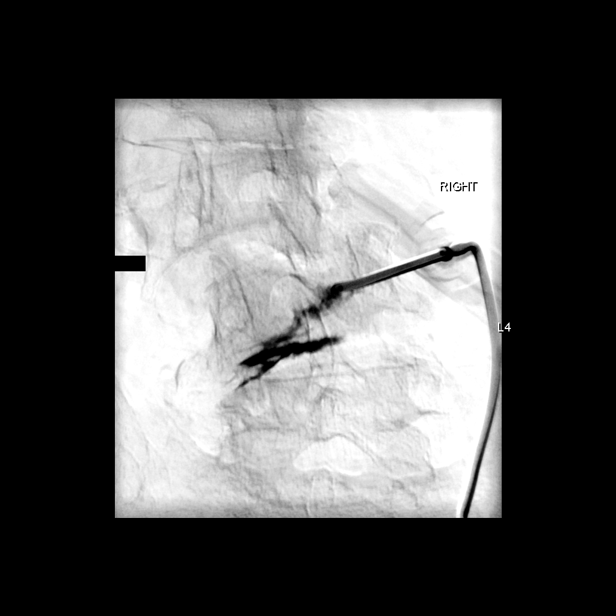
[im 3/34]
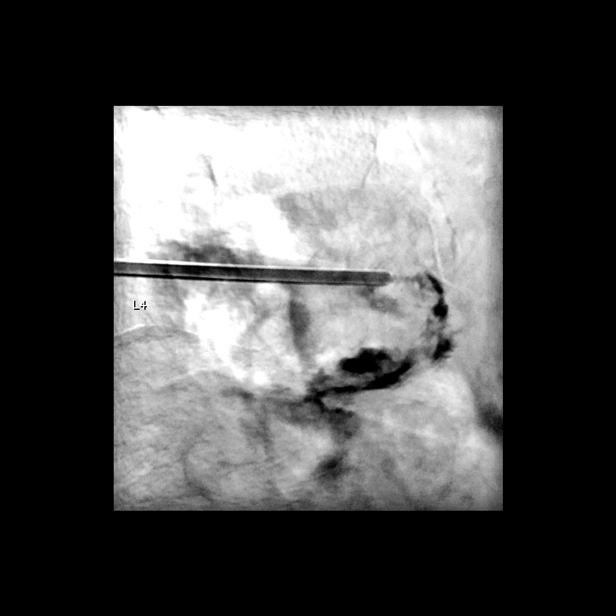
[im 6/34]
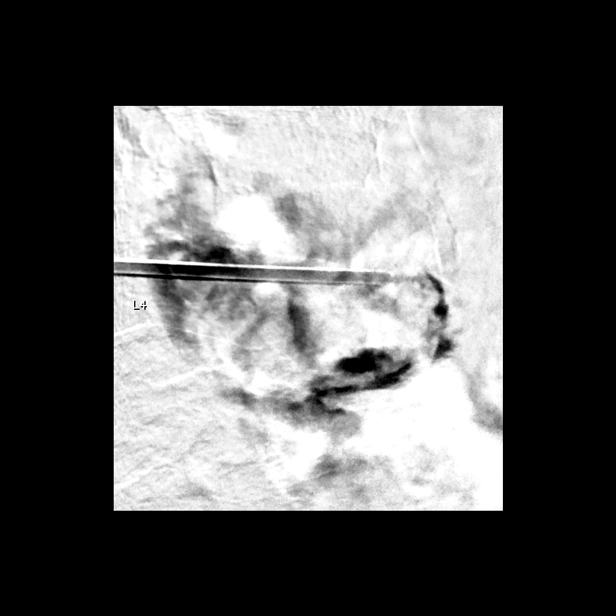
[im 9/34]
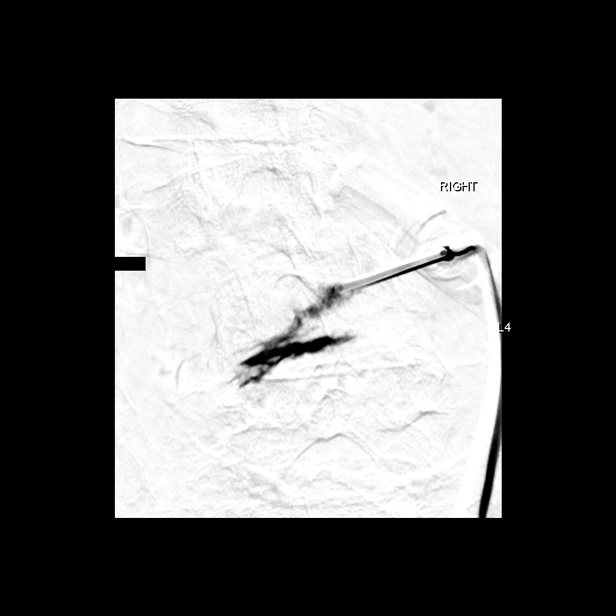
[im 11/34]
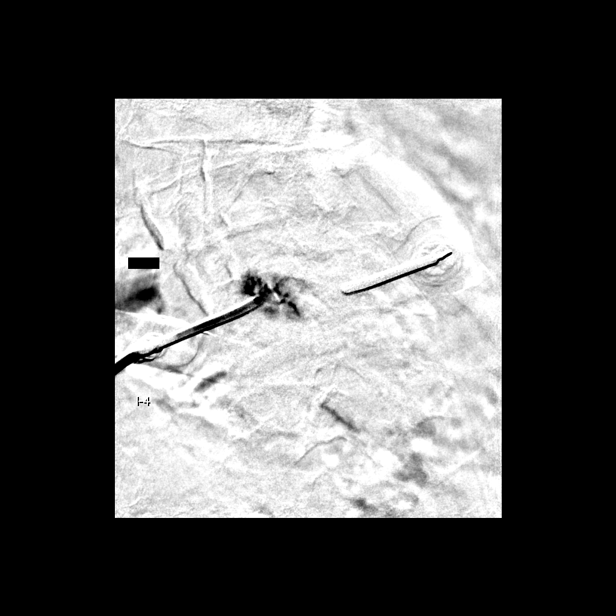
[im 13/34]
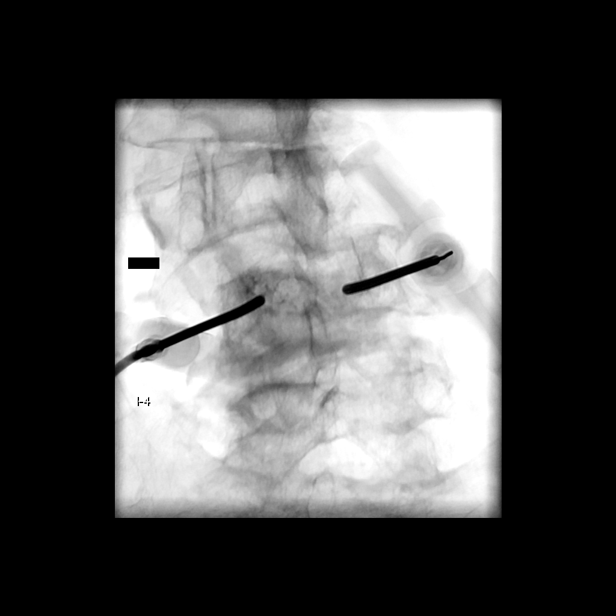
[im 16/34]
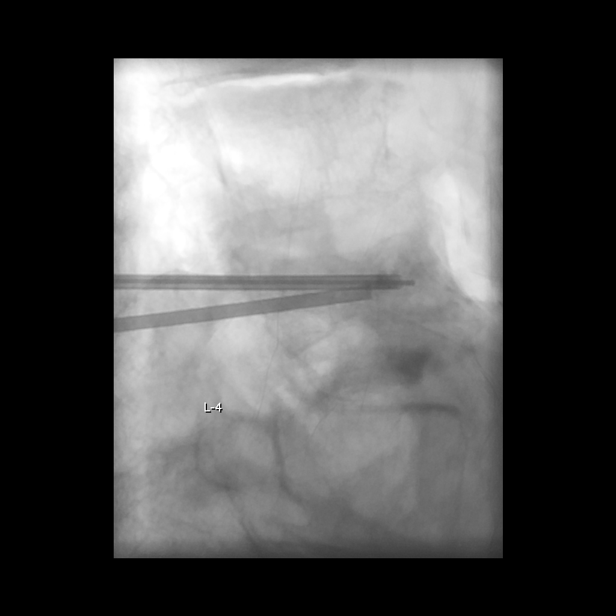
[im 18/34]
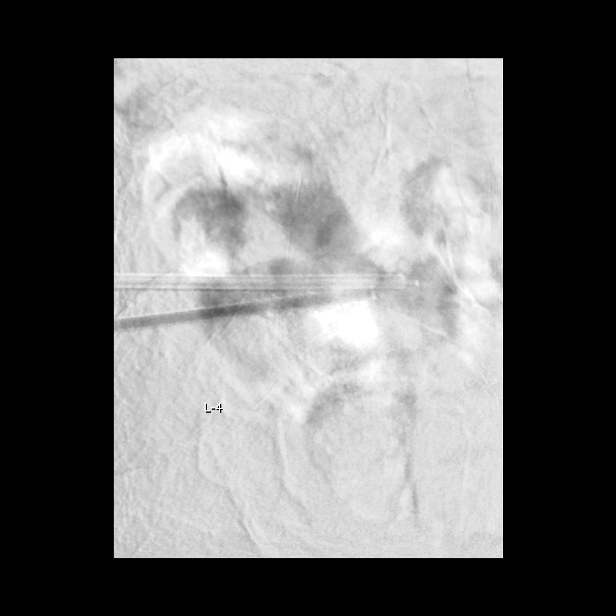
[im 21/34]
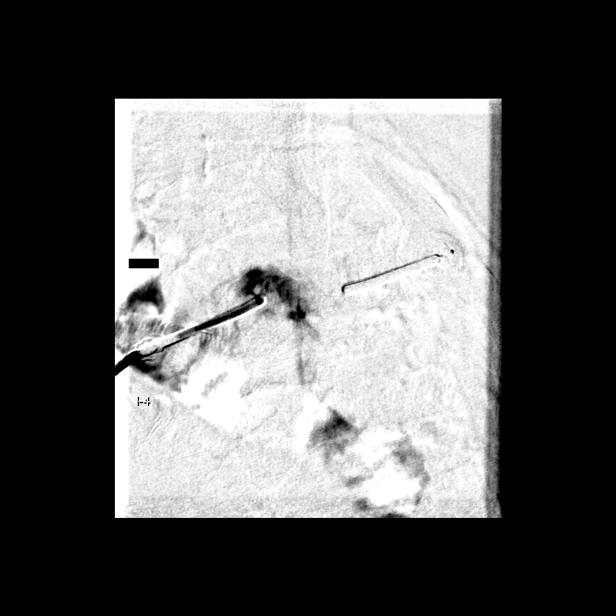
[im 23/34]
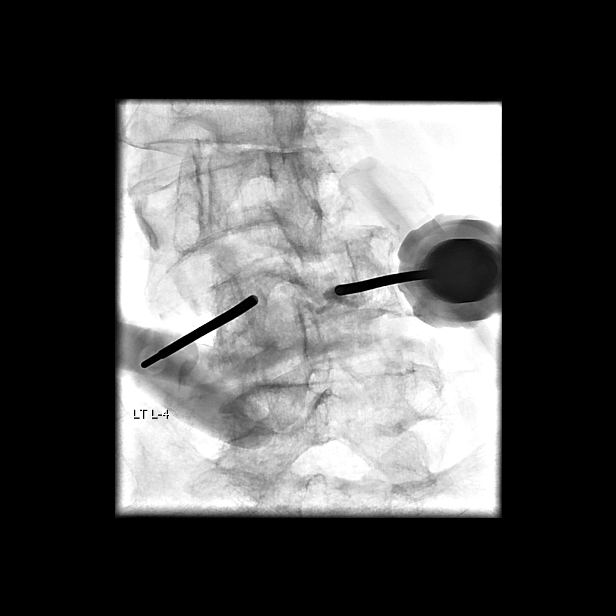
[im 26/34]
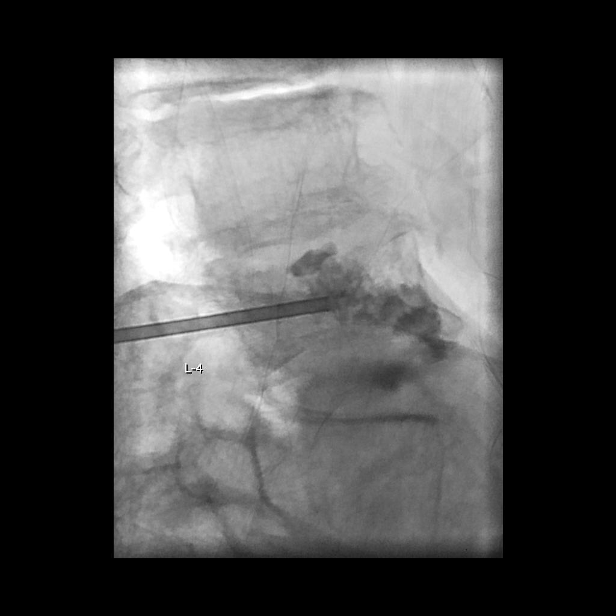
[im 28/34]
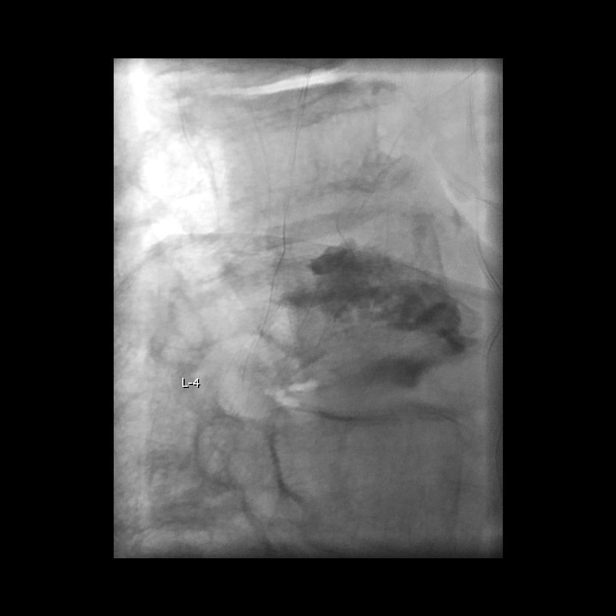
[im 31/34]
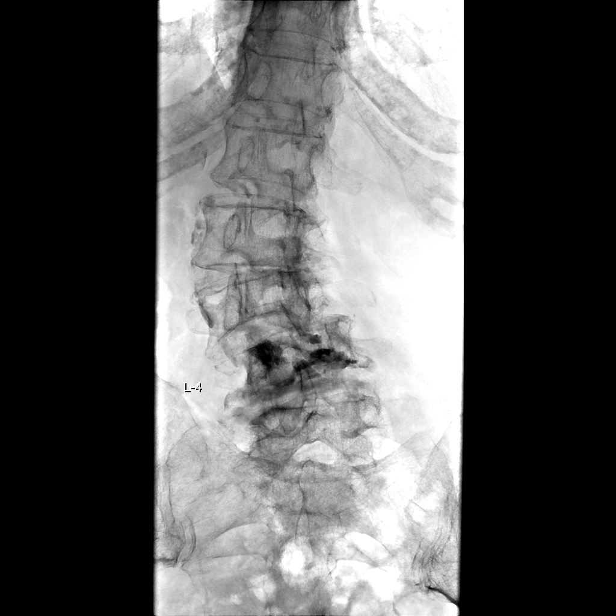
[im 34/34]
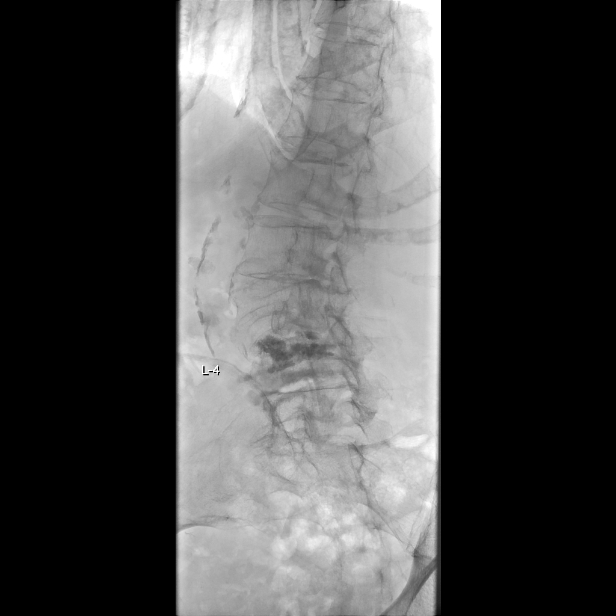

[14 of 24 positions shown; findings below may reference images not displayed]

EXAM:
IR VERTEBROPLASTY LUMBOSACRAL INJ

MEDICATIONS:
Versed 1.5 mg IV, Fentanyl 50 mcg IV.

ANESTHESIA/SEDATION:
Total Moderate Sedation Time:  27 min.

FLUOROSCOPY TIME:  9 min 18 seconds.

PROCEDURE:
Following a full explanation of the procedure along with the
potential associated complications, a witnessed informed consent was
obtained.

The patient was placed prone on the fluoroscopic table. Nasal oxygen
was administered. Physiologic monitoring was performed throughout
the duration of the procedure. The skin overlying the lumbar region
was prepped and draped in the usual sterile fashion. The L4
vertebral body was identified and the right and left pedicle were
infiltrated with 0.25% Bupivacaine. This was then followed by the
advancement of a 13-gauge Cook needle through the pedicles into the
anterior one-third at L4. A gentle contrast injection demonstrated a
trabecular pattern of contrast enhancement with early opacification
of adjacent veins.

At this time, methylmethacrylate mixture was reconstituted. Under
biplane intermittent fluoroscopy, the methylmethacrylate was then
injected into the L4 vertebral body with filling of the vertebral
body L4.

No extravasation was noted into the disk spaces or posteriorly into
the spinal canal. No epidural venous contamination was seen.

The needles were then removed. Hemostasis was achieved at the skin
entry site.

There were no acute complications. Patient tolerated the procedure
well. The patient was observed for 3 hours and discharged in good
condition.
IMPRESSION: Status post vertebral body augmentation for painful compression
fracture at L4 using vertebroplasty technique.

## 2016-03-16 MED FILL — diazePAM 2 MG TABS: 2 | 30 days supply | Qty: 30 | Fill #0

## 2016-03-16 MED FILL — BUPROPION SR 150 MG TABLET: 150 | 30 days supply | Qty: 30 | Fill #3

## 2016-03-21 IMAGING — CR DG ABD PORTABLE 1V
1 series · 1 of 1 positions shown · non-contrast
Comparison: CT, 12/14/2013

CLINICAL DATA: Evaluate fecal impaction. Patient had a bowel
movement this morning.

EXAM:
PORTABLE ABDOMEN - 1 VIEW

[ap (kub)]
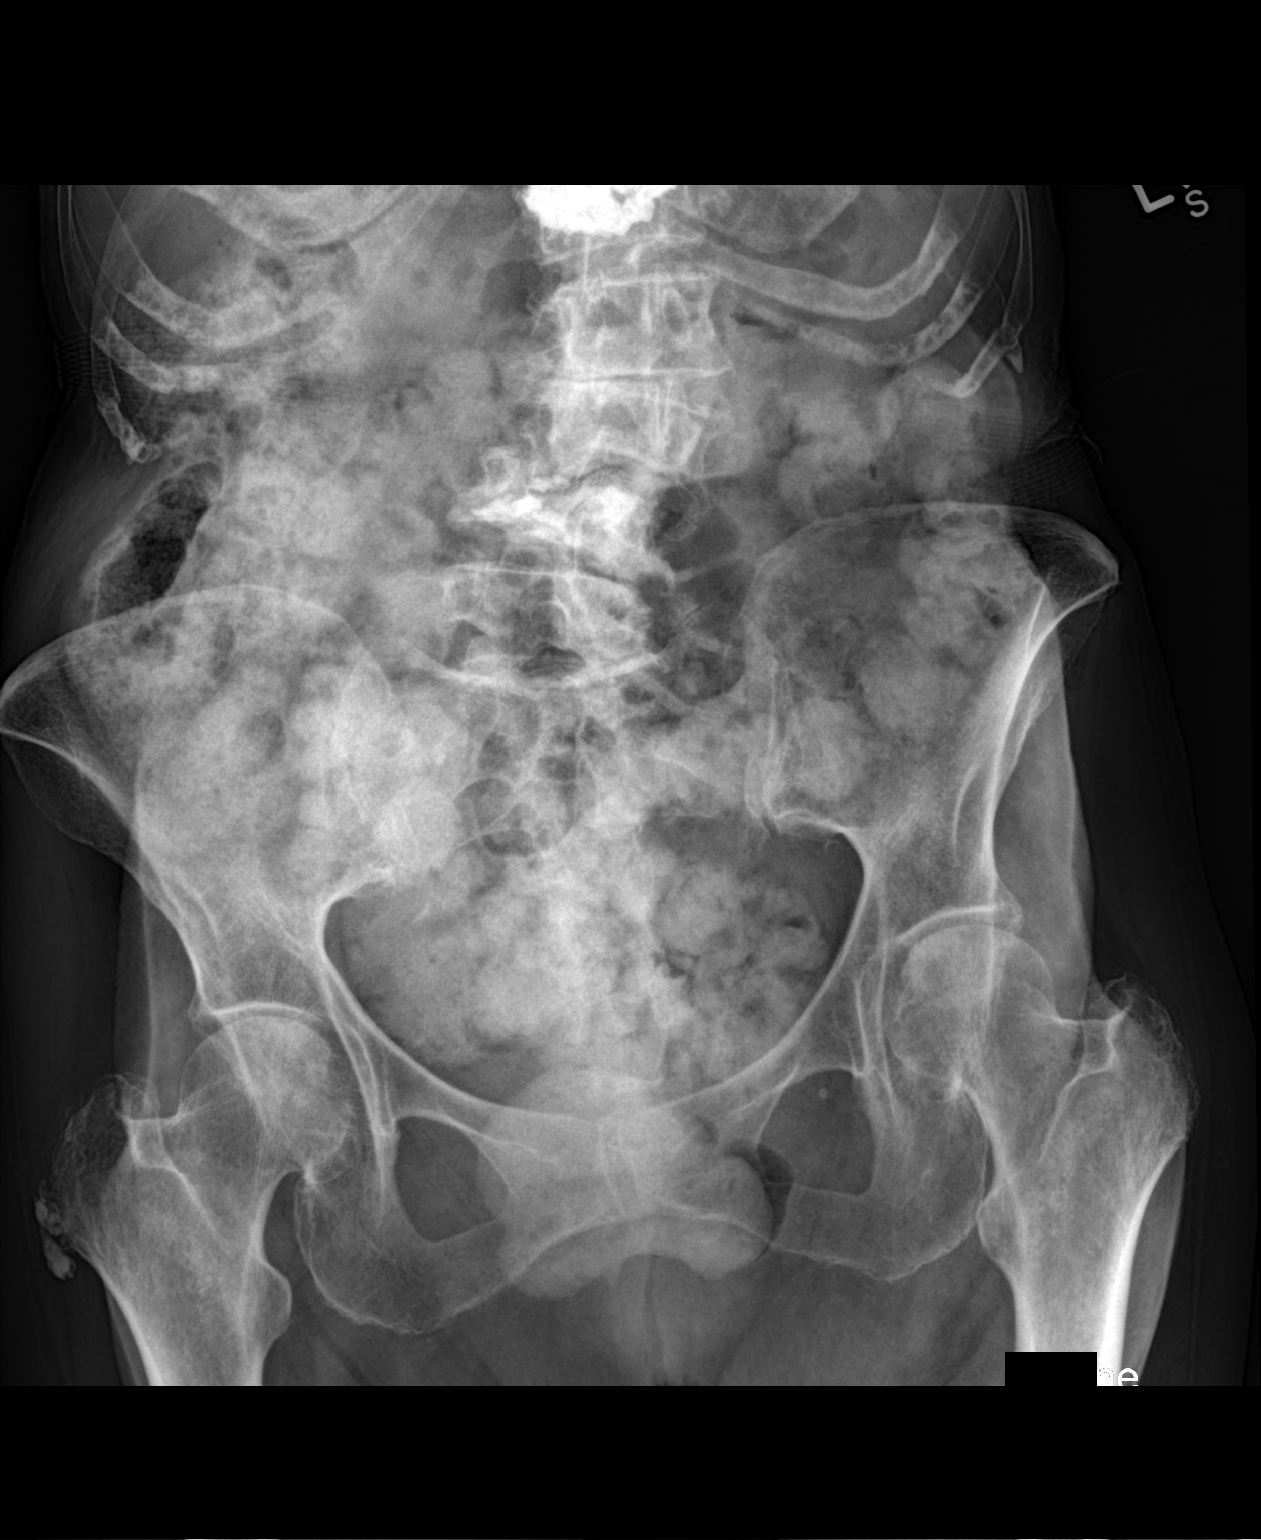

[1 of 1 positions shown; findings below may reference images not displayed]

FINDINGS: There is still significant stool throughout the colon and in the
rectum, but this appears decreased from the prior CT most evident in
the sigmoid colon. There is no significant bowel distention on the
current exam. No evidence of obstruction.
IMPRESSION: There has been a decrease in rectal and colonic stool since prior
study, although the stool burden remains moderately increased. No
bowel obstruction.

## 2016-03-21 MED FILL — NAPROXEN 500 MG TABLET: 500 | 30 days supply | Qty: 60 | Fill #3

## 2016-03-22 IMAGING — DX DG ABD PORTABLE 1V
1 series · 1 of 1 positions shown · non-contrast
Comparison: 12/18/2013

CLINICAL DATA: Follow-up fecal impaction.

EXAM:
PORTABLE ABDOMEN - 1 VIEW

[abdomen kub]
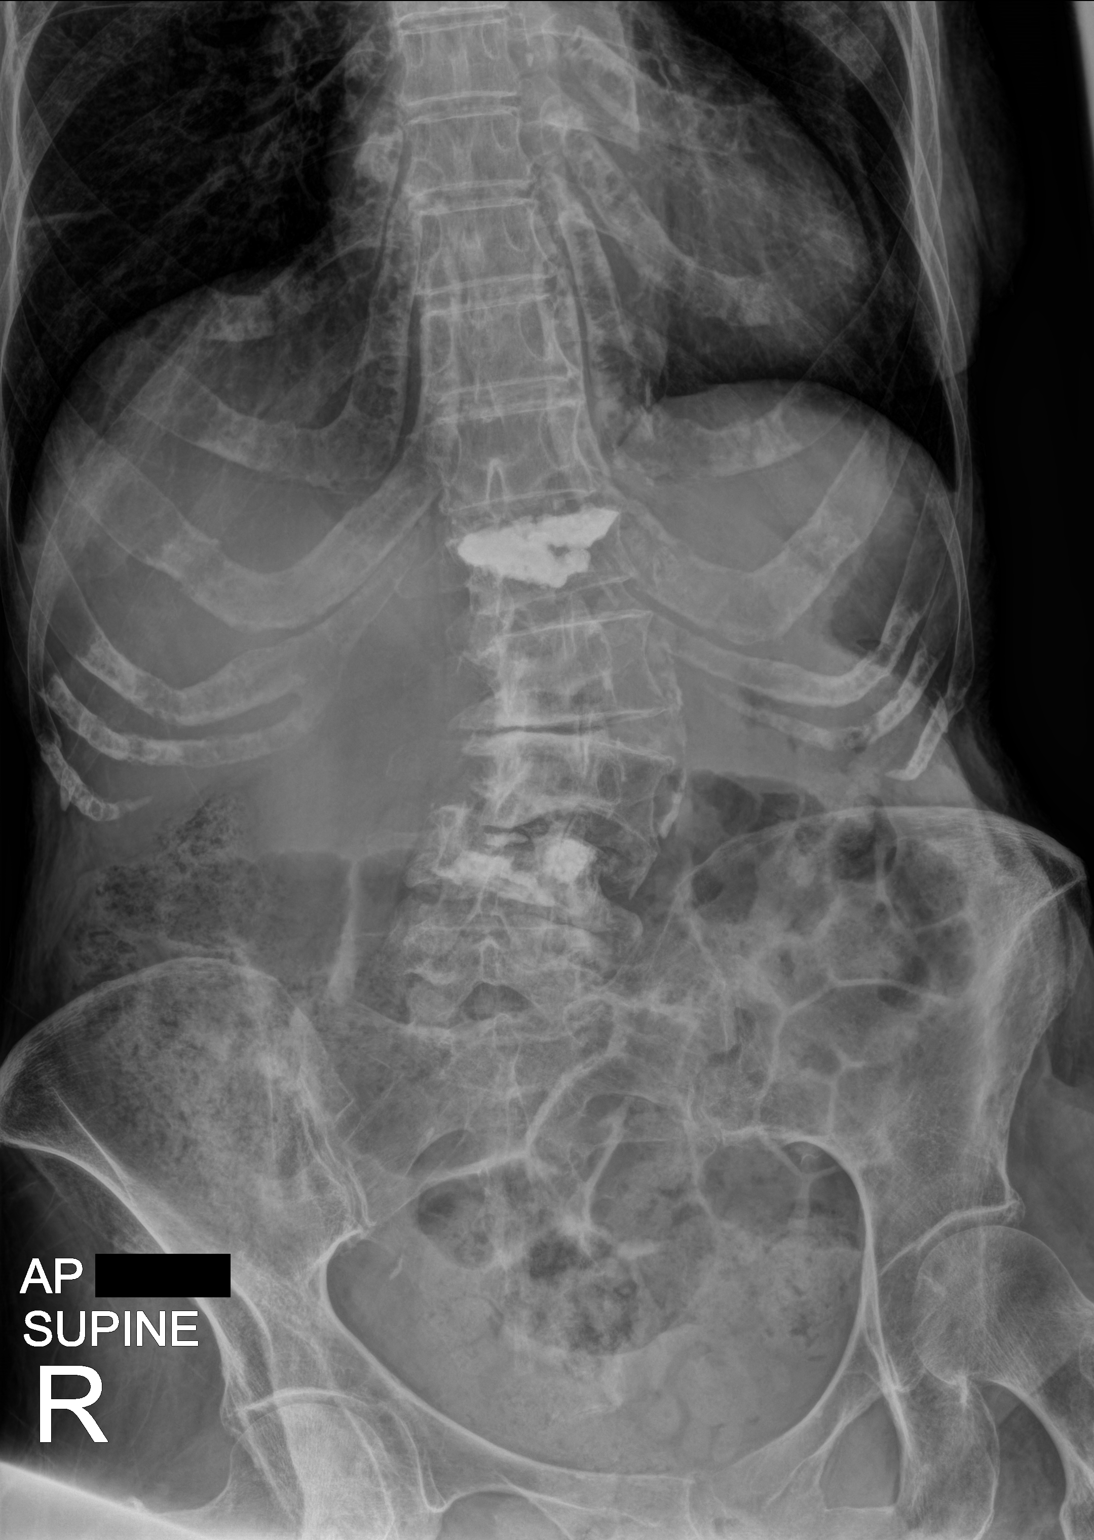

[1 of 1 positions shown; findings below may reference images not displayed]

FINDINGS: There is at least moderate stool burden in the lower abdomen and
pelvis. Gas filled loops of bowel but there is a nonobstructive
bowel gas pattern. Again noted is scoliosis in the lumbar spine with
two levels of vertebral body augmentation.
IMPRESSION: Moderate stool burden in the abdomen and pelvis.

## 2016-04-11 MED FILL — BUPROPION SR 150 MG TABLET: 150 | 30 days supply | Qty: 30 | Fill #4

## 2016-04-12 MED FILL — DONEPEZIL HCL 10 MG TABLET: 10 | 90 days supply | Qty: 90 | Fill #0

## 2016-04-30 MED FILL — NAPROXEN 500 MG TABLET: 500 | 30 days supply | Qty: 60 | Fill #0

## 2016-05-14 MED FILL — BUPROPION SR 150 MG TABLET: 150 | 30 days supply | Qty: 30 | Fill #5

## 2016-05-21 MED FILL — diazePAM 2 MG TABS: 2 | 30 days supply | Qty: 30 | Fill #0

## 2016-06-01 MED FILL — NAPROXEN 500 MG TABLET: 500 | 90 days supply | Qty: 180 | Fill #0

## 2016-06-12 MED FILL — BUPROPION SR 150 MG TABLET: 150 | 30 days supply | Qty: 30 | Fill #6

## 2016-07-16 MED FILL — DONEPEZIL HCL 10 MG TABLET: 10 | 90 days supply | Qty: 90 | Fill #0

## 2016-07-18 MED FILL — BUPROPION SR 150 MG TABLET: 150 | 30 days supply | Qty: 30 | Fill #7

## 2016-07-20 MED FILL — LYRICA 25 MG CAPSULE: 25 | 7 days supply | Qty: 21 | Fill #0

## 2016-07-20 MED FILL — diazePAM 2 MG TABS: 2 | 30 days supply | Qty: 30 | Fill #0

## 2016-07-26 DIAGNOSIS — R4182 Altered mental status, unspecified: Secondary | ICD-10-CM | POA: Diagnosis not present

## 2016-07-27 DIAGNOSIS — R4182 Altered mental status, unspecified: Secondary | ICD-10-CM | POA: Diagnosis not present

## 2016-07-30 MED FILL — CIPROFLOXACIN HCL 250 MG TA: 250 | 7 days supply | Qty: 14 | Fill #0

## 2016-08-10 MED FILL — BUPROPION SR 150 MG TABLET: 150 | 30 days supply | Qty: 60 | Fill #0

## 2016-08-13 DIAGNOSIS — M2550 Pain in unspecified joint: Secondary | ICD-10-CM | POA: Diagnosis not present

## 2016-08-23 ENCOUNTER — Emergency Department (HOSPITAL_COMMUNITY): Payer: PPO

## 2016-08-23 ENCOUNTER — Emergency Department (HOSPITAL_COMMUNITY)
Admission: EM | Admit: 2016-08-23 | Discharge: 2016-08-23 | Disposition: A | Discharge status: Home / Self Care | Payer: PPO | Attending: Emergency Medicine | Admitting: Emergency Medicine

## 2016-08-23 ENCOUNTER — Encounter (HOSPITAL_COMMUNITY): Payer: Self-pay

## 2016-08-23 DIAGNOSIS — W06XXXA Fall from bed, initial encounter: Secondary | ICD-10-CM | POA: Insufficient documentation

## 2016-08-23 DIAGNOSIS — S22000A Wedge compression fracture of unspecified thoracic vertebra, initial encounter for closed fracture: Secondary | ICD-10-CM

## 2016-08-23 DIAGNOSIS — Y9384 Activity, sleeping: Secondary | ICD-10-CM | POA: Diagnosis not present

## 2016-08-23 DIAGNOSIS — S22050A Wedge compression fracture of T5-T6 vertebra, initial encounter for closed fracture: Secondary | ICD-10-CM | POA: Diagnosis not present

## 2016-08-23 DIAGNOSIS — I10 Essential (primary) hypertension: Secondary | ICD-10-CM | POA: Insufficient documentation

## 2016-08-23 DIAGNOSIS — Y92003 Bedroom of unspecified non-institutional (private) residence as the place of occurrence of the external cause: Secondary | ICD-10-CM | POA: Diagnosis not present

## 2016-08-23 DIAGNOSIS — R0789 Other chest pain: Secondary | ICD-10-CM | POA: Diagnosis not present

## 2016-08-23 DIAGNOSIS — S22080A Wedge compression fracture of T11-T12 vertebra, initial encounter for closed fracture: Secondary | ICD-10-CM | POA: Diagnosis not present

## 2016-08-23 DIAGNOSIS — Y999 Unspecified external cause status: Secondary | ICD-10-CM | POA: Diagnosis not present

## 2016-08-23 DIAGNOSIS — Z79899 Other long term (current) drug therapy: Secondary | ICD-10-CM | POA: Diagnosis not present

## 2016-08-23 DIAGNOSIS — S22060A Wedge compression fracture of T7-T8 vertebra, initial encounter for closed fracture: Secondary | ICD-10-CM | POA: Insufficient documentation

## 2016-08-23 DIAGNOSIS — R079 Chest pain, unspecified: Secondary | ICD-10-CM | POA: Diagnosis not present

## 2016-08-23 DIAGNOSIS — F0390 Unspecified dementia without behavioral disturbance: Secondary | ICD-10-CM | POA: Insufficient documentation

## 2016-08-23 HISTORY — DX: Unspecified dementia, unspecified severity, without behavioral disturbance, psychotic disturbance, mood disturbance, and anxiety: F03.90

## 2016-08-23 LAB — URINALYSIS, ROUTINE W REFLEX MICROSCOPIC
Bilirubin Urine: NEGATIVE
GLUCOSE, UA: NEGATIVE mg/dL
HGB URINE DIPSTICK: NEGATIVE
Ketones, ur: 5 mg/dL — AB
Nitrite: NEGATIVE
PROTEIN: NEGATIVE mg/dL
Specific Gravity, Urine: 1.004 — ABNORMAL LOW (ref 1.005–1.030)
pH: 6 (ref 5.0–8.0)

## 2016-08-23 MED ORDER — HYDROCODONE-ACETAMINOPHEN 5-325 MG PO TABS
0.5000 | ORAL_TABLET | Freq: Once | ORAL | Status: AC
Start: 2016-08-23 — End: 2016-08-23
  Administered 2016-08-23: 0.5 via ORAL
  Filled 2016-08-23: qty 1

## 2016-08-23 MED ORDER — TRAMADOL 5 MG/ML ORAL SUSPENSION: 15.0000 mg | Freq: Once | ORAL | Status: DC

## 2016-08-23 NOTE — ED Provider Notes (Signed)
[facility]-EMERGENCY DEPT Provider Note   CSN: [id number] Arrival date & time: 08/23/16  0756     History   Chief Complaint Chief Complaint  Patient presents with  . Chest Pain    HPI Nancy Meyer is a 83 y.o. female.  HPI  83 year old female with a history of dementia, osteoporosis, prior lumbar compression fractures who presents to the ED with 10 days of chest wall pain. There is a possibility that patient fell 10 days ago. Family reports that the patient stated that she had fallen between her bedside commode in her bed. When they went in to see her the patient was already on the bed, the bedside commode was upright, however they did note that there was some urine on the floor. The patient has been at her baseline mental status since. No focal deficits.   Remainder of history, ROS, and physical exam limited due to patient's condition (dementia). Additional information was obtained from family.   Level V Caveat.   Past Medical History:  Diagnosis Date  . Arthritis   . Dementia   . Hyperlipidemia   . Hypertension   . Macular degeneration   . Osteoporosis     Patient Active Problem List   Diagnosis Date Noted  . Rib fractures 01/17/2014  . Acute encephalopathy 01/17/2014  . Protein-calorie malnutrition, severe (HCC) 12/15/2013  . Compression fracture of L1 lumbar vertebra (HCC) 12/15/2013  . Hypokalemia 12/15/2013  . Constipation 12/14/2013  . Nausea alone 12/04/2013  . Depression 11/06/2013  . Unspecified constipation 11/06/2013  . Compression fracture of L4 lumbar vertebra (HCC) 11/05/2013  . Fracture of vertebra 11/03/2013  . Inguinal hernia, right. 08/09/2011  . Hyperglycemia 03/01/2011  . Seasonal allergies 03/01/2011  . Hyponatremia 02/28/2011  . Normocytic anemia 02/28/2011  . HTN (hypertension) 02/27/2011  . Diskitis 02/27/2011  . Cataract 02/27/2011  . Hyperlipidemia 02/27/2011  . Osteoporosis     Past Surgical History:  Procedure Laterality  Date  . APPENDECTOMY    . BACK SURGERY    . EYE SURGERY      OB History    Gravida Para Term Preterm AB Living   3 3 3          SAB TAB Ectopic Multiple Live Births                   Home Medications    Prior to Admission medications   Medication Sig Start Date End Date Taking? Authorizing Provider  acetaminophen (TYLENOL) 650 MG CR tablet Take 650-1,300 mg by mouth every 8 (eight) hours as needed for pain.    Yes [provider]  buPROPion (WELLBUTRIN SR) 150 MG 12 hr tablet Take 150 mg by mouth 2 (two) times daily.    Yes [provider]  calcium citrate-vitamin D (CITRACAL+D) 315-200 MG-UNIT tablet Take 1 tablet by mouth daily with breakfast.   Yes [provider]  cholecalciferol (VITAMIN D) 1000 units tablet Take 1,000 Units by mouth daily with breakfast.   Yes [provider]  diazepam (VALIUM) 2 MG tablet Take 2 mg by mouth at bedtime.   Yes [provider]  docusate sodium (COLACE) 100 MG capsule Take 100 mg by mouth at bedtime.   Yes [provider]  donepezil (ARICEPT) 5 MG tablet Take 5 mg by mouth at bedtime.   Yes [provider]  lactulose (CHRONULAC) 10 GM/15ML solution Take 30 mLs (20 g total) by mouth 2 (two) times daily as needed for moderate constipation.  12/19/13  Yes Jeralyn Bennett, MD  loratadine (CLARITIN) 10 MG tablet Take 10 mg by mouth daily with supper.   Yes [provider]  losartan (COZAAR) 100 MG tablet Take 100 mg by mouth daily with breakfast.  07/12/11  Yes [provider]  Melatonin 3 MG TABS Take 3 mg by mouth at bedtime.    Yes [provider]  Multiple Vitamins-Minerals (OCUVITE PRESERVISION PO) Take 1 tablet by mouth 2 (two) times daily.   Yes [provider]  naproxen (NAPROSYN) 500 MG tablet Take 500 mg by mouth 2 (two) times daily.   Yes [provider]  ondansetron (ZOFRAN) 4 MG tablet Take 4 mg by mouth every 8 (eight) hours as needed  for nausea or vomiting.   Yes [provider]  polyethylene glycol (CLEARLAX) packet Take 17 g by mouth daily as needed for mild constipation.   Yes [provider]  pregabalin (LYRICA) 25 MG capsule Take 25-50 mg by mouth 3 (three) times daily. Take 25 mg in the morning and at lunch, then 50 mg at bedtime   Yes [provider]  psyllium (METAMUCIL) 58.6 % packet Take 1 packet by mouth daily with breakfast.   Yes [provider]  Simethicone 125 MG TABS Take 1 tablet by mouth every 6 (six) hours as needed (for gas).   Yes [provider]  traMADol (ULTRAM) 50 MG tablet Take 25 mg by mouth daily with breakfast.    Yes [provider]  ALPRAZolam (XANAX) 0.25 MG tablet Take 1 tablet (0.25 mg total) by mouth every evening. Patient not taking: Reported on 08-13-202018 01/19/14   Jerald Kief, MD    Family History Family History  Problem Relation Age of Onset  . Cancer Daughter        breast    Social History Social History  Substance Use Topics  . Smoking status: Never Smoker  . Smokeless tobacco: Never Used  . Alcohol use No     Allergies   Macrobid [nitrofurantoin]   Review of Systems Review of Systems All other systems are reviewed and are negative for acute change except as noted in the HPI   Physical Exam Updated Vital Signs BP (!) 145/74 (BP Location: Left Arm)   Pulse 87   Temp 97.7 F (36.5 C) (Oral)   Resp 18   Ht 4\' 10"  (1.473 m)   Wt 38.6 kg (85 lb)   SpO2 96%   BMI 17.77 kg/m   Physical Exam  Constitutional: She appears well-developed and well-nourished. No distress.  HENT:  Head: Normocephalic and atraumatic.  Nose: Nose normal.  Eyes: Conjunctivae and EOM are normal. Pupils are equal, round, and reactive to light. Right eye exhibits no discharge. Left eye exhibits no discharge. No scleral icterus.  Neck: Normal range of motion. Neck supple.  Cardiovascular: Normal rate and regular rhythm.  Exam  reveals no gallop and no friction rub.   No murmur heard. Pulmonary/Chest: Effort normal and breath sounds normal. No stridor. No respiratory distress. She has no rales. She exhibits tenderness.    Abdominal: Soft. She exhibits no distension. There is no tenderness.  Musculoskeletal: She exhibits no edema.       Cervical back: She exhibits no bony tenderness.       Thoracic back: She exhibits tenderness. She exhibits no bony tenderness.       Lumbar back: She exhibits no bony tenderness.       Back:  Neurological: She is  alert. She is disoriented.  Happily demented  Skin: Skin is warm and dry. No rash noted. She is not diaphoretic. No erythema.  Psychiatric: She has a normal mood and affect.  Vitals reviewed.    ED Treatments / Results  Labs (all labs ordered are listed, but only abnormal results are displayed) Labs Reviewed  URINALYSIS, ROUTINE W REFLEX MICROSCOPIC    EKG  EKG Interpretation None       Radiology Dg Chest 2 View  Result Date: May 11, 202018 CLINICAL DATA:  Generalized chest pain ; history of osteoporosis and kyphosis. EXAM: CHEST  2 VIEW COMPARISON:  Chest x-ray of January 17, 2014 FINDINGS: The lungs are adequately inflated. There is no focal infiltrate. There is stable scarring in the lower third of the right lung. There is no pleural effusion. The heart and pulmonary vascularity are normal. The mediastinum is normal in width. There is calcification in the wall of the thoracic aorta. There is moderate curvature convex toward the left centered at the thoracolumbar junction. The patient has undergone previous kyphoplasty at approximately T12 and L1. There is wedge compression of T7 and T11. IMPRESSION: New 60% compressions of the bodies of approximately T7 and T11 since the previous chest x-ray and thoracic spine series in 2015. Previous kyphoplasties at approximately T12 and L1. No acute cardiopulmonary abnormality. Thoracic aortic atherosclerosis. Electronically  Signed   By: David  [place] M.D.   On: 0May 11, 202018 08:45    Procedures Procedures (including critical care time)  Medications Ordered in ED Medications  HYDROcodone-acetaminophen (NORCO/VICODIN) 5-325 MG per tablet 0.5 tablet (0.5 tablets Oral Given 08/23/16 0946)     Initial Impression / Assessment and Plan / ED Course  I have reviewed the triage vital signs and the nursing notes.  Pertinent labs & imaging results that were available during my care of the patient were reviewed by me and considered in my medical decision making (see chart for details).     Chest x-ray with new T7 and T11 compression fractures. This is new from 47. Unsure of the acuity, or whether this were related to her recent fall. Has been no change in her mental status or ambulation since. Patient had prior kyphoplasty by Dr. Durenda Age whom I contacted and will see patient in the clinic for follow up.  Pain controlled with 2.5mg  norco. Recommended folllow up with PCP further pain management.  The patient is safe for discharge with strict return precautions.   Final Clinical Impressions(s) / ED Diagnoses   Final diagnoses:  Chest wall pain  Closed compression fracture of thoracic vertebra, initial encounter [facility])   Disposition: Discharge  Condition: Good  I have discussed the results, Dx and Tx plan with the patient's family who expressed understanding and agree(s) with the plan. Discharge instructions discussed at great length. The patient's family was given strict return precautions who verbalized understanding of the instructions. No further questions at time of discharge.    New Prescriptions   No medications on file    Follow Up: Julieanne Cotton, MD [address] [place] [address] [phone]  Call  in 3-5 days if you're not contacted to schedule a follow-up appointment.  Pearson Grippe, MD [address] [place] [address] [phone]   For continued  pain management. Inquire about palliative care referral for further pain control management      Kelina Beauchamp, Amadeo Garnet, MD 08/23/16 1128

## 2016-08-23 NOTE — ED Notes (Addendum)
Patient is alert and oriented x3.  She was given DC instructions and follow up visit instructions.  Patient gave verbal understanding. She was DC via wheelchair to home.  V/S stable.  He was not showing any signs of distress on DC 

## 2016-08-23 NOTE — ED Triage Notes (Signed)
Patient's son reports that the patient had a fall approx 10 days ago and was c/o abdominal pain. Today at 0400 the patient c/o chest pain. Patient has  a history of dementia. No c/o SOB. No chest pain when palpated or with movement.

## 2016-08-27 ENCOUNTER — Emergency Department (HOSPITAL_COMMUNITY): Payer: PPO

## 2016-08-27 ENCOUNTER — Inpatient Hospital Stay (HOSPITAL_COMMUNITY)
Admission: EM | Admit: 2016-08-27 | Discharge: 2016-08-31 | DRG: 189 | Disposition: A | Discharge status: Skilled Nursing Facility | Payer: PPO | Attending: Internal Medicine | Admitting: Internal Medicine

## 2016-08-27 ENCOUNTER — Observation Stay (HOSPITAL_COMMUNITY): Payer: PPO

## 2016-08-27 ENCOUNTER — Encounter (HOSPITAL_COMMUNITY): Payer: Self-pay | Admitting: Oncology

## 2016-08-27 DIAGNOSIS — F0390 Unspecified dementia without behavioral disturbance: Secondary | ICD-10-CM | POA: Diagnosis present

## 2016-08-27 DIAGNOSIS — E876 Hypokalemia: Secondary | ICD-10-CM | POA: Diagnosis present

## 2016-08-27 DIAGNOSIS — R531 Weakness: Secondary | ICD-10-CM | POA: Diagnosis not present

## 2016-08-27 DIAGNOSIS — E440 Moderate protein-calorie malnutrition: Secondary | ICD-10-CM | POA: Insufficient documentation

## 2016-08-27 DIAGNOSIS — Z9181 History of falling: Secondary | ICD-10-CM

## 2016-08-27 DIAGNOSIS — M810 Age-related osteoporosis without current pathological fracture: Secondary | ICD-10-CM | POA: Diagnosis present

## 2016-08-27 DIAGNOSIS — E871 Hypo-osmolality and hyponatremia: Secondary | ICD-10-CM | POA: Diagnosis present

## 2016-08-27 DIAGNOSIS — D638 Anemia in other chronic diseases classified elsewhere: Secondary | ICD-10-CM | POA: Diagnosis present

## 2016-08-27 DIAGNOSIS — S22080A Wedge compression fracture of T11-T12 vertebra, initial encounter for closed fracture: Secondary | ICD-10-CM | POA: Diagnosis present

## 2016-08-27 DIAGNOSIS — F32A Depression, unspecified: Secondary | ICD-10-CM | POA: Diagnosis present

## 2016-08-27 DIAGNOSIS — I10 Essential (primary) hypertension: Secondary | ICD-10-CM | POA: Diagnosis present

## 2016-08-27 DIAGNOSIS — F419 Anxiety disorder, unspecified: Secondary | ICD-10-CM | POA: Diagnosis not present

## 2016-08-27 DIAGNOSIS — R338 Other retention of urine: Secondary | ICD-10-CM | POA: Diagnosis not present

## 2016-08-27 DIAGNOSIS — F329 Major depressive disorder, single episode, unspecified: Secondary | ICD-10-CM | POA: Diagnosis present

## 2016-08-27 DIAGNOSIS — Z66 Do not resuscitate: Secondary | ICD-10-CM | POA: Diagnosis present

## 2016-08-27 DIAGNOSIS — W19XXXA Unspecified fall, initial encounter: Secondary | ICD-10-CM | POA: Diagnosis present

## 2016-08-27 DIAGNOSIS — S22000A Wedge compression fracture of unspecified thoracic vertebra, initial encounter for closed fracture: Secondary | ICD-10-CM

## 2016-08-27 DIAGNOSIS — M25551 Pain in right hip: Secondary | ICD-10-CM | POA: Diagnosis not present

## 2016-08-27 DIAGNOSIS — E785 Hyperlipidemia, unspecified: Secondary | ICD-10-CM | POA: Diagnosis not present

## 2016-08-27 DIAGNOSIS — D649 Anemia, unspecified: Secondary | ICD-10-CM | POA: Diagnosis not present

## 2016-08-27 DIAGNOSIS — R0789 Other chest pain: Secondary | ICD-10-CM | POA: Diagnosis present

## 2016-08-27 DIAGNOSIS — M4854XA Collapsed vertebra, not elsewhere classified, thoracic region, initial encounter for fracture: Secondary | ICD-10-CM | POA: Diagnosis not present

## 2016-08-27 DIAGNOSIS — R1111 Vomiting without nausea: Secondary | ICD-10-CM | POA: Diagnosis not present

## 2016-08-27 DIAGNOSIS — S22060D Wedge compression fracture of T7-T8 vertebra, subsequent encounter for fracture with routine healing: Secondary | ICD-10-CM | POA: Diagnosis not present

## 2016-08-27 DIAGNOSIS — S22080D Wedge compression fracture of T11-T12 vertebra, subsequent encounter for fracture with routine healing: Secondary | ICD-10-CM | POA: Diagnosis not present

## 2016-08-27 DIAGNOSIS — R1084 Generalized abdominal pain: Secondary | ICD-10-CM | POA: Diagnosis not present

## 2016-08-27 DIAGNOSIS — R109 Unspecified abdominal pain: Secondary | ICD-10-CM | POA: Diagnosis not present

## 2016-08-27 DIAGNOSIS — R339 Retention of urine, unspecified: Secondary | ICD-10-CM | POA: Diagnosis not present

## 2016-08-27 DIAGNOSIS — R0902 Hypoxemia: Secondary | ICD-10-CM

## 2016-08-27 DIAGNOSIS — E618 Deficiency of other specified nutrient elements: Secondary | ICD-10-CM | POA: Diagnosis not present

## 2016-08-27 DIAGNOSIS — J9811 Atelectasis: Secondary | ICD-10-CM | POA: Diagnosis present

## 2016-08-27 DIAGNOSIS — J9601 Acute respiratory failure with hypoxia: Secondary | ICD-10-CM | POA: Diagnosis not present

## 2016-08-27 DIAGNOSIS — Z883 Allergy status to other anti-infective agents status: Secondary | ICD-10-CM

## 2016-08-27 DIAGNOSIS — R112 Nausea with vomiting, unspecified: Secondary | ICD-10-CM | POA: Diagnosis not present

## 2016-08-27 DIAGNOSIS — H3530 Unspecified macular degeneration: Secondary | ICD-10-CM | POA: Diagnosis not present

## 2016-08-27 DIAGNOSIS — S22060A Wedge compression fracture of T7-T8 vertebra, initial encounter for closed fracture: Secondary | ICD-10-CM | POA: Diagnosis present

## 2016-08-27 DIAGNOSIS — K5903 Drug induced constipation: Secondary | ICD-10-CM | POA: Diagnosis present

## 2016-08-27 DIAGNOSIS — K5900 Constipation, unspecified: Secondary | ICD-10-CM | POA: Diagnosis present

## 2016-08-27 DIAGNOSIS — Z79899 Other long term (current) drug therapy: Secondary | ICD-10-CM

## 2016-08-27 DIAGNOSIS — K5901 Slow transit constipation: Secondary | ICD-10-CM

## 2016-08-27 DIAGNOSIS — S22069A Unspecified fracture of T7-T8 vertebra, initial encounter for closed fracture: Secondary | ICD-10-CM | POA: Diagnosis present

## 2016-08-27 DIAGNOSIS — G8929 Other chronic pain: Secondary | ICD-10-CM | POA: Diagnosis present

## 2016-08-27 DIAGNOSIS — R079 Chest pain, unspecified: Secondary | ICD-10-CM

## 2016-08-27 DIAGNOSIS — E878 Other disorders of electrolyte and fluid balance, not elsewhere classified: Secondary | ICD-10-CM

## 2016-08-27 DIAGNOSIS — R52 Pain, unspecified: Secondary | ICD-10-CM

## 2016-08-27 DIAGNOSIS — E43 Unspecified severe protein-calorie malnutrition: Secondary | ICD-10-CM | POA: Diagnosis not present

## 2016-08-27 HISTORY — DX: Anxiety disorder, unspecified: F41.9

## 2016-08-27 HISTORY — DX: Fracture of neck, unspecified, initial encounter: S12.9XXA

## 2016-08-27 HISTORY — DX: Depression, unspecified: F32.A

## 2016-08-27 HISTORY — DX: Other chronic pain: G89.29

## 2016-08-27 HISTORY — DX: Major depressive disorder, single episode, unspecified: F32.9

## 2016-08-27 LAB — COMPREHENSIVE METABOLIC PANEL
ALT: 14 U/L (ref 14–54)
AST: 24 U/L (ref 15–41)
Albumin: 3.4 g/dL — ABNORMAL LOW (ref 3.5–5.0)
Alkaline Phosphatase: 80 U/L (ref 38–126)
Anion gap: 11 (ref 5–15)
BILIRUBIN TOTAL: 0.7 mg/dL (ref 0.3–1.2)
BUN: 15 mg/dL (ref 6–20)
CALCIUM: 9.3 mg/dL (ref 8.9–10.3)
CO2: 27 mmol/L (ref 22–32)
CREATININE: 0.79 mg/dL (ref 0.44–1.00)
Chloride: 90 mmol/L — ABNORMAL LOW (ref 101–111)
Glucose, Bld: 98 mg/dL (ref 65–99)
Potassium: 3.8 mmol/L (ref 3.5–5.1)
Sodium: 128 mmol/L — ABNORMAL LOW (ref 135–145)
TOTAL PROTEIN: 6 g/dL — AB (ref 6.5–8.1)

## 2016-08-27 LAB — URINALYSIS, ROUTINE W REFLEX MICROSCOPIC
Bilirubin Urine: NEGATIVE
GLUCOSE, UA: NEGATIVE mg/dL
Hgb urine dipstick: NEGATIVE
KETONES UR: 5 mg/dL — AB
LEUKOCYTES UA: NEGATIVE
NITRITE: NEGATIVE
PROTEIN: NEGATIVE mg/dL
Specific Gravity, Urine: 1.008 (ref 1.005–1.030)
pH: 6 (ref 5.0–8.0)

## 2016-08-27 LAB — CBC
HCT: 32.1 % — ABNORMAL LOW (ref 36.0–46.0)
Hemoglobin: 11.4 g/dL — ABNORMAL LOW (ref 12.0–15.0)
MCH: 32.2 pg (ref 26.0–34.0)
MCHC: 35.5 g/dL (ref 30.0–36.0)
MCV: 90.7 fL (ref 78.0–100.0)
PLATELETS: 465 10*3/uL — AB (ref 150–400)
RBC: 3.54 MIL/uL — AB (ref 3.87–5.11)
RDW: 13 % (ref 11.5–15.5)
WBC: 9.8 10*3/uL (ref 4.0–10.5)

## 2016-08-27 LAB — LIPASE, BLOOD: Lipase: 20 U/L (ref 11–51)

## 2016-08-27 MED ORDER — SODIUM CHLORIDE 0.9 % IV SOLN
INTRAVENOUS | Status: DC
  Administered 2016-08-27 – 2016-08-28 (×2): via INTRAVENOUS

## 2016-08-27 MED ORDER — ACETAMINOPHEN 500 MG PO TABS
1000.0000 mg | ORAL_TABLET | Freq: Three times a day (TID) | ORAL | Status: DC
  Administered 2016-08-27 – 2016-08-31 (×12): 1000 mg via ORAL
  Filled 2016-08-27 (×12): qty 2

## 2016-08-27 MED ORDER — DONEPEZIL HCL 10 MG PO TABS
10.0000 mg | ORAL_TABLET | Freq: Every day | ORAL | Status: DC
  Administered 2016-08-27 – 2016-08-30 (×4): 10 mg via ORAL
  Filled 2016-08-27 (×4): qty 1

## 2016-08-27 MED ORDER — SENNA 8.6 MG PO TABS
1.0000 | ORAL_TABLET | Freq: Two times a day (BID) | ORAL | Status: DC
Start: 2016-08-27 — End: 2016-08-28
  Administered 2016-08-27 (×2): 8.6 mg via ORAL
  Filled 2016-08-27 (×2): qty 1

## 2016-08-27 MED ORDER — BUPROPION HCL ER (SR) 150 MG PO TB12
150.0000 mg | ORAL_TABLET | Freq: Two times a day (BID) | ORAL | Status: DC
  Administered 2016-08-27 – 2016-08-31 (×9): 150 mg via ORAL
  Filled 2016-08-27 (×9): qty 1

## 2016-08-27 MED ORDER — PREGABALIN 50 MG PO CAPS
50.0000 mg | ORAL_CAPSULE | Freq: Every day | ORAL | Status: DC
  Administered 2016-08-27 – 2016-08-30 (×4): 50 mg via ORAL
  Filled 2016-08-27 (×4): qty 1

## 2016-08-27 MED ORDER — BOOST / RESOURCE BREEZE PO LIQD
1.0000 | Freq: Three times a day (TID) | ORAL | Status: DC
  Administered 2016-08-27 – 2016-08-31 (×11): 1 via ORAL

## 2016-08-27 MED ORDER — ALUM & MAG HYDROXIDE-SIMETH 200-200-20 MG/5ML PO SUSP
15.0000 mL | ORAL | Status: DC | PRN
  Administered 2016-08-27 – 2016-08-29 (×3): 15 mL via ORAL
  Filled 2016-08-27 (×3): qty 30

## 2016-08-27 MED ORDER — FLEET ENEMA 7-19 GM/118ML RE ENEM
1.0000 | ENEMA | Freq: Once | RECTAL | Status: AC
  Administered 2016-08-27: 1 via RECTAL
  Filled 2016-08-27: qty 1

## 2016-08-27 MED ORDER — KETOROLAC TROMETHAMINE 15 MG/ML IJ SOLN
7.5000 mg | Freq: Four times a day (QID) | INTRAMUSCULAR | Status: DC | PRN
  Administered 2016-08-27 – 2016-08-28 (×2): 7.5 mg via INTRAVENOUS
  Filled 2016-08-27 (×2): qty 1

## 2016-08-27 MED ORDER — ENOXAPARIN SODIUM 30 MG/0.3ML ~~LOC~~ SOLN
30.0000 mg | SUBCUTANEOUS | Status: DC
  Administered 2016-08-27: 30 mg via SUBCUTANEOUS
  Filled 2016-08-27: qty 0.3

## 2016-08-27 MED ORDER — FENTANYL CITRATE (PF) 100 MCG/2ML IJ SOLN
25.0000 ug | Freq: Once | INTRAMUSCULAR | Status: AC
  Administered 2016-08-27: 25 ug via INTRAVENOUS
  Filled 2016-08-27: qty 2

## 2016-08-27 MED ORDER — DIBUCAINE 1 % RE OINT
TOPICAL_OINTMENT | RECTAL | Status: DC | PRN
  Filled 2016-08-27: qty 28

## 2016-08-27 MED ORDER — SODIUM CHLORIDE 0.9 % IV BOLUS (SEPSIS)
1000.0000 mL | Freq: Once | INTRAVENOUS | Status: AC
  Administered 2016-08-27: 1000 mL via INTRAVENOUS

## 2016-08-27 MED ORDER — PREGABALIN 25 MG PO CAPS
25.0000 mg | ORAL_CAPSULE | Freq: Two times a day (BID) | ORAL | Status: DC
  Administered 2016-08-27 – 2016-08-31 (×9): 25 mg via ORAL
  Filled 2016-08-27 (×9): qty 1

## 2016-08-27 MED ORDER — DOCUSATE SODIUM 100 MG PO CAPS
200.0000 mg | ORAL_CAPSULE | Freq: Two times a day (BID) | ORAL | Status: DC
  Administered 2016-08-27 – 2016-08-30 (×7): 200 mg via ORAL
  Filled 2016-08-27 (×8): qty 2

## 2016-08-27 MED ORDER — LOSARTAN POTASSIUM 50 MG PO TABS
100.0000 mg | ORAL_TABLET | Freq: Every day | ORAL | Status: DC
  Administered 2016-08-28 – 2016-08-31 (×4): 100 mg via ORAL
  Filled 2016-08-27 (×4): qty 2

## 2016-08-27 MED ORDER — KETOROLAC TROMETHAMINE 15 MG/ML IJ SOLN
7.5000 mg | Freq: Once | INTRAMUSCULAR | Status: AC
  Administered 2016-08-27: 7.5 mg via INTRAVENOUS
  Filled 2016-08-27: qty 1

## 2016-08-27 MED ORDER — POLYETHYLENE GLYCOL 3350 17 G PO PACK
17.0000 g | PACK | Freq: Two times a day (BID) | ORAL | Status: DC
  Administered 2016-08-27 (×2): 17 g via ORAL
  Filled 2016-08-27 (×2): qty 1

## 2016-08-27 MED ORDER — DIAZEPAM 2 MG PO TABS
1.0000 mg | ORAL_TABLET | Freq: Every day | ORAL | Status: DC
  Administered 2016-08-27 – 2016-08-30 (×4): 1 mg via ORAL
  Filled 2016-08-27 (×4): qty 1

## 2016-08-27 MED ORDER — MELATONIN 3 MG PO TABS: 3.0000 mg | ORAL_TABLET | Freq: Every day | ORAL | Status: DC

## 2016-08-27 MED ORDER — MAGNESIUM CITRATE PO SOLN
0.5000 | Freq: Once | ORAL | Status: AC
  Administered 2016-08-27: 0.5 via ORAL
  Filled 2016-08-27: qty 296

## 2016-08-27 MED ORDER — TRAMADOL HCL 50 MG PO TABS
25.0000 mg | ORAL_TABLET | Freq: Two times a day (BID) | ORAL | Status: DC | PRN
  Administered 2016-08-27 – 2016-08-31 (×6): 25 mg via ORAL
  Filled 2016-08-27 (×6): qty 1

## 2016-08-27 NOTE — Evaluation (Signed)
Physical Therapy Evaluation Patient Details Name: Nancy Meyer MRN: [id number] DOB: Dec 25, 1920 Today's Date: 08/27/2016   History of Present Illness  Nancy Meyer is a 83 y.o. female with history of dementia, hypertension, chronic pain, anxiety and depression, osteoporosis and multiple compression fractures who presents with worsening abdominal pain.  Patient previously was able to ambulate with a rolling walker short distanced, but over the last couple of days has been in too much pain to get out of bed.  Clinical Impression  Pt admitted with above diagnosis. Pt currently with functional limitations due to the deficits listed below (see PT Problem List). * Pt will benefit from skilled PT to increase their independence and safety with mobility to allow discharge to the venue listed below.  Will follow in acute setting     Follow Up Recommendations Home health PT;Supervision/Assistance - 24 hour (if  family desire)    Equipment Recommendations  None recommended by PT    Recommendations for Other Services       Precautions / Restrictions Precautions Precautions: Fall      Mobility  Bed Mobility Overal bed mobility: Needs Assistance Bed Mobility: Sit to Supine     Supine to sit: Min guard Sit to supine: Min guard   General bed mobility comments: cues for  self assist   Transfers Overall transfer level: Needs assistance Equipment used: None;Rolling walker (2 wheeled) Transfers: Sit to/from [employer] Sit to Stand: Min assist Stand pivot transfers: Mod assist;Min assist       General transfer comment: cues for safety and hand placement  Ambulation/Gait             General Gait Details: pt declined attempts to amb  Stairs            Wheelchair Mobility    Modified Rankin (Stroke Patients Only)       Balance Overall balance assessment: Needs assistance Sitting-balance support: Bilateral upper extremity supported;Feet  supported Sitting balance-Leahy Scale: Fair       Standing balance-Leahy Scale: Poor Standing balance comment: requires UE support                             Pertinent Vitals/Pain Pain Assessment: No/denies pain    Home Living Family/patient expects to be discharged to:: Private residence Living Arrangements: Alone   Type of Home: House Home Access: Stairs to enter   Secretary/administrator of Steps: 5 Home Layout: One level Home Equipment: None      Prior Function Level of Independence: Independent         Comments: pt is unreliable historian, reports she is totally independent with everything; later states maybe someone stays with her     Hand Dominance        Extremity/Trunk Assessment   Upper Extremity Assessment Upper Extremity Assessment: Generalized weakness    Lower Extremity Assessment Lower Extremity Assessment: Generalized weakness    Cervical / Trunk Assessment Cervical / Trunk Assessment: Kyphotic;Other exceptions Cervical / Trunk Exceptions: unable to extend trunk in standing, remains flexed at hips near 90 degrees  Communication   Communication: No difficulties  Cognition Arousal/Alertness: Awake/alert Behavior During Therapy: WFL for tasks assessed/performed Overall Cognitive Status: History of cognitive impairments - at baseline  General Comments      Exercises     Assessment/Plan    PT Assessment Patient needs continued PT services  PT Problem List Decreased strength;Decreased activity tolerance;Decreased balance;Decreased mobility;Decreased knowledge of use of DME;Decreased safety awareness       PT Treatment Interventions DME instruction;Gait training;Functional mobility training;Therapeutic exercise;Therapeutic activities;Patient/family education    PT Goals (Current goals can be found in the Care Plan section)  Acute Rehab PT Goals Patient Stated Goal:  none stated PT Goal Formulation: Patient unable to participate in goal setting Time For Goal Achievement: 09/03/16 Potential to Achieve Goals: Good    Frequency Min 3X/week   Barriers to discharge        Co-evaluation               AM-PAC PT "6 Clicks" Daily Activity  Outcome Measure Difficulty turning over in bed (including adjusting bedclothes, sheets and blankets)?: A Little Difficulty moving from lying on back to sitting on the side of the bed? : A Little Difficulty sitting down on and standing up from a chair with arms (e.g., wheelchair, bedside commode, etc,.)?: A Little Help needed moving to and from a bed to chair (including a wheelchair)?: A Little Help needed walking in hospital room?: A Lot Help needed climbing 3-5 steps with a railing? : A Lot 6 Click Score: 16    End of Session   Activity Tolerance: Patient limited by fatigue Patient left: in bed;with call bell/phone within reach;with bed alarm set   PT Visit Diagnosis: Unsteadiness on feet (R26.81);Muscle weakness (generalized) (M62.81)    Time: [id number] PT Time Calculation (min) (ACUTE ONLY): 24 min   Charges:   PT Evaluation $PT Eval Low Complexity: 1 Procedure PT Treatments $Therapeutic Activity: 8-22 mins   PT G Codes:   PT G-Codes **NOT FOR INPATIENT CLASS** Functional Assessment Tool Used: AM-PAC 6 Clicks Basic Mobility Functional Limitation: Changing and maintaining body position Changing and Maintaining Body Position Current Status (N8295(G8981): At least 20 percent but less than 40 percent impaired, limited or restricted Changing and Maintaining Body Position Goal Status (A2130(G8982): At least 1 percent but less than 20 percent impaired, limited or restricted    Drucilla Chaletara Maryanne Huneycutt, PT Pager: [phone] 08/27/2016   Drucilla ChaletWILLIAMS,Young Mulvey 08/27/2016, 2:13 PM

## 2016-08-27 NOTE — ED Notes (Signed)
Pt's O2 levels variates between 77-98%. Pt observed and placed on 2L O2 via n/c. Will continue to monitor.

## 2016-08-27 NOTE — ED Triage Notes (Signed)
Pt bib [facility] from home d/t lower abdominal pain.  Pt has dementia and is at her baseline.  Per pt's son pt has been urinating frequently.

## 2016-08-27 NOTE — ED Notes (Signed)
Dr. Short at bedside. 

## 2016-08-27 NOTE — Discharge Instructions (Addendum)
Get miralax and put one dose or 17 g in 8 ounces of water,  take 1 dose every 30 minutes for 2-3 hours or until you  get good results and then once or twice daily to prevent constipation. Follow up with her PCP about her pain medications.

## 2016-08-27 NOTE — Clinical Social Work Note (Signed)
Clinical Social Work Assessment  Patient Details  Name: Nancy Meyer MRN: [id number] Date of Birth: 1920-08-15  Date of referral:  08/27/16               Reason for consult:  Facility Placement                Permission sought to share information with:  Facility Industrial/product designerContact Representative Permission granted to share information::  Yes, Verbal Permission Granted  Name::        Agency::     Relationship::     Contact Information:     Housing/Transportation Living arrangements for the past 2 months:  Single Family Home Source of Information:  Adult Children Nancy Meyer(Nancy Meyer ) Patient Interpreter Needed:  None Criminal Activity/Legal Involvement Pertinent to Current Situation/Hospitalization:    Significant Relationships:  Adult Children Lives with:  Self Do you feel safe going back to the place where you live?  No Need for family participation in patient care:  Yes (Comment)  Care giving concerns:  Patients son, Nancy NovemberMike, is unsure if patient will be able to DC back home. Wanting to see prognosis closer to DC.    Social Worker assessment / plan:  CSW spoke with patients son, Nancy NovemberMike, outside of room regarding discharge planning. Patient currently lives home alone and receives care 20 hours a day by private duty CNA's or family members. Patient is currently not oriented at this time. CSW explained PT order/ recommendation of HH/SNF if needed. Son is agreeable to SNF at this time but would [facility]. Son states patient has had negative experiences at other SNF's. Son reports the need for SNF if patient is not back to baseline by discharge.   Plan: CSW will complete FL2 and PASRR. If PT recommends SNF, CSW will follow up with bed offers.   Employment status:  Retired Database administratornsurance information:  Managed Medicare PT Recommendations:  Not assessed at this time Information / Referral to community resources:  Skilled Nursing Facility  Patient/Family's Response to care:  Patients son  appreciated CSW.   Patient/Family's Understanding of and Emotional Response to Diagnosis, Current Treatment, and Prognosis:  Son understands current diagnosis and treatment.   Emotional Assessment Appearance:  Appears stated age Attitude/Demeanor/Rapport:  Unable to Assess Affect (typically observed):  Unable to Assess Orientation:  Oriented to Self Alcohol / Substance use:    Psych involvement (Current and /or in the community):  No (Comment)  Discharge Needs  Concerns to be addressed:  No discharge needs identified Readmission within the last 30 days:  No Current discharge risk:  None Barriers to Discharge:  No Barriers Identified   Nancy Coffinrin M Nancy Hunkele, LCSW 08/27/2016, 2:15 PM

## 2016-08-27 NOTE — NC FL2 (Signed)
[place] MEDICAID FL2 LEVEL OF CARE SCREENING TOOL     IDENTIFICATION  Patient Name: Nancy JewelBarbara P Sykora Birthdate: 1920/11/03 Sex: female Admission Date (Current Location): 08/27/2016  [facility] [place]   Facility and Address:  [facility],  [address], [place] [id number]      Provider Number: [id number]  Attending Physician Name and Address:  Renae FickleShort, Mackenzie, MD  Relative Name and Phone Number:       Current Level of Care: Hospital Recommended Level of Care: Skilled Nursing Facility Prior Approval Number:    Date Approved/Denied:   PASRR Number:   [id number]   Discharge Plan: SNF    Current Diagnoses: Patient Active Problem List   Diagnosis Date Noted  . Acute respiratory failure with hypoxia (HCC) 08/27/2016  . Acute urinary retention 08/27/2016  . Rib fractures 01/17/2014  . Acute encephalopathy 01/17/2014  . Protein-calorie malnutrition, severe (HCC) 12/15/2013  . Compression fracture of L1 lumbar vertebra (HCC) 12/15/2013  . Hypokalemia 12/15/2013  . Constipation 12/14/2013  . Nausea alone 12/04/2013  . Depression 11/06/2013  . Unspecified constipation 11/06/2013  . Compression fracture of L4 lumbar vertebra (HCC) 11/05/2013  . Fracture of vertebra 11/03/2013  . Inguinal hernia, right. 08/09/2011  . Hyperglycemia 03/01/2011  . Seasonal allergies 03/01/2011  . Hyponatremia 02/28/2011  . Normocytic anemia 02/28/2011  . HTN (hypertension) 02/27/2011  . Diskitis 02/27/2011  . Cataract 02/27/2011  . Hyperlipidemia 02/27/2011  . Osteoporosis     Orientation RESPIRATION BLADDER Height & Weight     Self, Place, Situation  O2 (2 liters) Incontinent Weight:   Height:     BEHAVIORAL SYMPTOMS/MOOD NEUROLOGICAL BOWEL NUTRITION STATUS      Continent Diet (clear liquid )  AMBULATORY STATUS COMMUNICATION OF NEEDS Skin   Limited Assist Verbally Normal                       Personal Care Assistance Level of  Assistance  Bathing, Feeding, Dressing Bathing Assistance: Limited assistance Feeding assistance: Independent Dressing Assistance: Limited assistance     Functional Limitations Info             SPECIAL CARE FACTORS FREQUENCY  PT (By licensed PT), OT (By licensed OT)     PT Frequency: 5 OT Frequency: 5            Contractures      Additional Factors Info  Code Status, Allergies Code Status Info: DNR Allergies Info: Macrobid Nitrofurantoin           Current Medications (08/27/2016):  This is the current hospital active medication list Current Facility-Administered Medications  Medication Dose Route Frequency Provider Last Rate Last Dose  . 0.9 %  sodium chloride infusion   Intravenous Continuous Short, Mackenzie, MD 75 mL/hr at 08/27/16 1250    . acetaminophen (TYLENOL) tablet 1,000 mg  1,000 mg Oral TID Renae FickleShort, Mackenzie, MD      . buPROPion [facility](WELLBUTRIN SR) 12 hr tablet 150 mg  150 mg Oral BID Renae FickleShort, Mackenzie, MD   150 mg at 08/27/16 1355  . diazepam (VALIUM) tablet 1 mg  1 mg Oral QHS Short, Mackenzie, MD      . dibucaine (NUPERCAINAL) 1 % rectal ointment   Rectal PRN Short, Thea SilversmithMackenzie, MD      . docusate sodium (COLACE) capsule 200 mg  200 mg Oral BID Renae FickleShort, Mackenzie, MD   200 mg at 08/27/16 1355  . donepezil (ARICEPT) tablet 10 mg  10  mg Oral QHS Short, Mackenzie, MD      . enoxaparin (LOVENOX) injection 30 mg  30 mg Subcutaneous Q24H Short, Mackenzie, MD      . feeding supplement (BOOST / RESOURCE BREEZE) liquid 1 Container  1 Container Oral TID BM Renae Fickle, MD   1 Container at 08/27/16 1356  . ketorolac (TORADOL) 15 MG/ML injection 7.5 mg  7.5 mg Intravenous Q6H PRN Short, Thea Silversmith, MD      . Melene Muller ON 08/28/2016] losartan (COZAAR) tablet 100 mg  100 mg Oral Q breakfast Short, Mackenzie, MD      . polyethylene glycol (MIRALAX / GLYCOLAX) packet 17 g  17 g Oral BID Renae Fickle, MD   17 g at 08/27/16 1355  . pregabalin (LYRICA) capsule 25 mg  25 mg Oral  BID WC Renae Fickle, MD   25 mg at 08/27/16 1250  . pregabalin (LYRICA) capsule 50 mg  50 mg Oral QHS Short, Thea Silversmith, MD      . senna (SENOKOT) tablet 8.6 mg  1 tablet Oral BID Renae Fickle, MD   8.6 mg at 08/27/16 1355  . traMADol (ULTRAM) tablet 25 mg  25 mg Oral BID PRN Renae Fickle, MD         Discharge Medications: Please see discharge summary for a list of discharge medications.  Relevant Imaging Results:  Relevant Lab Results:   Additional Information SS#: [id number]  Donnie Coffin, LCSW

## 2016-08-27 NOTE — ED Notes (Signed)
Admitting MD still at bedside

## 2016-08-27 NOTE — ED Provider Notes (Addendum)
[facility]-EMERGENCY DEPT Provider Note   CSN: [id number] Arrival date & time: 08/27/16  0509  Time seen 05:20 AM   History   Chief Complaint Chief Complaint  Patient presents with  . Abdominal Pain   Level V caveat for dementia  HPI Nancy Meyer is a 83 y.o. female.  HPI  patient presents to the emergency department via EMS. She appears to be very uncomfortable and she is holding her legs flexed and rubbing her right hip complaining of pain. She then also rubs her abdomen. She denies nausea, vomiting, diarrhea. She states she's never had this pain before. However when I review her chart she has had multiple ED visits in 2012 and 2015 for back pain and abdominal pain. EMS reports the son states she's been urinating frequently.   PCP Kaleen Mask, MD   Past Medical History:  Diagnosis Date  . Arthritis   . Dementia   . Hyperlipidemia   . Hypertension   . Macular degeneration   . Osteoporosis     Patient Active Problem List   Diagnosis Date Noted  . Rib fractures 01/17/2014  . Acute encephalopathy 01/17/2014  . Protein-calorie malnutrition, severe (HCC) 12/15/2013  . Compression fracture of L1 lumbar vertebra (HCC) 12/15/2013  . Hypokalemia 12/15/2013  . Constipation 12/14/2013  . Nausea alone 12/04/2013  . Depression 11/06/2013  . Unspecified constipation 11/06/2013  . Compression fracture of L4 lumbar vertebra (HCC) 11/05/2013  . Fracture of vertebra 11/03/2013  . Inguinal hernia, right. 08/09/2011  . Hyperglycemia 03/01/2011  . Seasonal allergies 03/01/2011  . Hyponatremia 02/28/2011  . Normocytic anemia 02/28/2011  . HTN (hypertension) 02/27/2011  . Diskitis 02/27/2011  . Cataract 02/27/2011  . Hyperlipidemia 02/27/2011  . Osteoporosis     Past Surgical History:  Procedure Laterality Date  . APPENDECTOMY    . BACK SURGERY    . EYE SURGERY      OB History    Gravida Para Term Preterm AB Living   3 3 3          SAB TAB Ectopic Multiple Live  Births                   Home Medications    Prior to Admission medications   Medication Sig Start Date End Date Taking? Authorizing Provider  acetaminophen (TYLENOL) 650 MG CR tablet Take 650-1,300 mg by mouth every 8 (eight) hours as needed for pain.     [provider]  ALPRAZolam Prudy Feeler) 0.25 MG tablet Take 1 tablet (0.25 mg total) by mouth every evening. Patient not taking: Reported on 05-23-2016 01/19/14   Jerald Kief, MD  buPROPion [facility] SR) 150 MG 12 hr tablet Take 150 mg by mouth 2 (two) times daily.     [provider]  calcium citrate-vitamin D (CITRACAL+D) 315-200 MG-UNIT tablet Take 1 tablet by mouth daily with breakfast.    [provider]  cholecalciferol (VITAMIN D) 1000 units tablet Take 1,000 Units by mouth daily with breakfast.    [provider]  diazepam (VALIUM) 2 MG tablet Take 2 mg by mouth at bedtime.    [provider]  docusate sodium (COLACE) 100 MG capsule Take 100 mg by mouth at bedtime.    [provider]  donepezil (ARICEPT) 5 MG tablet Take 5 mg by mouth at bedtime.    [provider]  lactulose (CHRONULAC) 10 GM/15ML solution Take 30 mLs (20 g total) by mouth 2 (two) times daily as needed for  moderate constipation. 12/19/13   Jeralyn Bennett, MD  loratadine (CLARITIN) 10 MG tablet Take 10 mg by mouth daily with supper.    [provider]  losartan (COZAAR) 100 MG tablet Take 100 mg by mouth daily with breakfast.  07/12/11   [provider]  Melatonin 3 MG TABS Take 3 mg by mouth at bedtime.     [provider]  Multiple Vitamins-Minerals (OCUVITE PRESERVISION PO) Take 1 tablet by mouth 2 (two) times daily.    [provider]  naproxen (NAPROSYN) 500 MG tablet Take 500 mg by mouth 2 (two) times daily.    [provider]  ondansetron (ZOFRAN) 4 MG tablet Take 4 mg by mouth every 8 (eight) hours as needed for nausea or vomiting.    [provider]  polyethylene glycol (CLEARLAX) packet Take 17 g by mouth daily as needed for mild constipation.    [provider]  pregabalin (LYRICA) 25 MG capsule Take 25-50 mg by mouth 3 (three) times daily. Take 25 mg in the morning and at lunch, then 50 mg at bedtime    [provider]  psyllium (METAMUCIL) 58.6 % packet Take 1 packet by mouth daily with breakfast.    [provider]  Simethicone 125 MG TABS Take 1 tablet by mouth every 6 (six) hours as needed (for gas).    [provider]  traMADol (ULTRAM) 50 MG tablet Take 25 mg by mouth daily with breakfast.     [provider]    Family History Family History  Problem Relation Age of Onset  . Cancer Daughter        breast    Social History Social History  Substance Use Topics  . Smoking status: Never Smoker  . Smokeless tobacco: Never Used  . Alcohol use No     Allergies   Macrobid [nitrofurantoin]   Review of Systems Review of Systems  All other systems reviewed and are negative.    Physical Exam Updated Vital Signs BP (!) 153/75   Pulse 82   Temp 98.2 F (36.8 C) (Oral)   Resp 18   SpO2 95%   Vital signs normal except for hypertension   Physical Exam  Constitutional:  Non-toxic appearance. She does not appear ill. She appears distressed.  Frail elderly female who appears uncomfortable, she is holding her knees flexed and rubbing her right hip.  HENT:  Head: Normocephalic and atraumatic.  Right Ear: External ear normal.  Left Ear: External ear normal.  Nose: Nose normal. No mucosal edema or rhinorrhea.  Mouth/Throat: Oropharynx is clear and moist and mucous membranes are normal. No dental abscesses or uvula swelling.  Eyes: Conjunctivae and EOM are normal. Pupils are equal, round, and reactive to light.  Neck: Normal range of motion and full passive range of motion without pain. Neck supple.  Cardiovascular: Normal rate, regular rhythm and normal heart  sounds.  Exam reveals no gallop and no friction rub.   No murmur heard. Pulmonary/Chest: Effort normal and breath sounds normal. No respiratory distress. She has no wheezes. She has no rhonchi. She has no rales. She exhibits no tenderness and no crepitus.  Abdominal: Soft. Normal appearance and bowel sounds are normal. She exhibits no distension. There is tenderness. There is no rebound and no guarding.  Patient also has some tenderness to palpation in her right abdomen. There is no guarding or rebound.  Musculoskeletal: Normal range of motion. She exhibits no edema or tenderness.  Moves all  extremities well. Patient does not appear to have any pain to palpation in her lumbar spine.  Neurological: She is alert. She has normal strength. No cranial nerve deficit.  Patient follows commands  Skin: Skin is warm, dry and intact. No rash noted. No erythema. No pallor.  Psychiatric: She has a normal mood and affect. Her speech is normal and behavior is normal. Her mood appears not anxious.  Nursing note and vitals reviewed.    ED Treatments / Results  Labs (all labs ordered are listed, but only abnormal results are displayed) Results for orders placed or performed during the hospital encounter of 08/27/16  Lipase, blood  Result Value Ref Range   Lipase 20 11 - 51 U/L  Comprehensive metabolic panel  Result Value Ref Range   Sodium 128 (L) 135 - 145 mmol/L   Potassium 3.8 3.5 - 5.1 mmol/L   Chloride 90 (L) 101 - 111 mmol/L   CO2 27 22 - 32 mmol/L   Glucose, Bld 98 65 - 99 mg/dL   BUN 15 6 - 20 mg/dL   Creatinine, Ser 4.09 0.44 - 1.00 mg/dL   Calcium 9.3 8.9 - 81.1 mg/dL   Total Protein 6.0 (L) 6.5 - 8.1 g/dL   Albumin 3.4 (L) 3.5 - 5.0 g/dL   AST 24 15 - 41 U/L   ALT 14 14 - 54 U/L   Alkaline Phosphatase 80 38 - 126 U/L   Total Bilirubin 0.7 0.3 - 1.2 mg/dL   GFR calc non Af Amer >60 >60 mL/min   GFR calc Af Amer >60 >60 mL/min   Anion gap 11 5 - 15  CBC  Result Value Ref Range   WBC  9.8 4.0 - 10.5 K/uL   RBC 3.54 (L) 3.87 - 5.11 MIL/uL   Hemoglobin 11.4 (L) 12.0 - 15.0 g/dL   HCT 91.4 (L) 78.2 - 95.6 %   MCV 90.7 78.0 - 100.0 fL   MCH 32.2 26.0 - 34.0 pg   MCHC 35.5 30.0 - 36.0 g/dL   RDW 21.3 08.6 - 57.8 %   Platelets 465 (H) 150 - 400 K/uL  Urinalysis, Routine w reflex microscopic  Result Value Ref Range   Color, Urine YELLOW YELLOW   APPearance CLEAR CLEAR   Specific Gravity, Urine 1.008 1.005 - 1.030   pH 6.0 5.0 - 8.0   Glucose, UA NEGATIVE NEGATIVE mg/dL   Hgb urine dipstick NEGATIVE NEGATIVE   Bilirubin Urine NEGATIVE NEGATIVE   Ketones, ur 5 (A) NEGATIVE mg/dL   Protein, ur NEGATIVE NEGATIVE mg/dL   Nitrite NEGATIVE NEGATIVE   Leukocytes, UA NEGATIVE NEGATIVE   Laboratory interpretation all normal except hyponatremia, low chloride, mild low hemoglobin, malnutrition    EKG  EKG Interpretation None       Radiology Ct Renal Stone Study  Result Date: 08/27/2016 CLINICAL DATA:  Right lower quadrant pain with nausea and vomiting. Polyuria. EXAM: CT ABDOMEN AND PELVIS WITHOUT CONTRAST TECHNIQUE: Multidetector CT imaging of the abdomen and pelvis was performed following the standard protocol without IV contrast. COMPARISON:  1. MRI thoracic lumbar spine 01/04/2014 2. CT abdomen pelvis 12/14/2013 FINDINGS: Lower chest: Atelectasis/ scarring at the right lung base. Subsegmental of lower lobe atelectasis. Small pericardial effusion. Hepatobiliary: Normal noncontrast appearance of the liver. No visible biliary dilatation. Normal gallbladder. Pancreas: Normal noncontrast appearance of the pancreas. No peripancreatic fluid collection. Spleen: Normal. Adrenals/Urinary Tract: --Adrenal glands: Left adrenal gland is mildly enlarged but maintains its adreniform shape. --Right kidney/ureter: No hydronephrosis or  perinephric stranding. No nephrolithiasis. There is a small, intermediate attenuation focus near the upper pole that is likely a hemorrhagic cyst but is  technically too small to characterize accurately. --Left kidney/ureter: No hydronephrosis or perinephric stranding. No nephrolithiasis. No obstructing ureteral stones. --Urinary bladder: Unremarkable. Stomach/Bowel: No small bowel obstruction. By report, the appendix is surgically absent. There is rectosigmoid diverticulosis without acute inflammation. Vascular/Lymphatic: There is atherosclerotic calcification of the non aneurysmal abdominal aorta. No abdominal or pelvic lymphadenopathy. Reproductive: Normal uterus.  No adnexal mass. Musculoskeletal. There is extensive degenerative disease throughout the visualized spine. The patient is status post vertebral augmentation T12, L1 and L4. There is also a chronic compression fracture of T11. There are fractures of the left superior and inferior pubic rami that appear chronic. There is marked lumbar levoscoliosis. IMPRESSION: 1. No acute abdominal or pelvic abnormality. 2. Small pericardial effusion and aortic atherosclerosis. 3. Multiple chronic thoracolumbar compression fractures with prior multilevel vertebral augmentation. Electronically Signed   By: Deatra Robinson M.D.   On: 08/27/2016 06:24   Dg Hip Unilat With Pelvis Min 4 Views Right  Result Date: 08/27/2016 CLINICAL DATA:  Pain.  Patient reports right lower quadrant pain. EXAM: DG HIP (WITH OR WITHOUT PELVIS) 4+V RIGHT COMPARISON:  CT performed concurrently. FINDINGS: No evidence of acute fracture of the pelvis or right hip. Remote fracture of the left pubic body is better characterized on concurrent CT. Enthesopathic changes adjacent to the right hip greater trochanter. Both femoral heads are well-seated in the respective acetabula. The bones are under mineralized. Scoliosis in the included lumbar spine. IMPRESSION: 1. No evidence of acute osseous abnormality of the pelvis or right hip. 2. Enthesopathy of the right greater trochanter. Electronically Signed   By: Rubye Oaks M.D.   On: 08/27/2016 06:00     Procedures Procedures (including critical care time)  Medications Ordered in ED Medications  sodium phosphate (FLEET) 7-19 GM/118ML enema 1 enema (not administered)  fentaNYL (SUBLIMAZE) injection 25 mcg (25 mcg Intravenous Given 08/27/16 0537)  sodium chloride 0.9 % bolus 1,000 mL (1,000 mLs Intravenous New Bag/Given 08/27/16 0644)     Initial Impression / Assessment and Plan / ED Course  I have reviewed the triage vital signs and the nursing notes.  Pertinent labs & imaging results that were available during my care of the patient were reviewed by me and considered in my medical decision making (see chart for details).  Since her some problem with history with the patient having dementia CT of the abdomen was ordered and x-rays of her right hip.  6:10 AM patient's son, son-in-law, and daughter at bedside. They states she's been complaining of upper abdominal pain for the past 2 weeks ago but worse about 3:30 this morning. They states she's unable to change positions without worsening pain. Patient states when she moves she feels like something is ripping. She now holds her abdomen in the upper area which is different from what she was doing when I first saw her. Family states the right hip pain is an old complaint. CT scan is still pending.  6:55 AM I have reviewed patient's blood work with the family and we have reviewed her CT scan. When I look at her CT scan she has lots is stool throughout her small intestine and colon and in her rectal area. She was given a fleets enema in the ED. Also after reviewing her lab work her sodium and chloride were low probably dietary, she was given a eater of normal  saline IV fluids. Her urinalysis is still pending. She has been taking tramadol for pain. Pt reports her BM's have been like balls. Her PCP changed her pain medications last week, dropping back the ibuprofen.   UA is normal, without infection.   Family given her UA results, they now state  they want her to be admitted. Advised she doesn't meet admission criteria for pain control or constipation, but they are insistent that I try.   07:44 AM Dr Malachi BondsShort, will come talk to family  [place] site shows patient got #20 tramadol on May 21 from her PCP office, and #20 tramadol 50 mg tablets on April 5 otherwise she just gets #30 Valium 10 mg tablets about every 2 or 3 months and she received #270 Lyrica 25 mg tablets on April 27.  Final Clinical Impressions(s) / ED Diagnoses   Final diagnoses:  Pain  Generalized abdominal pain  Drug-induced constipation  Hyponatremia  Chloride, decreased level      Disposition pending Dr Malachi BondsShort.   Devoria AlbeIva Burl Tauzin, MD, Concha PyoFACEP    Jessicaann Overbaugh, MD 08/27/16 [id number]    Devoria AlbeKnapp, Rama Sorci, MD 08/28/16 [id number]

## 2016-08-27 NOTE — H&P (Addendum)
History and Physical    Nancy Meyer ZOX:[id number] DOB: Aug 18, 1920 DOA: 08/27/2016  Referring MD/NP/PA: Devoria Albe PCP: Kaleen Mask, MD   Patient coming from: home  Chief Complaint: abdominal pain  HPI: Nancy Meyer is a 83 y.o. female with history of dementia, hypertension, chronic pain, anxiety and depression, osteoporosis and multiple compression fractures who presents with worsening abdominal pain.  Patient previously was able to ambulate with a rolling walker Rafik Koppel distanced, but over the last couple of days has been in too much pain to get out of bed.  She had a fall two weeks ago which may have resulted in some new thoracic compression fractures.  She has been taking lyrica and tramadol for pain.  They gave her an enema a 1-2 weeks ago with good results, but since then she may not have been passing BMs.  Over the last two days, she has passed a few hard pellets.  One episode of emesis earlier this week, but none since although she has complained of nausea.  She has been drinking well according to son.  She has been complaining of right hip pains and back pains which are chronic but for the last two days, she has had intermittent severe upper abdominal pains which prevent her from getting to bedside commode or even sitting on a bedpain.  Worse with palpation and movement.  PCP felt pains were possibly due to NSAID gastritis and recommended stopping her naprosyn.  Her pain worsened. Denies fevers, always has chills, no sinus congestion, sore throat.  Having difficulty voiding today but usually voids frequently and do not feel that they are dehydrated.    ED Course: VS notable for only mildly elevated blood pressures and intermittent hypoxia to the low 70s on room air.  She was placed on 2L [place] and her O2 saturations increased to 90s.  Mild anemia and hyponatremia.  LFTs and lipase wnl.  CT scan ab/pelvis demonstrated copious stool with probable impaction.  Enema was ordered by  ER staff.  UA negative.  CXR is pending.  Son is an Charity fundraiser at [employer] and requesting hospital admission for pain control.  She was given fentanyl in ER, NS bolus.    Review of Systems:  Limited by patient's confusion General:  Denies fevers, chills, weight loss or gain HEENT:  Denies changes to hearing and vision, rhinorrhea, sinus congestion, sore throat CV:  Denies chest pain and palpitations, lower extremity edema.  PULM:  Denies SOB, wheezing, cough.   GI:  Per HPI GU:  Having urgency and frequency today but unable to void ENDO:  Denies polyuria, polydipsia.   HEME:  Denies hematemesis, blood in stools, melena, abnormal bruising or bleeding.  LYMPH:  Denies lymphadenopathy.   MSK:  Chronic arthralgias, myalgias.   DERM:  Denies skin rash or ulcer.   NEURO:  Denies focal numbness, weakness, slurred speech, confusion, facial droop.  PSYCH:  Chronic anxiety and depression.    Past Medical History:  Diagnosis Date  . Anxiety and depression   . Arthritis   . Chronic pain   . Compression fracture of C-spine (HCC)   . Dementia   . Hyperlipidemia   . Hypertension   . Macular degeneration   . Osteoporosis     Past Surgical History:  Procedure Laterality Date  . APPENDECTOMY    . BACK SURGERY     kyphoplasty  . EYE SURGERY       reports that she has never smoked. She has never  used smokeless tobacco. She reports that she does not drink alcohol or use drugs.  Allergies  Allergen Reactions  . Macrobid [Nitrofurantoin] Itching    Family History  Problem Relation Age of Onset  . Cancer Daughter        breast    Prior to Admission medications   Medication Sig Start Date End Date Taking? Authorizing Provider  acetaminophen (TYLENOL) 650 MG CR tablet Take 650-1,300 mg by mouth every 8 (eight) hours as needed for pain.     [provider]  ALPRAZolam Prudy Feeler(XANAX) 0.25 MG tablet Take 1 tablet (0.25 mg total) by mouth every evening. Patient not taking: Reported on August 14, 202018 01/19/14    Jerald Kiefhiu, Stephen K, MD  buPROPion [facility](WELLBUTRIN SR) 150 MG 12 hr tablet Take 150 mg by mouth 2 (two) times daily.     [provider]  calcium citrate-vitamin D (CITRACAL+D) 315-200 MG-UNIT tablet Take 1 tablet by mouth daily with breakfast.    [provider]  cholecalciferol (VITAMIN D) 1000 units tablet Take 1,000 Units by mouth daily with breakfast.    [provider]  diazepam (VALIUM) 2 MG tablet Take 2 mg by mouth at bedtime.    [provider]  docusate sodium (COLACE) 100 MG capsule Take 100 mg by mouth at bedtime.    [provider]  donepezil (ARICEPT) 5 MG tablet Take 5 mg by mouth at bedtime.    [provider]  lactulose (CHRONULAC) 10 GM/15ML solution Take 30 mLs (20 g total) by mouth 2 (two) times daily as needed for moderate constipation. 12/19/13   Jeralyn BennettZamora, Ezequiel, MD  loratadine (CLARITIN) 10 MG tablet Take 10 mg by mouth daily with supper.    [provider]  losartan (COZAAR) 100 MG tablet Take 100 mg by mouth daily with breakfast.  07/12/11   [provider]  Melatonin 3 MG TABS Take 3 mg by mouth at bedtime.     [provider]  Multiple Vitamins-Minerals (OCUVITE PRESERVISION PO) Take 1 tablet by mouth 2 (two) times daily.    [provider]  naproxen (NAPROSYN) 500 MG tablet Take 500 mg by mouth 2 (two) times daily.    [provider]  ondansetron (ZOFRAN) 4 MG tablet Take 4 mg by mouth every 8 (eight) hours as needed for nausea or vomiting.    [provider]  polyethylene glycol (CLEARLAX) packet Take 17 g by mouth daily as needed for mild constipation.    [provider]  pregabalin (LYRICA) 25 MG capsule Take 25-50 mg by mouth 3 (three) times daily. Take 25 mg in the morning and at lunch, then 50 mg at bedtime    [provider]  psyllium (METAMUCIL) 58.6 % packet Take 1 packet by mouth daily with breakfast.    [provider]  Simethicone  125 MG TABS Take 1 tablet by mouth every 6 (six) hours as needed (for gas).    [provider]  traMADol (ULTRAM) 50 MG tablet Take 25 mg by mouth daily with breakfast.     [provider]    Physical Exam: Vitals:   08/27/16 0521 08/27/16 0730 08/27/16 0800 08/27/16 0839  BP: (!) 153/75 (!) 146/92 (!) 161/84 (!) 160/83  Pulse: 82 78 86 87  Resp: 18   16  Temp: 98.2 F (36.8 C)     TempSrc: Oral     SpO2: 95% 93% 100% 100%    Constitutional: thin female NAD, calm, uncomfortable Eyes: PERRL, lids  and conjunctivae normal ENMT: Mucous membranes are moist. Posterior pharynx clear of any exudate or lesions.Normal dentition.  Neck: normal, supple, no masses, no thyromegaly Respiratory: clear to auscultation bilaterally, no wheezing, no crackles. Normal respiratory effort. No accessory muscle use.  Cardiovascular: Regular rate and rhythm, 2/6 systolic murmur.  No extremity edema. 2+ pedal pulses. No carotid bruits.  Abdomen:  Hypoactive BS, soft, nondistended, TTP diffusely, particularly at the suprapubic area and the epigastric area, no rebound or guarding.  Obvious hard stool palpated within the bowel loops.   Musculoskeletal: no clubbing / cyanosis. No joint deformity upper and lower extremities. Good ROM, no contractures. Normal muscle tone.  Skin: no rashes, lesions, ulcers. No induration Neurologic: CN 2-12 grossly intact. Sensation intact. Strength 5/5 in all 4.  Psychiatric:  Memory deficits.  Alert and oriented to person and place.   Labs on Admission: I have personally reviewed following labs and imaging studies  CBC:  Recent Labs Lab 08/27/16 0531  WBC 9.8  HGB 11.4*  HCT 32.1*  MCV 90.7  PLT 465*   Basic Metabolic Panel:  Recent Labs Lab 08/27/16 0531  NA 128*  K 3.8  CL 90*  CO2 27  GLUCOSE 98  BUN 15  CREATININE 0.79  CALCIUM 9.3   GFR: Estimated Creatinine Clearance: 26.2 mL/min (by C-G formula based on SCr of 0.79 mg/dL). Liver  Function Tests:  Recent Labs Lab 08/27/16 0531  AST 24  ALT 14  ALKPHOS 80  BILITOT 0.7  PROT 6.0*  ALBUMIN 3.4*    Recent Labs Lab 08/27/16 0531  LIPASE 20   No results for input(s): AMMONIA in the last 168 hours. Coagulation Profile: No results for input(s): INR, PROTIME in the last 168 hours. Cardiac Enzymes: No results for input(s): CKTOTAL, CKMB, CKMBINDEX, TROPONINI in the last 168 hours. BNP (last 3 results) No results for input(s): PROBNP in the last 8760 hours. HbA1C: No results for input(s): HGBA1C in the last 72 hours. CBG: No results for input(s): GLUCAP in the last 168 hours. Lipid Profile: No results for input(s): CHOL, HDL, LDLCALC, TRIG, CHOLHDL, LDLDIRECT in the last 72 hours. Thyroid Function Tests: No results for input(s): TSH, T4TOTAL, FREET4, T3FREE, THYROIDAB in the last 72 hours. Anemia Panel: No results for input(s): VITAMINB12, FOLATE, FERRITIN, TIBC, IRON, RETICCTPCT in the last 72 hours. Urine analysis:    Component Value Date/Time   COLORURINE YELLOW 08/27/2016 0637   APPEARANCEUR CLEAR 08/27/2016 0637   LABSPEC 1.008 08/27/2016 0637   PHURINE 6.0 08/27/2016 0637   GLUCOSEU NEGATIVE 08/27/2016 0637   HGBUR NEGATIVE 08/27/2016 0637   BILIRUBINUR NEGATIVE 08/27/2016 0637   KETONESUR 5 (A) 08/27/2016 0637   PROTEINUR NEGATIVE 08/27/2016 0637   UROBILINOGEN 0.2 01/17/2014 1826   NITRITE NEGATIVE 08/27/2016 0637   LEUKOCYTESUR NEGATIVE 08/27/2016 0637   Sepsis Labs: @LABRCNTIP (procalcitonin:4,lacticidven:4) )No results found for this or any previous visit (from the past 240 hour(s)).   Radiological Exams on Admission: Ct Renal Stone Study  Result Date: 08/27/2016 CLINICAL DATA:  Right lower quadrant pain with nausea and vomiting. Polyuria. EXAM: CT ABDOMEN AND PELVIS WITHOUT CONTRAST TECHNIQUE: Multidetector CT imaging of the abdomen and pelvis was performed following the standard protocol without IV contrast. COMPARISON:  1. MRI  thoracic lumbar spine 01/04/2014 2. CT abdomen pelvis 12/14/2013 FINDINGS: Lower chest: Atelectasis/ scarring at the right lung base. Subsegmental of lower lobe atelectasis. Small pericardial effusion. Hepatobiliary: Normal noncontrast appearance of the liver. No visible biliary dilatation. Normal gallbladder. Pancreas: Normal noncontrast appearance  of the pancreas. No peripancreatic fluid collection. Spleen: Normal. Adrenals/Urinary Tract: --Adrenal glands: Left adrenal gland is mildly enlarged but maintains its adreniform shape. --Right kidney/ureter: No hydronephrosis or perinephric stranding. No nephrolithiasis. There is a small, intermediate attenuation focus near the upper pole that is likely a hemorrhagic cyst but is technically too small to characterize accurately. --Left kidney/ureter: No hydronephrosis or perinephric stranding. No nephrolithiasis. No obstructing ureteral stones. --Urinary bladder: Unremarkable. Stomach/Bowel: No small bowel obstruction. By report, the appendix is surgically absent. There is rectosigmoid diverticulosis without acute inflammation. Vascular/Lymphatic: There is atherosclerotic calcification of the non aneurysmal abdominal aorta. No abdominal or pelvic lymphadenopathy. Reproductive: Normal uterus.  No adnexal mass. Musculoskeletal. There is extensive degenerative disease throughout the visualized spine. The patient is status post vertebral augmentation T12, L1 and L4. There is also a chronic compression fracture of T11. There are fractures of the left superior and inferior pubic rami that appear chronic. There is marked lumbar levoscoliosis. IMPRESSION: 1. No acute abdominal or pelvic abnormality. 2. Small pericardial effusion and aortic atherosclerosis. 3. Multiple chronic thoracolumbar compression fractures with prior multilevel vertebral augmentation. Electronically Signed   By: Deatra Robinson M.D.   On: 08/27/2016 06:24   Dg Hip Unilat With Pelvis Min 4 Views  Right  Result Date: 08/27/2016 CLINICAL DATA:  Pain.  Patient reports right lower quadrant pain. EXAM: DG HIP (WITH OR WITHOUT PELVIS) 4+V RIGHT COMPARISON:  CT performed concurrently. FINDINGS: No evidence of acute fracture of the pelvis or right hip. Remote fracture of the left pubic body is better characterized on concurrent CT. Enthesopathic changes adjacent to the right hip greater trochanter. Both femoral heads are well-seated in the respective acetabula. The bones are under mineralized. Scoliosis in the included lumbar spine. IMPRESSION: 1. No evidence of acute osseous abnormality of the pelvis or right hip. 2. Enthesopathy of the right greater trochanter. Electronically Signed   By: Rubye Oaks M.D.   On: 08/27/2016 06:00    Assessment/Plan Principal Problem:   Acute respiratory failure with hypoxia (HCC) Active Problems:   HTN (hypertension)   Hyponatremia   Depression   Constipation   Acute urinary retention   Acute respiratory failure with hypoxia, likely due to atelectasis and splinting from abdominal pain.  No fevers or leukocytosis or symptoms of pneumonia -  Incentive spirometry -  Control pain -  CXR:  Possible LLL pneumonia, however, I think this is due to rotation.  CT did not capture infiltrate at left base this morning  Abdominal pain, likely due to combination of stool impaction/severe constipation and acute urinary retention related to stool -  PVR and place foley if > retained urine -  Enema/disimpaction  -  Magnesium citrate -  miralax BID -  Tylenol scheduled -  Continue lyrica -  Tramadol prn -  Toradol prn  Generalized weakness, inability to ambulate -  PT/OT  -  CM and SW consultation  Anxiety and depression -  Continue valium and wellbutrin  Dementia  -  Continue aricept  Essential hypertension, blood pressures elevated, likely due to pain and stress -  Continue ARB  Hyponatremia, likely and likely due to tea and toast diet and urinary  retention -  IVF and repeat in AM  Anemia of chronic disease -  hgb at baseline 11.4 g/dl  DVT prophylaxis: lovenox  Code Status: DNR Family Communication: patient and her son who is HPOA.  Son-in-law is RN who works on Programmer, applications at [employer].   Disposition Plan:  home possibly later today  Consults called: none  Admission status: observation, med-surg due to acute hypoxia.  Will disimpact and perform enema which may help with her acute urinary retention, pain, and hypoxia.  If she feels better afterwards, plan to discharge home later today.     Renae Fickle MD [facility] Pager [phone]  If 7PM-7AM, please contact night-coverage [email or web address] Password [email or web address]  08/27/2016, 9:20 AM

## 2016-08-27 NOTE — ED Notes (Signed)
Bed: [id number] Expected date:  Expected time:  Means of arrival:  Comments: 83yo F abd pain

## 2016-08-27 NOTE — Progress Notes (Signed)
PHARMACIST - PHYSICIAN ORDER COMMUNICATION  CONCERNING: P&T Medication Policy on Herbal Medications  DESCRIPTION:  This patient's order for:  melatonin  has been noted.  This product(s) is classified as an "herbal" or natural product. Due to a lack of definitive safety studies or FDA approval, nonstandard manufacturing practices, plus the potential risk of unknown drug-drug interactions while on inpatient medications, the Pharmacy and Therapeutics Committee does not permit the use of "herbal" or natural products of this type within [facility].   ACTION TAKEN: The pharmacy department is unable to verify this order at this time and the order has been discontinued. Please reevaluate patient's clinical condition at discharge and address if the herbal or natural product(s) should be resumed at that time.  Adalberto ColeNikola Areanna Gengler, PharmD, BCPS Pager [phone] 08/27/2016 10:48 AM

## 2016-08-28 ENCOUNTER — Observation Stay (HOSPITAL_COMMUNITY): Payer: PPO

## 2016-08-28 ENCOUNTER — Encounter (HOSPITAL_COMMUNITY): Payer: Self-pay | Admitting: Radiology

## 2016-08-28 DIAGNOSIS — K5903 Drug induced constipation: Secondary | ICD-10-CM | POA: Diagnosis not present

## 2016-08-28 DIAGNOSIS — H3530 Unspecified macular degeneration: Secondary | ICD-10-CM | POA: Diagnosis not present

## 2016-08-28 DIAGNOSIS — E785 Hyperlipidemia, unspecified: Secondary | ICD-10-CM | POA: Diagnosis not present

## 2016-08-28 DIAGNOSIS — S22019A Unspecified fracture of first thoracic vertebra, initial encounter for closed fracture: Secondary | ICD-10-CM | POA: Diagnosis not present

## 2016-08-28 DIAGNOSIS — S22060D Wedge compression fracture of T7-T8 vertebra, subsequent encounter for fracture with routine healing: Secondary | ICD-10-CM | POA: Diagnosis not present

## 2016-08-28 DIAGNOSIS — M4854XD Collapsed vertebra, not elsewhere classified, thoracic region, subsequent encounter for fracture with routine healing: Secondary | ICD-10-CM | POA: Diagnosis not present

## 2016-08-28 DIAGNOSIS — E876 Hypokalemia: Secondary | ICD-10-CM | POA: Diagnosis not present

## 2016-08-28 DIAGNOSIS — R54 Age-related physical debility: Secondary | ICD-10-CM | POA: Diagnosis not present

## 2016-08-28 DIAGNOSIS — S22069A Unspecified fracture of T7-T8 vertebra, initial encounter for closed fracture: Secondary | ICD-10-CM | POA: Diagnosis not present

## 2016-08-28 DIAGNOSIS — Z66 Do not resuscitate: Secondary | ICD-10-CM | POA: Diagnosis not present

## 2016-08-28 DIAGNOSIS — E43 Unspecified severe protein-calorie malnutrition: Secondary | ICD-10-CM | POA: Diagnosis not present

## 2016-08-28 DIAGNOSIS — R0789 Other chest pain: Secondary | ICD-10-CM | POA: Diagnosis not present

## 2016-08-28 DIAGNOSIS — F329 Major depressive disorder, single episode, unspecified: Secondary | ICD-10-CM | POA: Diagnosis not present

## 2016-08-28 DIAGNOSIS — D638 Anemia in other chronic diseases classified elsewhere: Secondary | ICD-10-CM | POA: Diagnosis not present

## 2016-08-28 DIAGNOSIS — R1084 Generalized abdominal pain: Secondary | ICD-10-CM | POA: Diagnosis not present

## 2016-08-28 DIAGNOSIS — R41841 Cognitive communication deficit: Secondary | ICD-10-CM | POA: Diagnosis not present

## 2016-08-28 DIAGNOSIS — M4854XA Collapsed vertebra, not elsewhere classified, thoracic region, initial encounter for fracture: Secondary | ICD-10-CM | POA: Diagnosis not present

## 2016-08-28 DIAGNOSIS — Z79899 Other long term (current) drug therapy: Secondary | ICD-10-CM | POA: Diagnosis not present

## 2016-08-28 DIAGNOSIS — E440 Moderate protein-calorie malnutrition: Secondary | ICD-10-CM | POA: Insufficient documentation

## 2016-08-28 DIAGNOSIS — R339 Retention of urine, unspecified: Secondary | ICD-10-CM | POA: Diagnosis not present

## 2016-08-28 DIAGNOSIS — R112 Nausea with vomiting, unspecified: Secondary | ICD-10-CM | POA: Diagnosis not present

## 2016-08-28 DIAGNOSIS — M6281 Muscle weakness (generalized): Secondary | ICD-10-CM | POA: Diagnosis not present

## 2016-08-28 DIAGNOSIS — J9811 Atelectasis: Secondary | ICD-10-CM | POA: Diagnosis not present

## 2016-08-28 DIAGNOSIS — D649 Anemia, unspecified: Secondary | ICD-10-CM | POA: Diagnosis not present

## 2016-08-28 DIAGNOSIS — N390 Urinary tract infection, site not specified: Secondary | ICD-10-CM | POA: Diagnosis not present

## 2016-08-28 DIAGNOSIS — F0390 Unspecified dementia without behavioral disturbance: Secondary | ICD-10-CM | POA: Diagnosis not present

## 2016-08-28 DIAGNOSIS — R531 Weakness: Secondary | ICD-10-CM | POA: Diagnosis not present

## 2016-08-28 DIAGNOSIS — R0902 Hypoxemia: Secondary | ICD-10-CM | POA: Diagnosis not present

## 2016-08-28 DIAGNOSIS — F419 Anxiety disorder, unspecified: Secondary | ICD-10-CM | POA: Diagnosis not present

## 2016-08-28 DIAGNOSIS — R293 Abnormal posture: Secondary | ICD-10-CM | POA: Diagnosis not present

## 2016-08-28 DIAGNOSIS — I10 Essential (primary) hypertension: Secondary | ICD-10-CM | POA: Diagnosis not present

## 2016-08-28 DIAGNOSIS — E871 Hypo-osmolality and hyponatremia: Secondary | ICD-10-CM | POA: Diagnosis not present

## 2016-08-28 DIAGNOSIS — G8929 Other chronic pain: Secondary | ICD-10-CM | POA: Diagnosis not present

## 2016-08-28 DIAGNOSIS — R2681 Unsteadiness on feet: Secondary | ICD-10-CM | POA: Diagnosis not present

## 2016-08-28 DIAGNOSIS — S22080D Wedge compression fracture of T11-T12 vertebra, subsequent encounter for fracture with routine healing: Secondary | ICD-10-CM | POA: Diagnosis not present

## 2016-08-28 DIAGNOSIS — R338 Other retention of urine: Secondary | ICD-10-CM | POA: Diagnosis not present

## 2016-08-28 DIAGNOSIS — W19XXXA Unspecified fall, initial encounter: Secondary | ICD-10-CM | POA: Diagnosis not present

## 2016-08-28 DIAGNOSIS — K5901 Slow transit constipation: Secondary | ICD-10-CM | POA: Diagnosis not present

## 2016-08-28 DIAGNOSIS — M810 Age-related osteoporosis without current pathological fracture: Secondary | ICD-10-CM | POA: Diagnosis not present

## 2016-08-28 DIAGNOSIS — Z9181 History of falling: Secondary | ICD-10-CM | POA: Diagnosis not present

## 2016-08-28 DIAGNOSIS — J9601 Acute respiratory failure with hypoxia: Secondary | ICD-10-CM | POA: Diagnosis not present

## 2016-08-28 LAB — CBC
HEMATOCRIT: 34.2 % — AB (ref 36.0–46.0)
Hemoglobin: 11.5 g/dL — ABNORMAL LOW (ref 12.0–15.0)
MCH: 31.2 pg (ref 26.0–34.0)
MCHC: 33.6 g/dL (ref 30.0–36.0)
MCV: 92.7 fL (ref 78.0–100.0)
Platelets: 468 10*3/uL — ABNORMAL HIGH (ref 150–400)
RBC: 3.69 MIL/uL — ABNORMAL LOW (ref 3.87–5.11)
RDW: 13.4 % (ref 11.5–15.5)
WBC: 8.6 10*3/uL (ref 4.0–10.5)

## 2016-08-28 LAB — BASIC METABOLIC PANEL
Anion gap: 11 (ref 5–15)
Anion gap: 9 (ref 5–15)
BUN: 13 mg/dL (ref 6–20)
BUN: 11 mg/dL (ref 6–20)
CHLORIDE: 95 mmol/L — AB (ref 101–111)
CHLORIDE: 96 mmol/L — AB (ref 101–111)
CO2: 27 mmol/L (ref 22–32)
CO2: 28 mmol/L (ref 22–32)
CREATININE: 0.66 mg/dL (ref 0.44–1.00)
CREATININE: 0.61 mg/dL (ref 0.44–1.00)
Calcium: 8.3 mg/dL — ABNORMAL LOW (ref 8.9–10.3)
Calcium: 7.9 mg/dL — ABNORMAL LOW (ref 8.9–10.3)
GFR calc Af Amer: >60 mL/min (ref >60)
GFR calc Af Amer: >60 mL/min (ref >60)
GFR calc non Af Amer: >60 mL/min (ref >60)
Glucose, Bld: 114 mg/dL — ABNORMAL HIGH (ref 65–99)
Glucose, Bld: 114 mg/dL — ABNORMAL HIGH (ref 65–99)
Potassium: 2.9 mmol/L — ABNORMAL LOW (ref 3.5–5.1)
Potassium: 3.5 mmol/L (ref 3.5–5.1)
SODIUM: 133 mmol/L — AB (ref 135–145)
Sodium: 133 mmol/L — ABNORMAL LOW (ref 135–145)

## 2016-08-28 LAB — MAGNESIUM: Magnesium: 2.5 mg/dL — ABNORMAL HIGH (ref 1.7–2.4)

## 2016-08-28 MED ORDER — IOPAMIDOL (ISOVUE-300) INJECTION 61%
INTRAVENOUS | Status: AC
  Filled 2016-08-28: qty 75

## 2016-08-28 MED ORDER — LORAZEPAM 2 MG/ML IJ SOLN
0.5000 mg | Freq: Four times a day (QID) | INTRAMUSCULAR | Status: DC | PRN
  Administered 2016-08-28: 0.5 mg via INTRAVENOUS
  Administered 2016-08-30: 1 mg via INTRAVENOUS
  Filled 2016-08-28 (×2): qty 1

## 2016-08-28 MED ORDER — ONDANSETRON HCL 4 MG/2ML IJ SOLN
4.0000 mg | Freq: Four times a day (QID) | INTRAMUSCULAR | Status: DC | PRN
Start: 2016-08-28 — End: 2016-08-31
  Administered 2016-08-28 – 2016-08-30 (×3): 4 mg via INTRAVENOUS
  Filled 2016-08-28 (×3): qty 2

## 2016-08-28 MED ORDER — FAMOTIDINE 20 MG PO TABS: 20.0000 mg | ORAL_TABLET | Freq: Two times a day (BID) | ORAL | Status: DC

## 2016-08-28 MED ORDER — GADOBENATE DIMEGLUMINE 529 MG/ML IV SOLN
10.0000 mL | Freq: Once | INTRAVENOUS | Status: AC | PRN
  Administered 2016-08-28: 8 mL via INTRAVENOUS

## 2016-08-28 MED ORDER — POTASSIUM CHLORIDE CRYS ER 20 MEQ PO TBCR
40.0000 meq | EXTENDED_RELEASE_TABLET | ORAL | Status: AC
  Administered 2016-08-28 (×2): 40 meq via ORAL
  Filled 2016-08-28 (×2): qty 2

## 2016-08-28 MED ORDER — IOPAMIDOL (ISOVUE-300) INJECTION 61%
75.0000 mL | Freq: Once | INTRAVENOUS | Status: AC | PRN
  Administered 2016-08-28: 60 mL via INTRAVENOUS

## 2016-08-28 MED ORDER — DICLOFENAC SODIUM 1 % TD GEL
2.0000 g | Freq: Four times a day (QID) | TRANSDERMAL | Status: DC
  Administered 2016-08-28 – 2016-08-31 (×13): 2 g via TOPICAL
  Filled 2016-08-28: qty 100

## 2016-08-28 MED ORDER — FAMOTIDINE 20 MG PO TABS
20.0000 mg | ORAL_TABLET | Freq: Every day | ORAL | Status: DC
  Administered 2016-08-28 – 2016-08-31 (×4): 20 mg via ORAL
  Filled 2016-08-28 (×4): qty 1

## 2016-08-28 MED ORDER — POLYETHYLENE GLYCOL 3350 17 G PO PACK
17.0000 g | PACK | Freq: Every day | ORAL | Status: DC | PRN
  Filled 2016-08-28: qty 1

## 2016-08-28 MED ORDER — ENOXAPARIN SODIUM 30 MG/0.3ML ~~LOC~~ SOLN
20.0000 mg | SUBCUTANEOUS | Status: DC
  Administered 2016-08-28 – 2016-08-30 (×3): 20 mg via SUBCUTANEOUS
  Filled 2016-08-28 (×3): qty 0.3

## 2016-08-28 NOTE — Care Management Note (Signed)
Case Management Note  Patient Details  Name: Nancy JewelBarbara P Meyer MRN: [id number] Date of Birth: 01/22/1921  Subjective/Objective:                  83 y.o. female with history of dementia, hypertension, chronic pain, anxiety and depression, osteoporosis and multiple compression fractures who presents with worsening abdominal pain.  Patient previously was able to ambulate with a rolling walker short distanced, but over the last couple of days has been in too much pain to get out of bed.  She had a fall two weeks ago which may have resulted in some new thoracic compression fractures.  She has been taking lyrica and tramadol for pain.  They gave her an enema a 1-2 weeks ago with good results, but since then she may not have been passing BMs.  Over the last two days, she has passed a few hard pellets.  One episode of emesis earlier this week, but none since although she has complained of nausea.  She has been drinking well according to son.  She has been complaining of right hip pains and back pains which are chronic but for the last two days, she has had intermittent severe upper abdominal pains which prevent her from getting to bedside commode or even sitting on a bedpain.  Worse with palpation and movement.  PCP felt pains were possibly due to NSAID gastritis and recommended stopping her naprosyn.  Her pain worsened. Denies fevers, always has chills, no sinus congestion, sore throat.  Having difficulty voiding today but usually voids frequently and do not feel that they are dehydrated.    Action/Plan: Date:  August 28, 2016  Chart reviewed for concurrent status and case management needs.  Will continue to follow patient progress.  Discharge Planning: following for needs  Expected discharge date: [id number]  Marcelle SmilingRhonda Wyndi Northrup, BSN, [place] LakeRN3, [place]CCM   [phone]   Expected Discharge Date:   (unknown)               Expected Discharge Plan:  Home/Self Care  In-House Referral:     Discharge planning Services  CM  Consult  Post Acute Care Choice:    Choice offered to:     DME Arranged:    DME Agency:     HH Arranged:    HH Agency:     Status of Service:  In process, will continue to follow  If discussed at Long Length of Stay Meetings, dates discussed:    Additional Comments:  Golda AcreDavis, Infant Doane Lynn, RN 08/28/2016, 9:26 AM

## 2016-08-28 NOTE — Progress Notes (Signed)
Initial Nutrition Assessment  DOCUMENTATION CODES:   Non-severe (moderate) malnutrition in context of chronic illness  INTERVENTION:   RD will order Magic cup TID with meals when diet advanced, each supplement provides 290 kcal and 9 grams of protein  Pt declines any supplements- pt already ordered for Boost Breeze  NUTRITION DIAGNOSIS:   Malnutrition (moderate) related to poor appetite (advanced age) as evidenced by moderate depletion of body fat, moderate depletions of muscle mass.  GOAL:   Patient will meet greater than or equal to 90% of their needs  MONITOR:   PO intake, Labs, Weight trends  REASON FOR ASSESSMENT:   Malnutrition Screening Tool    ASSESSMENT:   83 y.o. female with history of dementia, hypertension, chronic pain, anxiety and depression, osteoporosis and multiple compression fractures who presents with worsening abdominal pain. and constipation   Met with pt in room today. Pt is a poor historian but reports that her appetite has been declining over the past two years. Pt reports that she does eat small portions of food but that she doesn't eat often. Pt reports stable weight although she tells me she weighs 92lbs; pt documented to weigh 85lbs. RD is unsure if true weight loss; no recent weight history for this pt. Pt currently eating 70% of her clear liquid diet. Pt refuses supplements but reports that she will eat ice cream; RD will order Magic Cups when diet advanced. Pt with low sodium and potassium; monitor and supplement as needed per MD discretion.    Medications reviewed and include: colace, lovenox, tramadol  Labs reviewed: Na 133(L), K 2.9(L), Cl 95(L), Ca 8.3(L), Mg 2.5(H)  Nutrition-Focused physical exam completed. Findings are moderate depletions of fat and muscle over entire body, and no edema  Diet Order:  Diet clear liquid Room service appropriate? Yes; Fluid consistency: Thin  Skin:  Reviewed, no issues  Last BM:  6/4  Height:   Ht  Readings from Last 1 Encounters:  08/23/16 _0  (1.473 m)    Weight:   Wt Readings from Last 1 Encounters:  08/23/16 85 lb (38.6 kg)    Ideal Body Weight:  44 kg  BMI:  There is no height or weight on file to calculate BMI.  Estimated Nutritional Needs:   Kcal:  1300-1500kcal/day   Protein:  58-66g/day   Fluid:  >1.3L/day   EDUCATION NEEDS:   No education needs identified at this time  Koleen Distance MS, RD, LDN Pager #- [phone]

## 2016-08-28 NOTE — Progress Notes (Signed)
PROGRESS NOTE    Nancy Meyer   ZOX:[id number]  DOB: 1920/07/13  DOA: 08/27/2016 PCP: Kaleen Mask, MD   Brief Narrative:  Nancy Meyer is a 83 y.o. female with history of dementia, hypertension, chronic pain, anxiety and depression, osteoporosis and multiple compression fractures who presents with worsening abdominal pain.  Patient previously was able to ambulate with a rolling walker short distanced, but over the last couple of days has been in too much pain to get out of bed.  She had a fall two weeks ago which may have resulted in some new thoracic compression fractures.  She has been taking lyrica and tramadol for pain.  They gave her an enema a 1-2 weeks ago with good results, but since then she may not have been passing BMs.  Over the last two days, she has passed a few hard pellets.  One episode of emesis earlier this week, but none since although she has complained of nausea.  She has been drinking well according to son.  She has been complaining of right hip pains and back pains which are chronic but for the last two days, she has had intermittent severe upper abdominal pains which prevent her from getting to bedside commode or even sitting on a bedpain.  Worse with palpation and movement.  PCP felt pains were possibly due to NSAID gastritis and recommended stopping her naprosyn.  Her pain worsened. Denies fevers, always has chills, no sinus congestion, sore throat.  Having difficulty voiding today but usually voids frequently and do not feel that they are dehydrated.    Subjective: Pain in chest wall and upper abdomen. No other complaints. Confused.   Assessment & Plan:   Principal Problem:   Acute respiratory failure with hypoxia - due to atelectasis and pain with deep breaths- pain control- currently not hypoxic  Active Problems: Severe constipation - resolved with laxatives- will need to be on Miralax to prevent further constipation while she is taking  Tramadol frequently-     Acute urinary retention - resolved with resolution of constipation  Acute chest wall pain - tender in right lower rib cage and all across the upper abdomen - imaging is unrevealing- she is clearly tender to palpation and I do not feel there is any other reason for pain other than muscle/ rib bruising which may have occurred during a recent unwitnessed fall - she has been taking Tramadol at home for this pain - she was taking Naprosyn as well but when GI issues occurred, the family was hesitant to give her more NSAIDS due to suspicion for peptic ulcers - cont Tramadol and add Voltaren gel  Nausea  add Pepcid as she is still nauseated today and may in fact have GI upset from Naprosyn  T11 compression fracture - obtain MRI - she has had kyphoplastiy in the past of T12, L1 and L 4    HTN (hypertension) - cont ARB    Hyponatremia - resolving with IV hydration  Hypokalemia - due to laxatives/diarrhea - replace   DVT prophylaxis: Lovenox Code Status: DNR Family Communication: son, Jessenia Filippone Disposition Plan: SNF vs home with [facility] when stable Consultants:    Procedures:    Antimicrobials:  Anti-infectives    None       Objective: Vitals:   08/27/16 1045 08/27/16 2223 08/28/16 0546 08/28/16 1305  BP: (!) 186/92 (!) 172/91 (!) 153/80 (!) 151/76  Pulse: 95 (!) 102 86 87  Resp: 18 16 18  18  Temp: 97.5 F (36.4 C) 97.6 F (36.4 C) 97.5 F (36.4 C) 97.8 F (36.6 C)  TempSrc: Oral Oral Oral Oral  SpO2: 100% 99% 100% 100%    Intake/Output Summary (Last 24 hours) at 08/28/16 1526 Last data filed at 08/28/16 1500  Gross per 24 hour  Intake           2262.5 ml  Output                0 ml  Net           2262.5 ml   There were no vitals filed for this visit.  Examination: General exam: Appears comfortable  HEENT: PERRLA, oral mucosa moist, no sclera icterus or thrush Respiratory system: Clear to auscultation. Respiratory effort  normal. Chest wall: tender in right lower chest wall Cardiovascular system: S1 & S2 heard, RRR.  No murmurs  Gastrointestinal system: Abdomen soft, -tender across upper abdomen, nondistended. Normal bowel sound. No organomegaly Central nervous system: Alert and oriented. No focal neurological deficits. Extremities: No cyanosis, clubbing or edema Skin: No rashes or ulcers Psychiatry:  Mood & affect appropriate.     Data Reviewed: I have personally reviewed following labs and imaging studies  CBC:  Recent Labs Lab 08/27/16 0531 08/28/16 0718  WBC 9.8 8.6  HGB 11.4* 11.5*  HCT 32.1* 34.2*  MCV 90.7 92.7  PLT 465* 468*   Basic Metabolic Panel:  Recent Labs Lab 08/27/16 0531 08/28/16 0718 08/28/16 1332  NA 128* 133* 133*  K 3.8 2.9* 3.5  CL 90* 95* 96*  CO2 27 27 28   GLUCOSE 98 114* 114*  BUN 15 13 11   CREATININE 0.79 0.66 0.61  CALCIUM 9.3 8.3* 7.9*  MG  --  2.5*  --    GFR: Estimated Creatinine Clearance: 26.2 mL/min (by C-G formula based on SCr of 0.61 mg/dL). Liver Function Tests:  Recent Labs Lab 08/27/16 0531  AST 24  ALT 14  ALKPHOS 80  BILITOT 0.7  PROT 6.0*  ALBUMIN 3.4*    Recent Labs Lab 08/27/16 0531  LIPASE 20   No results for input(s): AMMONIA in the last 168 hours. Coagulation Profile: No results for input(s): INR, PROTIME in the last 168 hours. Cardiac Enzymes: No results for input(s): CKTOTAL, CKMB, CKMBINDEX, TROPONINI in the last 168 hours. BNP (last 3 results) No results for input(s): PROBNP in the last 8760 hours. HbA1C: No results for input(s): HGBA1C in the last 72 hours. CBG: No results for input(s): GLUCAP in the last 168 hours. Lipid Profile: No results for input(s): CHOL, HDL, LDLCALC, TRIG, CHOLHDL, LDLDIRECT in the last 72 hours. Thyroid Function Tests: No results for input(s): TSH, T4TOTAL, FREET4, T3FREE, THYROIDAB in the last 72 hours. Anemia Panel: No results for input(s): VITAMINB12, FOLATE, FERRITIN, TIBC,  IRON, RETICCTPCT in the last 72 hours. Urine analysis:    Component Value Date/Time   COLORURINE YELLOW 08/27/2016 0637   APPEARANCEUR CLEAR 08/27/2016 0637   LABSPEC 1.008 08/27/2016 0637   PHURINE 6.0 08/27/2016 0637   GLUCOSEU NEGATIVE 08/27/2016 0637   HGBUR NEGATIVE 08/27/2016 0637   BILIRUBINUR NEGATIVE 08/27/2016 0637   KETONESUR 5 (A) 08/27/2016 0637   PROTEINUR NEGATIVE 08/27/2016 0637   UROBILINOGEN 0.2 01/17/2014 1826   NITRITE NEGATIVE 08/27/2016 0637   LEUKOCYTESUR NEGATIVE 08/27/2016 0637   Sepsis Labs: @LABRCNTIP (procalcitonin:4,lacticidven:4) )No results found for this or any previous visit (from the past 240 hour(s)).       Radiology Studies: Ct Chest W Contrast  Result  Date: 08/28/2016 CLINICAL DATA:  83 year old hypertensive female with generalized chest pain today. Subsequent encounter. EXAM: CT CHEST WITH CONTRAST TECHNIQUE: Multidetector CT imaging of the chest was performed during intravenous contrast administration. CONTRAST:  60mL ISOVUE-300 IOPAMIDOL (ISOVUE-300) INJECTION 61% COMPARISON:  08/27/2016 chest x-ray. FINDINGS: Cardiovascular: No pulmonary embolus or aortic dissection. Atherosclerotic changes aorta. Cardiomegaly. Trace pericardial fluid. Mediastinum/Nodes: 1.9 cm right lobe with thyroid lesion of indeterminate etiology. Small hiatal hernia. No mediastinal or hilar adenopathy. Lungs/Pleura: Bibasilar atelectasis without central obstructing process. Anterior inferior right upper lobe atelectasis without central obstructing mass. Scarring apices greater on the right. Minimal pleural thickening. Upper Abdomen: Adrenal gland hyperplasia. Elevated right hemidiaphragm. Musculoskeletal: T7 compression fracture with 75% loss height with slight retropulsion posterior inferior aspect. Age of this fracture is indeterminate. T11 compression fracture predominantly involving superior endplate with fragmentation. 90% loss of height centrally. 60% loss of height  anteriorly. 20% loss of height posteriorly. Remote T12 and L1 compression fracture treated with cement augmentation. Dense breast parenchyma bilaterally.  No axillary adenopathy. IMPRESSION: No pulmonary embolus or aortic dissection. Cardiomegaly. Bibasilar and anterior inferior right upper lobe atelectasis without central obstructing mass. Scarring apices greater on the right. T7 compression fracture with 75% loss height with slight retropulsion posterior inferior aspect. Age of this fracture is indeterminate. T11 compression fracture predominantly involving superior endplate with fragmentation. 90% loss of height centrally. 60% loss of height anteriorly. 20% loss of height posteriorly. Remote T12 and L1 compression fracture treated with cement augmentation. 1.9 cm right lobe with thyroid lesion of indeterminate etiology. Small hiatal hernia. Electronically Signed   By: Lacy Duverney M.D.   On: 08/28/2016 11:24   Dg Chest Port 1 View  Result Date: 08/27/2016 CLINICAL DATA:  Hypoxia. EXAM: PORTABLE CHEST 1 VIEW COMPARISON:  12-07-2016 . FINDINGS: Mediastinum is normal. Left lower lobe atelectasis/ infiltrate noted. Mild right perihilar infiltrate cannot be excluded. Small left pleural effusion cannot be excluded. No pneumothorax. Heart size stable. IMPRESSION: 1. Left lower lobe atelectasis and infiltrate. Small left pleural effusion. 2.  Mild right perihilar infiltrate cannot be excluded. Electronically Signed   By: Maisie Fus  Register   On: 08/27/2016 09:18   Ct Renal Stone Study  Result Date: 08/27/2016 CLINICAL DATA:  Right lower quadrant pain with nausea and vomiting. Polyuria. EXAM: CT ABDOMEN AND PELVIS WITHOUT CONTRAST TECHNIQUE: Multidetector CT imaging of the abdomen and pelvis was performed following the standard protocol without IV contrast. COMPARISON:  1. MRI thoracic lumbar spine 01/04/2014 2. CT abdomen pelvis 12/14/2013 FINDINGS: Lower chest: Atelectasis/ scarring at the right lung base.  Subsegmental of lower lobe atelectasis. Small pericardial effusion. Hepatobiliary: Normal noncontrast appearance of the liver. No visible biliary dilatation. Normal gallbladder. Pancreas: Normal noncontrast appearance of the pancreas. No peripancreatic fluid collection. Spleen: Normal. Adrenals/Urinary Tract: --Adrenal glands: Left adrenal gland is mildly enlarged but maintains its adreniform shape. --Right kidney/ureter: No hydronephrosis or perinephric stranding. No nephrolithiasis. There is a small, intermediate attenuation focus near the upper pole that is likely a hemorrhagic cyst but is technically too small to characterize accurately. --Left kidney/ureter: No hydronephrosis or perinephric stranding. No nephrolithiasis. No obstructing ureteral stones. --Urinary bladder: Unremarkable. Stomach/Bowel: No small bowel obstruction. By report, the appendix is surgically absent. There is rectosigmoid diverticulosis without acute inflammation. Vascular/Lymphatic: There is atherosclerotic calcification of the non aneurysmal abdominal aorta. No abdominal or pelvic lymphadenopathy. Reproductive: Normal uterus.  No adnexal mass. Musculoskeletal. There is extensive degenerative disease throughout the visualized spine. The patient is status post vertebral augmentation T12,  L1 and L4. There is also a chronic compression fracture of T11. There are fractures of the left superior and inferior pubic rami that appear chronic. There is marked lumbar levoscoliosis. IMPRESSION: 1. No acute abdominal or pelvic abnormality. 2. Small pericardial effusion and aortic atherosclerosis. 3. Multiple chronic thoracolumbar compression fractures with prior multilevel vertebral augmentation. Electronically Signed   By: Deatra RobinsonKevin  Herman M.D.   On: 08/27/2016 06:24   Dg Hip Unilat With Pelvis Min 4 Views Right  Result Date: 08/27/2016 CLINICAL DATA:  Pain.  Patient reports right lower quadrant pain. EXAM: DG HIP (WITH OR WITHOUT PELVIS) 4+V RIGHT  COMPARISON:  CT performed concurrently. FINDINGS: No evidence of acute fracture of the pelvis or right hip. Remote fracture of the left pubic body is better characterized on concurrent CT. Enthesopathic changes adjacent to the right hip greater trochanter. Both femoral heads are well-seated in the respective acetabula. The bones are under mineralized. Scoliosis in the included lumbar spine. IMPRESSION: 1. No evidence of acute osseous abnormality of the pelvis or right hip. 2. Enthesopathy of the right greater trochanter. Electronically Signed   By: Rubye OaksMelanie  Ehinger M.D.   On: 08/27/2016 06:00      Scheduled Meds: . acetaminophen  1,000 mg Oral TID  . buPROPion  150 mg Oral BID  . diazepam  1 mg Oral QHS  . diclofenac sodium  2 g Topical QID  . docusate sodium  200 mg Oral BID  . donepezil  10 mg Oral QHS  . enoxaparin (LOVENOX) injection  20 mg Subcutaneous Q24H  . feeding supplement  1 Container Oral TID BM  . iopamidol      . losartan  100 mg Oral Q breakfast  . pregabalin  25 mg Oral BID WC  . pregabalin  50 mg Oral QHS   Continuous Infusions: . sodium chloride 75 mL/hr at 08/28/16 0503     LOS: 0 days    Time spent in minutes: 35    Calvert CantorSaima Anisia Leija, MD [facility] Pager: [email or web address] Password [email or web address] 08/28/2016, 3:26 PM

## 2016-08-28 NOTE — Evaluation (Signed)
Occupational Therapy Evaluation Patient Details Name: Nancy Meyer Mitcham MRN: [id number] DOB: [birth year] Today's Date: 08/28/2016    History of Present Illness Nancy Meyer Yasuda is a 83 y.o. female with history of dementia, hypertension, chronic pain, anxiety and depression, osteoporosis and multiple compression fractures who presents with worsening abdominal pain.  Patient previously was able to ambulate with a rolling walker short distanced, but over the last couple of days has been in too much pain to get out of bed.   Clinical Impression   Pt was admitted for the above.  She was managing at home prior to admission with 20 hours of assistance. She will need 24/7 or SNF prior to admission. Goals are for min guard to supervision for toileting. Pt has assistance for adls at baseline    Follow Up Recommendations  SNF;Supervision/Assistance - 24 hour (HHOT if home)    Equipment Recommendations  3 in 1 bedside commode    Recommendations for Other Services       Precautions / Restrictions Precautions Precautions: Fall Restrictions Weight Bearing Restrictions: No      Mobility Bed Mobility Overal bed mobility: Needs Assistance         Sit to supine: Min guard   General bed mobility comments: extra time  Transfers Overall transfer level: Needs assistance Equipment used:  (used bedrail and arm of commode)   Sit to Stand: Min guard Stand pivot transfers: Min guard       General transfer comment: for safety    Balance                                           ADL either performed or assessed with clinical judgement   ADL Overall ADL's : Needs assistance/impaired     Grooming: Wash/dry face;Brushing hair;Sitting;Set up   Upper Body Bathing: Minimal assistance;Sitting   Lower Body Bathing: Maximal assistance;Sit to/from stand   Upper Body Dressing : Moderate assistance;Sitting   Lower Body Dressing: Maximal assistance;Sit to/from stand    Toilet Transfer: Min guard;Stand-pivot;BSC   Toileting- ArchitectClothing Manipulation and Hygiene: Maximal assistance;Sit to/from stand         General ADL Comments: fatiqued after using commode.  Pt has assistance at baseline     Vision         Perception     Praxis      Pertinent Vitals/Pain Pain Assessment: Faces Faces Pain Scale: Hurts even more Pain Location: ribs Pain Descriptors / Indicators: Aching Pain Intervention(s): Limited activity within patient's tolerance;Monitored during session;Premedicated before session;Repositioned;Ice applied     Hand Dominance     Extremity/Trunk Assessment Upper Extremity Assessment Upper Extremity Assessment: Generalized weakness (ROM WFLs)       Cervical / Trunk Assessment Cervical / Trunk Assessment: Kyphotic;Other exceptions Cervical / Trunk Exceptions: unable to extend trunk in standing, remains flexed at hips near 90 degrees   Communication Communication Communication: No difficulties   Cognition Arousal/Alertness: Awake/alert Behavior During Therapy: WFL for tasks assessed/performed Overall Cognitive Status: No family/caregiver present to determine baseline cognitive functioning                                 General Comments: decreased memory; unsure of baseline   General Comments       Exercises     Shoulder Instructions  Home Living Family/patient expects to be discharged to:: Private residence Living Arrangements: Alone Available Help at Discharge: Personal care attendant;Family                                    Prior Functioning/Environment Level of Independence: Needs assistance        Comments: has PCA 20 hours a week;helps with ADLs        OT Problem List: Decreased strength;Decreased activity tolerance;Pain;Decreased knowledge of use of DME or AE;Impaired balance (sitting and/or standing);Decreased cognition      OT Treatment/Interventions: Self-care/ADL  training;Energy conservation;DME and/or AE instruction;Balance training;Patient/family education;Therapeutic activities    OT Goals(Current goals can be found in the care plan section) Acute Rehab OT Goals Patient Stated Goal: none stated OT Goal Formulation: Patient unable to participate in goal setting Time For Goal Achievement: 09/04/16 Potential to Achieve Goals: Fair ADL Goals Pt Will Perform Grooming: with min guard assist;standing Pt Will Transfer to Toilet: bedside commode;stand pivot transfer;with supervision Pt Will Perform Toileting - Clothing Manipulation and hygiene: with supervision;sit to/from stand  OT Frequency: Min 2X/week   Barriers to D/C:            Co-evaluation              AM-PAC PT "6 Clicks" Daily Activity     Outcome Measure Help from another person eating meals?: A Little Help from another person taking care of personal grooming?: A Little Help from another person toileting, which includes using toliet, bedpan, or urinal?: A Lot Help from another person bathing (including washing, rinsing, drying)?: A Lot Help from another person to put on and taking off regular upper body clothing?: A Lot Help from another person to put on and taking off regular lower body clothing?: A Lot 6 Click Score: 14   End of Session    Activity Tolerance: Patient tolerated treatment well Patient left: in bed;with call bell/phone within reach;with bed alarm set  OT Visit Diagnosis: Unsteadiness on feet (R26.81)                Time: 0812-0829 OT Time Calculation (min): 17 min Charges:  OT General Charges $OT Visit: 1 Procedure OT Evaluation $OT Eval Low Complexity: 1 Procedure G-Codes: OT G-codes **NOT FOR INPATIENT CLASS** Functional Assessment Tool Used: Clinical judgement Functional Limitation: Self care Self Care Current Status (U9811): At least 60 percent but less than 80 percent impaired, limited or restricted Self Care Goal Status (B1478): At least 1  percent but less than 20 percent impaired, limited or restricted   Marica Otter, OTR/L [phone] 08/28/2016  Nayla Dias 08/28/2016, 8:43 AM

## 2016-08-28 NOTE — Progress Notes (Addendum)
CSW met with patient and family at bedside to discuss discharge plan. Patient family reports, the patient son, also HCPOA they have not been able to get in contact to discuss discharge plan. They are unsure if patient will D/C home with or to SNF. PT recommends HomeHealth.  CSW informed family of possible d/c for patient  today, per physician.  CSW provided family with contact number to call when ready to discuss.   Patient accepted to SNF-[facility]. Authorization received. Patient not medically for d/c today per physician.   Kathrin Greathouse, Latanya Presser, MSW Clinical Social Worker 5E and Psychiatric Service Line [phone] 08/28/2016  2:05 PM

## 2016-08-29 DIAGNOSIS — E43 Unspecified severe protein-calorie malnutrition: Secondary | ICD-10-CM

## 2016-08-29 DIAGNOSIS — S22080A Wedge compression fracture of T11-T12 vertebra, initial encounter for closed fracture: Secondary | ICD-10-CM | POA: Diagnosis present

## 2016-08-29 DIAGNOSIS — S22060D Wedge compression fracture of T7-T8 vertebra, subsequent encounter for fracture with routine healing: Secondary | ICD-10-CM

## 2016-08-29 DIAGNOSIS — S22000A Wedge compression fracture of unspecified thoracic vertebra, initial encounter for closed fracture: Secondary | ICD-10-CM

## 2016-08-29 DIAGNOSIS — R1084 Generalized abdominal pain: Secondary | ICD-10-CM

## 2016-08-29 DIAGNOSIS — S22060A Wedge compression fracture of T7-T8 vertebra, initial encounter for closed fracture: Secondary | ICD-10-CM | POA: Diagnosis present

## 2016-08-29 DIAGNOSIS — S22080D Wedge compression fracture of T11-T12 vertebra, subsequent encounter for fracture with routine healing: Secondary | ICD-10-CM

## 2016-08-29 LAB — CBC
HCT: 35.1 % — ABNORMAL LOW (ref 36.0–46.0)
Hemoglobin: 11.5 g/dL — ABNORMAL LOW (ref 12.0–15.0)
MCH: 31 pg (ref 26.0–34.0)
MCHC: 32.8 g/dL (ref 30.0–36.0)
MCV: 94.6 fL (ref 78.0–100.0)
PLATELETS: 459 10*3/uL — AB (ref 150–400)
RBC: 3.71 MIL/uL — ABNORMAL LOW (ref 3.87–5.11)
RDW: 13.9 % (ref 11.5–15.5)
WBC: 10.7 10*3/uL — ABNORMAL HIGH (ref 4.0–10.5)

## 2016-08-29 LAB — BASIC METABOLIC PANEL
Anion gap: 7 (ref 5–15)
BUN: 15 mg/dL (ref 6–20)
CALCIUM: 8.6 mg/dL — AB (ref 8.9–10.3)
CO2: 28 mmol/L (ref 22–32)
CREATININE: 0.64 mg/dL (ref 0.44–1.00)
Chloride: 100 mmol/L — ABNORMAL LOW (ref 101–111)
GFR calc Af Amer: >60 mL/min (ref >60)
GLUCOSE: 129 mg/dL — AB (ref 65–99)
POTASSIUM: 4.3 mmol/L (ref 3.5–5.1)
Sodium: 135 mmol/L (ref 135–145)

## 2016-08-29 MED ORDER — LORAZEPAM 0.5 MG PO TABS
0.5000 mg | ORAL_TABLET | Freq: Four times a day (QID) | ORAL | Status: DC | PRN
  Administered 2016-08-29 – 2016-08-31 (×4): 0.5 mg via ORAL
  Filled 2016-08-29 (×4): qty 1

## 2016-08-29 MED ORDER — CEFAZOLIN SODIUM-DEXTROSE 2-4 GM/100ML-% IV SOLN: 2.0000 g | INTRAVENOUS | Status: DC

## 2016-08-29 NOTE — Progress Notes (Signed)
The pt's. Son called asking if she was going to be transferred tonight. Informed him that she will be transferring tomorrow, he replied good that will be better for everyone involved. Also informed him that we had to unplug her phone because she is continuously calling the operator and the front desk but he is more than welcome to call my phone if he needs to speak with her. The pt's son stated that is fine because she has called them over 100 times over the past 2 days.

## 2016-08-29 NOTE — Progress Notes (Signed)
PROGRESS NOTE    Nancy Meyer  ZOX:[id number] DOB: 10-Nov-1920 DOA: 08/27/2016 PCP: Kaleen Mask, MD   Brief Narrative:  Bellamie Turney Blackwoodis a 83 y.o.femalewith history of dementia, hypertension, chronic pain, anxiety and depression, osteoporosis and multiple compression fractures who presented with worsening abdominal pain. Patient previously was able to ambulate with a rolling walker short distanced, but over the last couple of days has been in too much pain to get out of bed. She had a fall two weeks prior to admission which may have resulted in some new thoracic compression fractures. She has been taking lyrica and tramadol for pain. They gave her an enema a 1-2 weeks ago with good results, but since then she may not have been passing BMs. Over the last two days, she has passed a few hard pellets. One episode of emesis earlier this week, but none since although she has complained of nausea. She has been drinking well according to son. She has been complaining of right hip pains and back pains which are chronic but for the last two days, she has had intermittent severe upper abdominal pains which prevent her from getting to bedside commode or even sitting on a bedpain. Worse with palpation and movement. PCP felt pains were possibly due to NSAID gastritis and recommended stopping her naprosyn. Her pain worsened. Denied fevers, always has chills, no sinus congestion, sore throat. Having difficulty voiding today but usually voids frequently and do not feel that they are dehydrated.    Assessment & Plan:   Principal Problem:   Acute respiratory failure with hypoxia (HCC) Active Problems:   Osteoporosis   HTN (hypertension)   Hyperlipidemia   Hyponatremia   Depression   Constipation   Protein-calorie malnutrition, severe (HCC)   Acute urinary retention   Malnutrition of moderate degree   Traumatic compression fracture of T7 thoracic vertebra (HCC)   Traumatic  compression fracture of T11 thoracic vertebra (HCC)   Compression fracture of body of thoracic vertebra (HCC)   Generalized abdominal pain     Acute respiratory failure with hypoxia - due to atelectasis and pain with deep breaths- pain control- currently not hypoxic. -Follow.  Active Problems: Severe constipation - resolved with laxatives- will need to be on Miralax to prevent further constipation while she is taking Tramadol frequently-  - Continue Colace.    Acute urinary retention - resolved with resolution of constipation  Acute chest wall pain - tender in right lower rib cage and all across the upper abdomen - imaging is unrevealing- she is clearly tender to palpation and likely secondary to compression fractures and also muscle/ rib bruising which may have occurred during a recent unwitnessed fall - she has been taking Tramadol at home for this pain - she was taking Naprosyn as well but when GI issues occurred, the family was hesitant to give her more NSAIDS due to suspicion for peptic ulcers - Patient has been started on scheduled Tylenol which will continue. - cont Tramadol and add Voltaren gel  Nausea  - nausea improved with the addition of Pepcid. Follow.  T11/T7 compression fracture - Noted on CT chest and also on MRI of the T-spine which is acute/subacute.  - Patient still with pain. Currently on pain medications.  - Per family patient can barely sit about 5 minutes with complaints of significant pain.   she has had kyphoplastiy in the past of T12, L1 and L 4 - IR consulted and asking patient to be transferred to [facility] in anticipation for vertebroplasty of T7 and kyphoplasty of T11 to be done on 08/31/2016. -    HTN (hypertension) - Blood pressure stable. Continue current regimen of ARB    Hyponatremia - Improved with hydration.   Hypokalemia - due to laxatives/diarrhea - Repleted. Follow.   DVT prophylaxis: Lovenox Code Status: DO NOT  RESUSCITATE Family Communication: Updated patient and family at bedside. Disposition Plan: Transfer to [facility] floor in preparation for T7 vertebroplasty at T11 kyphoplasty by Dr. Corliss Skains.   Consultants:   Interventional radiology 08/29/2016  Procedures:   CT chest 08/28/2016  CT stone protocol 08/27/2016  MRI T-spine 08/28/2016  Plain films of the pelvis 08/27/2016  Chest x-ray 08/27/2016  Antimicrobials:   None   Subjective: Patient laying in bed. Patient states some improvement with back pain however per family patient can barely sit up for 5 minutes with complaints of back pain. Patient states tolerating diet. No nausea. No vomiting.  Objective: Vitals:   08/28/16 1305 08/28/16 2047 08/29/16 0500 08/29/16 1333  BP: (!) 151/76 (!) 151/72 (!) 144/71 (!) 153/72  Pulse: 87 96 86 93  Resp: 18 18 18 18   Temp: 97.8 F (36.6 C) 97.5 F (36.4 C) 98.4 F (36.9 C) 97.7 F (36.5 C)  TempSrc: Oral Oral Oral Oral  SpO2: 100% 98% 93% 95%    Intake/Output Summary (Last 24 hours) at 08/29/16 1736 Last data filed at 08/29/16 1726  Gross per 24 hour  Intake             2400 ml  Output                0 ml  Net             2400 ml   There were no vitals filed for this visit.  Examination:  General exam: Appears calm and comfortable.Kyphosis Respiratory system: Clear to auscultation. Respiratory effort normal. Cardiovascular system: S1 & S2 heard, RRR. No JVD, murmurs, rubs, gallops or clicks. No pedal edema.Chest wall tender to palpation. Gastrointestinal system: Abdomen is nondistended, soft and nontender. No organomegaly or masses felt. Normal bowel sounds heard. Central nervous system: Alert and oriented. No focal neurological deficits. Extremities: Symmetric 5 x 5 power. Skin: No rashes, lesions or ulcers Psychiatry: Judgement and insight appear fair. Mood & affect appropriate.     Data Reviewed: I have personally reviewed following labs and imaging  studies  CBC:  Recent Labs Lab 08/27/16 0531 08/28/16 0718 08/29/16 0730  WBC 9.8 8.6 10.7*  HGB 11.4* 11.5* 11.5*  HCT 32.1* 34.2* 35.1*  MCV 90.7 92.7 94.6  PLT 465* 468* 459*   Basic Metabolic Panel:  Recent Labs Lab 08/27/16 0531 08/28/16 0718 08/28/16 1332 08/29/16 0730  NA 128* 133* 133* 135  K 3.8 2.9* 3.5 4.3  CL 90* 95* 96* 100*  CO2 27 27 28 28   GLUCOSE 98 114* 114* 129*  BUN 15 13 11 15   CREATININE 0.79 0.66 0.61 0.64  CALCIUM 9.3 8.3* 7.9* 8.6*  MG  --  2.5*  --   --    GFR: Estimated Creatinine Clearance: 26.2 mL/min (by C-G formula based on SCr of 0.64 mg/dL). Liver Function Tests:  Recent Labs Lab 08/27/16 0531  AST 24  ALT 14  ALKPHOS 80  BILITOT 0.7  PROT 6.0*  ALBUMIN 3.4*    Recent Labs Lab 08/27/16 0531  LIPASE 20   No results for input(s): AMMONIA in the last 168 hours. Coagulation Profile: No  results for input(s): INR, PROTIME in the last 168 hours. Cardiac Enzymes: No results for input(s): CKTOTAL, CKMB, CKMBINDEX, TROPONINI in the last 168 hours. BNP (last 3 results) No results for input(s): PROBNP in the last 8760 hours. HbA1C: No results for input(s): HGBA1C in the last 72 hours. CBG: No results for input(s): GLUCAP in the last 168 hours. Lipid Profile: No results for input(s): CHOL, HDL, LDLCALC, TRIG, CHOLHDL, LDLDIRECT in the last 72 hours. Thyroid Function Tests: No results for input(s): TSH, T4TOTAL, FREET4, T3FREE, THYROIDAB in the last 72 hours. Anemia Panel: No results for input(s): VITAMINB12, FOLATE, FERRITIN, TIBC, IRON, RETICCTPCT in the last 72 hours. Sepsis Labs: No results for input(s): PROCALCITON, LATICACIDVEN in the last 168 hours.  No results found for this or any previous visit (from the past 240 hour(s)).       Radiology Studies: Ct Chest W Contrast  Result Date: 08/28/2016 CLINICAL DATA:  83 year old hypertensive female with generalized chest pain today. Subsequent encounter. EXAM: CT  CHEST WITH CONTRAST TECHNIQUE: Multidetector CT imaging of the chest was performed during intravenous contrast administration. CONTRAST:  60mL ISOVUE-300 IOPAMIDOL (ISOVUE-300) INJECTION 61% COMPARISON:  08/27/2016 chest x-ray. FINDINGS: Cardiovascular: No pulmonary embolus or aortic dissection. Atherosclerotic changes aorta. Cardiomegaly. Trace pericardial fluid. Mediastinum/Nodes: 1.9 cm right lobe with thyroid lesion of indeterminate etiology. Small hiatal hernia. No mediastinal or hilar adenopathy. Lungs/Pleura: Bibasilar atelectasis without central obstructing process. Anterior inferior right upper lobe atelectasis without central obstructing mass. Scarring apices greater on the right. Minimal pleural thickening. Upper Abdomen: Adrenal gland hyperplasia. Elevated right hemidiaphragm. Musculoskeletal: T7 compression fracture with 75% loss height with slight retropulsion posterior inferior aspect. Age of this fracture is indeterminate. T11 compression fracture predominantly involving superior endplate with fragmentation. 90% loss of height centrally. 60% loss of height anteriorly. 20% loss of height posteriorly. Remote T12 and L1 compression fracture treated with cement augmentation. Dense breast parenchyma bilaterally.  No axillary adenopathy. IMPRESSION: No pulmonary embolus or aortic dissection. Cardiomegaly. Bibasilar and anterior inferior right upper lobe atelectasis without central obstructing mass. Scarring apices greater on the right. T7 compression fracture with 75% loss height with slight retropulsion posterior inferior aspect. Age of this fracture is indeterminate. T11 compression fracture predominantly involving superior endplate with fragmentation. 90% loss of height centrally. 60% loss of height anteriorly. 20% loss of height posteriorly. Remote T12 and L1 compression fracture treated with cement augmentation. 1.9 cm right lobe with thyroid lesion of indeterminate etiology. Small hiatal hernia.  Electronically Signed   By: Lacy DuverneySteven  Olson M.D.   On: 08/28/2016 11:24   Mr Thoracic Spine W Wo Contrast  Result Date: 08/28/2016 CLINICAL DATA:  Thoracic compression fracture EXAM: MRI THORACIC WITHOUT AND WITH CONTRAST TECHNIQUE: Multiplanar and multiecho pulse sequences of the thoracic spine were obtained without and with intravenous contrast. CONTRAST:  8mL MULTIHANCE GADOBENATE DIMEGLUMINE 529 MG/ML IV SOLN COMPARISON:  1. Chest CT 08/28/2016 2. Chest radiograph 22-Mar-202018 3. Thoracic spine radiograph 01/18/2014 FINDINGS: MRI THORACIC SPINE FINDINGS Alignment:  There is S shaped thoracolumbar scoliosis. Vertebrae: There is a compression fracture of the T7 vertebral body with approximately 50% height loss and mild edema. There are compression fractures at T11, T12 and L1 with augmentation material at T12 and L1. There is minimal edema at T11. Hemangioma at T6. Cord:  Normal signal and caliber Paraspinal and other soft tissues: Unremarkable Disc levels: T5-T6: Small central disc protrusion without stenosis. T6-T7: Retropulsion of 2-3 mm is from the T7 compression fracture, but no spinal canal stenosis. T10-T11:  Small central disc protrusion without stenosis. T12-L1: Central disc osteophyte complex mildly narrows the central spinal canal. No herniation or stenosis at the other levels. IMPRESSION: 1. Compression fracture at T7 and with approximately 50% height loss and mild edema, suspected to be late acute to subacute. 2-3 mm retropulsion without spinal canal stenosis. 2. T11 compression fracture without significant edema. This fracture is more likely subacute, though new compared to 01/18/2014 Electronically Signed   By: Deatra Robinson M.D.   On: 08/28/2016 20:36        Scheduled Meds: . acetaminophen  1,000 mg Oral TID  . buPROPion  150 mg Oral BID  . diazepam  1 mg Oral QHS  . diclofenac sodium  2 g Topical QID  . docusate sodium  200 mg Oral BID  . donepezil  10 mg Oral QHS  . enoxaparin  (LOVENOX) injection  20 mg Subcutaneous Q24H  . famotidine  20 mg Oral Daily  . feeding supplement  1 Container Oral TID BM  . losartan  100 mg Oral Q breakfast  . pregabalin  25 mg Oral BID WC  . pregabalin  50 mg Oral QHS   Continuous Infusions: . sodium chloride 75 mL/hr at 08/28/16 0503  . [START ON 08/31/2016]  ceFAZolin (ANCEF) IV       LOS: 1 day    Time spent: 35 mins    Tyrisha Benninger, MD [facility] Pager [phone]  If 7PM-7AM, please contact night-coverage [email or web address] Password [facility] 08/29/2016, 5:36 PM

## 2016-08-29 NOTE — Progress Notes (Signed)
CSW updated [facility], patient not medically stable to d/c today per physician. CSW will continue to follow and assist with discharge.   Vivi BarrackNicole Dabney Dever, Theresia MajorsLCSWA, MSW Clinical Social Worker 5E and Psychiatric Service Line [phone] 08/29/2016  2:17 PM

## 2016-08-29 NOTE — Progress Notes (Signed)
Physical Therapy Treatment Patient Details Name: Nancy JewelBarbara P Meyer MRN: [id number] DOB: 1921/02/13 Today's Date: 08/29/2016    History of Present Illness Nancy JewelBarbara P Meyer is a 83 y.o. female with history of dementia, hypertension, chronic pain, anxiety and depression, osteoporosis and multiple compression fractures who presents with worsening abdominal pain.  Patient previously was able to ambulate with a rolling walker short distanced, but over the last couple of days has been in too much pain to get out of bed.    PT Comments    Pt agreeable and cooperative but fatigues easily.   Follow Up Recommendations  Home health PT;Supervision/Assistance - 24 hour     Equipment Recommendations  None recommended by PT    Recommendations for Other Services       Precautions / Restrictions Precautions Precautions: Fall Restrictions Weight Bearing Restrictions: No    Mobility  Bed Mobility Overal bed mobility: Needs Assistance Bed Mobility: Supine to Sit     Supine to sit: Supervision     General bed mobility comments: Pt to EOB sitting without assist  Transfers Overall transfer level: Needs assistance Equipment used: None;Rolling walker (2 wheeled) Transfers: Sit to/from [employer] CorporationStand;Stand Pivot Transfers Sit to Stand: Min guard Stand pivot transfers: Min guard       General transfer comment: for safety; [facility] E[facility] and to chair  Ambulation/Gait Ambulation/Gait assistance: Min assist Ambulation Distance (Feet): 16 Feet (and additional 12') Assistive device: Rolling walker (2 wheeled) Gait Pattern/deviations: Step-through pattern;Decreased step length - right;Shuffle;Trunk flexed Gait velocity: decr Gait velocity interpretation: Below normal speed for age/gender General Gait Details: cues for posture and position from RW.  Distance ltd by pt c/o LEs getting weakl   Stairs            Wheelchair Mobility    Modified Rankin (Stroke Patients Only)        Balance Overall balance assessment: Needs assistance Sitting-balance support: Feet supported;No upper extremity supported Sitting balance-Leahy Scale: Fair       Standing balance-Leahy Scale: Poor Standing balance comment: requires UE support                            Cognition Arousal/Alertness: Awake/alert Behavior During Therapy: WFL for tasks assessed/performed Overall Cognitive Status: No family/caregiver present to determine baseline cognitive functioning                                 General Comments: decreased memory; unsure of baseline      Exercises      General Comments        Pertinent Vitals/Pain Pain Assessment: Faces Faces Pain Scale: Hurts little more Pain Location: ribs Pain Descriptors / Indicators: Aching Pain Intervention(s): Limited activity within patient's tolerance;Monitored during session;Premedicated before session    Home Living                      Prior Function            PT Goals (current goals can now be found in the care plan section) Acute Rehab PT Goals Patient Stated Goal: none stated PT Goal Formulation: Patient unable to participate in goal setting Time For Goal Achievement: 09/03/16 Potential to Achieve Goals: Good Progress towards PT goals: Progressing toward goals    Frequency    Min 3X/week      PT Plan Current plan remains appropriate  Co-evaluation              AM-PAC PT "6 Clicks" Daily Activity  Outcome Measure  Difficulty turning over in bed (including adjusting bedclothes, sheets and blankets)?: None Difficulty moving from lying on back to sitting on the side of the bed? : A Little Difficulty sitting down on and standing up from a chair with arms (e.g., wheelchair, bedside commode, etc,.)?: A Little Help needed moving to and from a bed to chair (including a wheelchair)?: A Little Help needed walking in hospital room?: A Little Help needed climbing 3-5 steps  with a railing? : A Lot 6 Click Score: 18    End of Session Equipment Utilized During Treatment: Gait belt Activity Tolerance: Patient limited by fatigue Patient left: in chair;with call bell/phone within reach;with chair alarm set Nurse Communication: Mobility status PT Visit Diagnosis: Unsteadiness on feet (R26.81);Muscle weakness (generalized) (M62.81)     Time: 4098-1191 PT Time Calculation (min) (ACUTE ONLY): 30 min  Charges:  $Gait Training: 8-22 mins $Therapeutic Activity: 8-22 mins                    G Codes:       Pg [phone]    Nancy Meyer 08/29/2016, 9:29 AM

## 2016-08-29 NOTE — Progress Notes (Signed)
Patient ID: Nancy JewelBarbara P Kazanjian, female   DOB: 12-17-1920, 83 y.o.   MRN: [id number] Aware of request for kyphoplasty on patient. Dr. Corliss Skainseveshwar to review case and determine candidacy for kyphoplasty. Will issue further recommendations after his review.

## 2016-08-29 NOTE — Progress Notes (Signed)
Referring Physician(s):  Thompson,D  Supervising Physician: Julieanne Cotton  Patient Status:  [facility] - In-pt  Chief Complaint:  Back pain, compression fractures  Subjective: Patient familiar to IR service from prior L3-4 disc aspiration in 2012, as well as L4 VP, L1 KP with biopsy and T12 VP in 2015. Past medical history significant for dementia, hypertension, anxiety/depression, and osteoporosis. She was recently admitted after fall at home with abdominal and upper back pain with radiation to anterior chest as well as constipation. Subsequent imaging revealed: 1. Compression fracture at T7 and with approximately 50% height loss and mild edema, suspected to be late acute to subacute. 2-3 mm retropulsion without spinal canal stenosis. 2. T11 compression fracture without significant edema. This fracture is more likely subacute, though new compared to 01/18/2014  Request now received for VP/KP on pt. She currently denies fever, headache, significant dyspnea, cough, nausea, vomiting or abnormal bleeding.  Allergies: Macrobid [nitrofurantoin]  Medications: Prior to Admission medications   Medication Sig Start Date End Date Taking? Authorizing Provider  acetaminophen (TYLENOL) 650 MG CR tablet Take 650-1,300 mg by mouth every 8 (eight) hours as needed for pain.    Yes [provider]  buPROPion (WELLBUTRIN SR) 150 MG 12 hr tablet Take 150 mg by mouth 2 (two) times daily.    Yes [provider]  calcium citrate-vitamin D (CITRACAL+D) 315-200 MG-UNIT tablet Take 1 tablet by mouth daily with breakfast.   Yes [provider]  cholecalciferol (VITAMIN D) 1000 units tablet Take 1,000 Units by mouth daily with breakfast.   Yes [provider]  diazepam (VALIUM) 2 MG tablet Take 1 mg by mouth at bedtime.    Yes [provider]  docusate sodium (COLACE) 100 MG capsule Take 200 mg by mouth 2 (two) times daily.    Yes [provider]    donepezil (ARICEPT) 10 MG tablet Take 10 mg by mouth at bedtime.    Yes [provider]  loratadine (CLARITIN) 10 MG tablet Take 10 mg by mouth daily with supper.   Yes [provider]  losartan (COZAAR) 100 MG tablet Take 100 mg by mouth daily with breakfast.  07/12/11  Yes [provider]  Melatonin 3 MG TABS Take 3 mg by mouth at bedtime.    Yes [provider]  Multiple Vitamins-Minerals (OCUVITE PRESERVISION PO) Take 1 tablet by mouth 2 (two) times daily.   Yes [provider]  ondansetron (ZOFRAN) 4 MG tablet Take 4 mg by mouth every 8 (eight) hours as needed for nausea or vomiting.   Yes [provider]  polyethylene glycol (CLEARLAX) packet Take 17 g by mouth daily as needed for mild constipation.   Yes [provider]  pregabalin (LYRICA) 25 MG capsule Take 25-50 mg by mouth 3 (three) times daily. Take 25 mg in the morning and at lunch, then 50 mg at bedtime   Yes [provider]  psyllium (METAMUCIL) 58.6 % packet Take 1 packet by mouth daily with breakfast.   Yes [provider]  Simethicone 125 MG TABS Take 1 tablet by mouth every 6 (six) hours as needed (for gas).   Yes [provider]  traMADol (ULTRAM) 50 MG tablet Take 25 mg by mouth 2 (two) times daily as needed for severe pain.    Yes [provider]     Vital Signs: BP (!) 153/72 (BP Location: Left Arm)   Pulse 93   Temp 97.7 F (36.5 C) (Oral)  Resp 18   SpO2 95%   Physical Exam  awake, alert. Chest clear to auscultation bilaterally. Heart with regular rate and rhythm. Abdomen soft, positive bowel sounds, mild right upper abdominal tenderness to palpation, no lower extremity edema, mod paravertebral tenderness at T7/ T11 levels, lat chest wall tenderness bilat Imaging: Ct Chest W Contrast  Result Date: 08/28/2016 CLINICAL DATA:  83 year old hypertensive female with generalized chest pain today. Subsequent encounter. EXAM:  CT CHEST WITH CONTRAST TECHNIQUE: Multidetector CT imaging of the chest was performed during intravenous contrast administration. CONTRAST:  60mL ISOVUE-300 IOPAMIDOL (ISOVUE-300) INJECTION 61% COMPARISON:  08/27/2016 chest x-ray. FINDINGS: Cardiovascular: No pulmonary embolus or aortic dissection. Atherosclerotic changes aorta. Cardiomegaly. Trace pericardial fluid. Mediastinum/Nodes: 1.9 cm right lobe with thyroid lesion of indeterminate etiology. Small hiatal hernia. No mediastinal or hilar adenopathy. Lungs/Pleura: Bibasilar atelectasis without central obstructing process. Anterior inferior right upper lobe atelectasis without central obstructing mass. Scarring apices greater on the right. Minimal pleural thickening. Upper Abdomen: Adrenal gland hyperplasia. Elevated right hemidiaphragm. Musculoskeletal: T7 compression fracture with 75% loss height with slight retropulsion posterior inferior aspect. Age of this fracture is indeterminate. T11 compression fracture predominantly involving superior endplate with fragmentation. 90% loss of height centrally. 60% loss of height anteriorly. 20% loss of height posteriorly. Remote T12 and L1 compression fracture treated with cement augmentation. Dense breast parenchyma bilaterally.  No axillary adenopathy. IMPRESSION: No pulmonary embolus or aortic dissection. Cardiomegaly. Bibasilar and anterior inferior right upper lobe atelectasis without central obstructing mass. Scarring apices greater on the right. T7 compression fracture with 75% loss height with slight retropulsion posterior inferior aspect. Age of this fracture is indeterminate. T11 compression fracture predominantly involving superior endplate with fragmentation. 90% loss of height centrally. 60% loss of height anteriorly. 20% loss of height posteriorly. Remote T12 and L1 compression fracture treated with cement augmentation. 1.9 cm right lobe with thyroid lesion of indeterminate etiology. Small hiatal hernia.  Electronically Signed   By: Lacy Duverney M.D.   On: 08/28/2016 11:24   Mr Thoracic Spine W Wo Contrast  Result Date: 08/28/2016 CLINICAL DATA:  Thoracic compression fracture EXAM: MRI THORACIC WITHOUT AND WITH CONTRAST TECHNIQUE: Multiplanar and multiecho pulse sequences of the thoracic spine were obtained without and with intravenous contrast. CONTRAST:  8mL MULTIHANCE GADOBENATE DIMEGLUMINE 529 MG/ML IV SOLN COMPARISON:  1. Chest CT 08/28/2016 2. Chest radiograph Mar 09, 202018 3. Thoracic spine radiograph 01/18/2014 FINDINGS: MRI THORACIC SPINE FINDINGS Alignment:  There is S shaped thoracolumbar scoliosis. Vertebrae: There is a compression fracture of the T7 vertebral body with approximately 50% height loss and mild edema. There are compression fractures at T11, T12 and L1 with augmentation material at T12 and L1. There is minimal edema at T11. Hemangioma at T6. Cord:  Normal signal and caliber Paraspinal and other soft tissues: Unremarkable Disc levels: T5-T6: Small central disc protrusion without stenosis. T6-T7: Retropulsion of 2-3 mm is from the T7 compression fracture, but no spinal canal stenosis. T10-T11:  Small central disc protrusion without stenosis. T12-L1: Central disc osteophyte complex mildly narrows the central spinal canal. No herniation or stenosis at the other levels. IMPRESSION: 1. Compression fracture at T7 and with approximately 50% height loss and mild edema, suspected to be late acute to subacute. 2-3 mm retropulsion without spinal canal stenosis. 2. T11 compression fracture without significant edema. This fracture is more likely subacute, though new compared to 01/18/2014 Electronically Signed   By: Deatra Robinson M.D.   On: 08/28/2016 20:36   Dg Chest [facility] 1 [address]  Result Date: 08/27/2016 CLINICAL DATA:  Hypoxia. EXAM: PORTABLE CHEST 1 VIEW COMPARISON:  2020/04/416 . FINDINGS: Mediastinum is normal. Left lower lobe atelectasis/ infiltrate noted. Mild right perihilar infiltrate cannot be  excluded. Small left pleural effusion cannot be excluded. No pneumothorax. Heart size stable. IMPRESSION: 1. Left lower lobe atelectasis and infiltrate. Small left pleural effusion. 2.  Mild right perihilar infiltrate cannot be excluded. Electronically Signed   By: Maisie Fushomas  Register   On: 08/27/2016 09:18   Ct Renal Stone Study  Result Date: 08/27/2016 CLINICAL DATA:  Right lower quadrant pain with nausea and vomiting. Polyuria. EXAM: CT ABDOMEN AND PELVIS WITHOUT CONTRAST TECHNIQUE: Multidetector CT imaging of the abdomen and pelvis was performed following the standard protocol without IV contrast. COMPARISON:  1. MRI thoracic lumbar spine 01/04/2014 2. CT abdomen pelvis 12/14/2013 FINDINGS: Lower chest: Atelectasis/ scarring at the right lung base. Subsegmental of lower lobe atelectasis. Small pericardial effusion. Hepatobiliary: Normal noncontrast appearance of the liver. No visible biliary dilatation. Normal gallbladder. Pancreas: Normal noncontrast appearance of the pancreas. No peripancreatic fluid collection. Spleen: Normal. Adrenals/Urinary Tract: --Adrenal glands: Left adrenal gland is mildly enlarged but maintains its adreniform shape. --Right kidney/ureter: No hydronephrosis or perinephric stranding. No nephrolithiasis. There is a small, intermediate attenuation focus near the upper pole that is likely a hemorrhagic cyst but is technically too small to characterize accurately. --Left kidney/ureter: No hydronephrosis or perinephric stranding. No nephrolithiasis. No obstructing ureteral stones. --Urinary bladder: Unremarkable. Stomach/Bowel: No small bowel obstruction. By report, the appendix is surgically absent. There is rectosigmoid diverticulosis without acute inflammation. Vascular/Lymphatic: There is atherosclerotic calcification of the non aneurysmal abdominal aorta. No abdominal or pelvic lymphadenopathy. Reproductive: Normal uterus.  No adnexal mass. Musculoskeletal. There is extensive  degenerative disease throughout the visualized spine. The patient is status post vertebral augmentation T12, L1 and L4. There is also a chronic compression fracture of T11. There are fractures of the left superior and inferior pubic rami that appear chronic. There is marked lumbar levoscoliosis. IMPRESSION: 1. No acute abdominal or pelvic abnormality. 2. Small pericardial effusion and aortic atherosclerosis. 3. Multiple chronic thoracolumbar compression fractures with prior multilevel vertebral augmentation. Electronically Signed   By: Deatra RobinsonKevin  Herman M.D.   On: 08/27/2016 06:24   Dg Hip Unilat With Pelvis Min 4 Views Right  Result Date: 08/27/2016 CLINICAL DATA:  Pain.  Patient reports right lower quadrant pain. EXAM: DG HIP (WITH OR WITHOUT PELVIS) 4+V RIGHT COMPARISON:  CT performed concurrently. FINDINGS: No evidence of acute fracture of the pelvis or right hip. Remote fracture of the left pubic body is better characterized on concurrent CT. Enthesopathic changes adjacent to the right hip greater trochanter. Both femoral heads are well-seated in the respective acetabula. The bones are under mineralized. Scoliosis in the included lumbar spine. IMPRESSION: 1. No evidence of acute osseous abnormality of the pelvis or right hip. 2. Enthesopathy of the right greater trochanter. Electronically Signed   By: Rubye OaksMelanie  Ehinger M.D.   On: 08/27/2016 06:00    Labs:  CBC:  Recent Labs  08/27/16 0531 08/28/16 0718 08/29/16 0730  WBC 9.8 8.6 10.7*  HGB 11.4* 11.5* 11.5*  HCT 32.1* 34.2* 35.1*  PLT 465* 468* 459*    COAGS: No results for input(s): INR, APTT in the last 8760 hours.  BMP:  Recent Labs  08/27/16 0531 08/28/16 0718 08/28/16 1332 08/29/16 0730  NA 128* 133* 133* 135  K 3.8 2.9* 3.5 4.3  CL 90* 95* 96* 100*  CO2 27 27 28  28  GLUCOSE 98 114* 114* 129*  BUN 15 13 11 15   CALCIUM 9.3 8.3* 7.9* 8.6*  CREATININE 0.79 0.66 0.61 0.64  GFRNONAA >60 >60 >60 >60  GFRAA >60 >60 >60 >60     LIVER FUNCTION TESTS:  Recent Labs  08/27/16 0531  BILITOT 0.7  AST 24  ALT 14  ALKPHOS 80  PROT 6.0*  ALBUMIN 3.4*    Assessment and Plan: Patient with history of dementia, hypertension, anxiety/depression ,osteoporosis with prior L4 VP, L1 KP and T12 VP in 2015 by Dr. Corliss Skains. Now presents with recent fall, abdominal/upper back/lateral chest discomfort and findings of acute to subacute T7 and T11 compression fractures. Imaging has been reviewed by Dr.  Corliss Skains and he feels patient is a candidate for T7 vertebroplasty and T11 kyphoplasty. Details/risks of procedures, including but not limited to, internal bleeding, infection, nerve injury, cement migration, inability to eradicate pain, discussed with patient and family with their understanding and consent. Earliest procedure can be done by Dr. Corliss Skains is on 6/8 at [facility]. Will check insurance authorization for the procedure. The above plans were discussed with ordering physician as well for consideration of transfer to [facility] prior to procedure.   Electronically Signed: D. Jeananne Rama, PA-C 08/29/2016, 4:28 PM   I spent a total of 30 minutes at the the patient's bedside AND on the patient's hospital floor or unit, greater than 50% of which was counseling/coordinating care for thoracic vertebroplasty/kyphoplasty    Patient ID: Nancy Meyer, female   DOB: 05-18-1920, 83 y.o.   MRN: [id number]

## 2016-08-30 LAB — URINALYSIS, ROUTINE W REFLEX MICROSCOPIC
Bilirubin Urine: NEGATIVE
Bilirubin Urine: NEGATIVE
Glucose, UA: NEGATIVE mg/dL
Glucose, UA: NEGATIVE mg/dL
Hgb urine dipstick: NEGATIVE
Ketones, ur: NEGATIVE mg/dL
Ketones, ur: NEGATIVE mg/dL
Nitrite: NEGATIVE
Nitrite: NEGATIVE
PROTEIN: 100 mg/dL — AB
Protein, ur: NEGATIVE mg/dL
SPECIFIC GRAVITY, URINE: 1.029 (ref 1.005–1.030)
Specific Gravity, Urine: 1.004 — ABNORMAL LOW (ref 1.005–1.030)
Squamous Epithelial / LPF: NONE SEEN
pH: 7 (ref 5.0–8.0)
pH: 5 (ref 5.0–8.0)

## 2016-08-30 NOTE — Progress Notes (Signed)
[facility]. Pt denies pain at the time of discharge and report has been called to [id number].

## 2016-08-30 NOTE — Progress Notes (Signed)
Referring Physician(s):  Ramiro Harvest, MD  Supervising Physician: Julieanne Cotton  Patient Status:  [facility] - In-pt  Chief Complaint:  Back pain, T7 and T11 compression fractures  Subjective: Confused this afternoon.  Urinary frequency overnight.   Allergies: Macrobid [nitrofurantoin]  Medications: Prior to Admission medications   Medication Sig Start Date End Date Taking? Authorizing Provider  acetaminophen (TYLENOL) 650 MG CR tablet Take 650-1,300 mg by mouth every 8 (eight) hours as needed for pain.    Yes [provider]  buPROPion (WELLBUTRIN SR) 150 MG 12 hr tablet Take 150 mg by mouth 2 (two) times daily.    Yes [provider]  calcium citrate-vitamin D (CITRACAL+D) 315-200 MG-UNIT tablet Take 1 tablet by mouth daily with breakfast.   Yes [provider]  cholecalciferol (VITAMIN D) 1000 units tablet Take 1,000 Units by mouth daily with breakfast.   Yes [provider]  diazepam (VALIUM) 2 MG tablet Take 1 mg by mouth at bedtime.    Yes [provider]  docusate sodium (COLACE) 100 MG capsule Take 200 mg by mouth 2 (two) times daily.    Yes [provider]  donepezil (ARICEPT) 10 MG tablet Take 10 mg by mouth at bedtime.    Yes [provider]  loratadine (CLARITIN) 10 MG tablet Take 10 mg by mouth daily with supper.   Yes [provider]  losartan (COZAAR) 100 MG tablet Take 100 mg by mouth daily with breakfast.  07/12/11  Yes [provider]  Melatonin 3 MG TABS Take 3 mg by mouth at bedtime.    Yes [provider]  Multiple Vitamins-Minerals (OCUVITE PRESERVISION PO) Take 1 tablet by mouth 2 (two) times daily.   Yes [provider]  ondansetron (ZOFRAN) 4 MG tablet Take 4 mg by mouth every 8 (eight) hours as needed for nausea or vomiting.   Yes [provider]  polyethylene glycol (CLEARLAX) packet Take 17 g by mouth daily as needed for mild constipation.   Yes  [provider]  pregabalin (LYRICA) 25 MG capsule Take 25-50 mg by mouth 3 (three) times daily. Take 25 mg in the morning and at lunch, then 50 mg at bedtime   Yes [provider]  psyllium (METAMUCIL) 58.6 % packet Take 1 packet by mouth daily with breakfast.   Yes [provider]  Simethicone 125 MG TABS Take 1 tablet by mouth every 6 (six) hours as needed (for gas).   Yes [provider]  traMADol (ULTRAM) 50 MG tablet Take 25 mg by mouth 2 (two) times daily as needed for severe pain.    Yes [provider]     Vital Signs: BP (!) 119/54 (BP Location: Left Arm)   Pulse 94   Temp 98.7 F (37.1 C) (Axillary)   Resp 18   SpO2 98%   Physical Exam  NAD, alert. Chest:  No shortness of breath, no respiratory distress. Heart:  Regular, rate, and rhythm Abdomen:  Soft, lateral chest wall tenderness  Imaging: Ct Chest W Contrast  Result Date: 08/28/2016 CLINICAL DATA:  83 year old hypertensive female with generalized chest pain today. Subsequent encounter. EXAM: CT CHEST WITH CONTRAST TECHNIQUE: Multidetector CT imaging of the chest was performed during intravenous contrast administration. CONTRAST:  60mL ISOVUE-300 IOPAMIDOL (ISOVUE-300) INJECTION 61% COMPARISON:  08/27/2016 chest x-ray. FINDINGS: Cardiovascular: No pulmonary embolus or aortic dissection. Atherosclerotic changes aorta. Cardiomegaly. Trace pericardial fluid. Mediastinum/Nodes: 1.9 cm right lobe with thyroid lesion of indeterminate etiology. Small  hiatal hernia. No mediastinal or hilar adenopathy. Lungs/Pleura: Bibasilar atelectasis without central obstructing process. Anterior inferior right upper lobe atelectasis without central obstructing mass. Scarring apices greater on the right. Minimal pleural thickening. Upper Abdomen: Adrenal gland hyperplasia. Elevated right hemidiaphragm. Musculoskeletal: T7 compression fracture with 75% loss height with slight retropulsion posterior inferior  aspect. Age of this fracture is indeterminate. T11 compression fracture predominantly involving superior endplate with fragmentation. 90% loss of height centrally. 60% loss of height anteriorly. 20% loss of height posteriorly. Remote T12 and L1 compression fracture treated with cement augmentation. Dense breast parenchyma bilaterally.  No axillary adenopathy. IMPRESSION: No pulmonary embolus or aortic dissection. Cardiomegaly. Bibasilar and anterior inferior right upper lobe atelectasis without central obstructing mass. Scarring apices greater on the right. T7 compression fracture with 75% loss height with slight retropulsion posterior inferior aspect. Age of this fracture is indeterminate. T11 compression fracture predominantly involving superior endplate with fragmentation. 90% loss of height centrally. 60% loss of height anteriorly. 20% loss of height posteriorly. Remote T12 and L1 compression fracture treated with cement augmentation. 1.9 cm right lobe with thyroid lesion of indeterminate etiology. Small hiatal hernia. Electronically Signed   By: Lacy Duverney M.D.   On: 08/28/2016 11:24   Mr Thoracic Spine W Wo Contrast  Result Date: 08/28/2016 CLINICAL DATA:  Thoracic compression fracture EXAM: MRI THORACIC WITHOUT AND WITH CONTRAST TECHNIQUE: Multiplanar and multiecho pulse sequences of the thoracic spine were obtained without and with intravenous contrast. CONTRAST:  8mL MULTIHANCE GADOBENATE DIMEGLUMINE 529 MG/ML IV SOLN COMPARISON:  1. Chest CT 08/28/2016 2. Chest radiograph 08/24/2016 3. Thoracic spine radiograph 01/18/2014 FINDINGS: MRI THORACIC SPINE FINDINGS Alignment:  There is S shaped thoracolumbar scoliosis. Vertebrae: There is a compression fracture of the T7 vertebral body with approximately 50% height loss and mild edema. There are compression fractures at T11, T12 and L1 with augmentation material at T12 and L1. There is minimal edema at T11. Hemangioma at T6. Cord:  Normal signal and  caliber Paraspinal and other soft tissues: Unremarkable Disc levels: T5-T6: Small central disc protrusion without stenosis. T6-T7: Retropulsion of 2-3 mm is from the T7 compression fracture, but no spinal canal stenosis. T10-T11:  Small central disc protrusion without stenosis. T12-L1: Central disc osteophyte complex mildly narrows the central spinal canal. No herniation or stenosis at the other levels. IMPRESSION: 1. Compression fracture at T7 and with approximately 50% height loss and mild edema, suspected to be late acute to subacute. 2-3 mm retropulsion without spinal canal stenosis. 2. T11 compression fracture without significant edema. This fracture is more likely subacute, though new compared to 01/18/2014 Electronically Signed   By: Deatra Robinson M.D.   On: 08/28/2016 20:36   Dg Chest Port 1 View  Result Date: 08/27/2016 CLINICAL DATA:  Hypoxia. EXAM: PORTABLE CHEST 1 VIEW COMPARISON:  08/24/2016 . FINDINGS: Mediastinum is normal. Left lower lobe atelectasis/ infiltrate noted. Mild right perihilar infiltrate cannot be excluded. Small left pleural effusion cannot be excluded. No pneumothorax. Heart size stable. IMPRESSION: 1. Left lower lobe atelectasis and infiltrate. Small left pleural effusion. 2.  Mild right perihilar infiltrate cannot be excluded. Electronically Signed   By: Maisie Fus  Register   On: 08/27/2016 09:18   Ct Renal Stone Study  Result Date: 08/27/2016 CLINICAL DATA:  Right lower quadrant pain with nausea and vomiting. Polyuria. EXAM: CT ABDOMEN AND PELVIS WITHOUT CONTRAST TECHNIQUE: Multidetector CT imaging of the abdomen and pelvis was performed following the standard protocol without IV contrast. COMPARISON:  1. MRI thoracic lumbar  spine 01/04/2014 2. CT abdomen pelvis 12/14/2013 FINDINGS: Lower chest: Atelectasis/ scarring at the right lung base. Subsegmental of lower lobe atelectasis. Small pericardial effusion. Hepatobiliary: Normal noncontrast appearance of the liver. No visible  biliary dilatation. Normal gallbladder. Pancreas: Normal noncontrast appearance of the pancreas. No peripancreatic fluid collection. Spleen: Normal. Adrenals/Urinary Tract: --Adrenal glands: Left adrenal gland is mildly enlarged but maintains its adreniform shape. --Right kidney/ureter: No hydronephrosis or perinephric stranding. No nephrolithiasis. There is a small, intermediate attenuation focus near the upper pole that is likely a hemorrhagic cyst but is technically too small to characterize accurately. --Left kidney/ureter: No hydronephrosis or perinephric stranding. No nephrolithiasis. No obstructing ureteral stones. --Urinary bladder: Unremarkable. Stomach/Bowel: No small bowel obstruction. By report, the appendix is surgically absent. There is rectosigmoid diverticulosis without acute inflammation. Vascular/Lymphatic: There is atherosclerotic calcification of the non aneurysmal abdominal aorta. No abdominal or pelvic lymphadenopathy. Reproductive: Normal uterus.  No adnexal mass. Musculoskeletal. There is extensive degenerative disease throughout the visualized spine. The patient is status post vertebral augmentation T12, L1 and L4. There is also a chronic compression fracture of T11. There are fractures of the left superior and inferior pubic rami that appear chronic. There is marked lumbar levoscoliosis. IMPRESSION: 1. No acute abdominal or pelvic abnormality. 2. Small pericardial effusion and aortic atherosclerosis. 3. Multiple chronic thoracolumbar compression fractures with prior multilevel vertebral augmentation. Electronically Signed   By: Deatra RobinsonKevin  Herman M.D.   On: 08/27/2016 06:24   Dg Hip Unilat With Pelvis Min 4 Views Right  Result Date: 08/27/2016 CLINICAL DATA:  Pain.  Patient reports right lower quadrant pain. EXAM: DG HIP (WITH OR WITHOUT PELVIS) 4+V RIGHT COMPARISON:  CT performed concurrently. FINDINGS: No evidence of acute fracture of the pelvis or right hip. Remote fracture of the left  pubic body is better characterized on concurrent CT. Enthesopathic changes adjacent to the right hip greater trochanter. Both femoral heads are well-seated in the respective acetabula. The bones are under mineralized. Scoliosis in the included lumbar spine. IMPRESSION: 1. No evidence of acute osseous abnormality of the pelvis or right hip. 2. Enthesopathy of the right greater trochanter. Electronically Signed   By: Rubye OaksMelanie  Ehinger M.D.   On: 08/27/2016 06:00    Labs:  CBC:  Recent Labs  08/27/16 0531 08/28/16 0718 08/29/16 0730  WBC 9.8 8.6 10.7*  HGB 11.4* 11.5* 11.5*  HCT 32.1* 34.2* 35.1*  PLT 465* 468* 459*    COAGS: No results for input(s): INR, APTT in the last 8760 hours.  BMP:  Recent Labs  08/27/16 0531 08/28/16 0718 08/28/16 1332 08/29/16 0730  NA 128* 133* 133* 135  K 3.8 2.9* 3.5 4.3  CL 90* 95* 96* 100*  CO2 27 27 28 28   GLUCOSE 98 114* 114* 129*  BUN 15 13 11 15   CALCIUM 9.3 8.3* 7.9* 8.6*  CREATININE 0.79 0.66 0.61 0.64  GFRNONAA >60 >60 >60 >60  GFRAA >60 >60 >60 >60    LIVER FUNCTION TESTS:  Recent Labs  08/27/16 0531  BILITOT 0.7  AST 24  ALT 14  ALKPHOS 80  PROT 6.0*  ALBUMIN 3.4*    Assessment and Plan: Compression fracture at T7 and T11 [facility] vertebroplasty/kyphoplasty of T7 and T11.  Case has been reviewed and approved.  Consent in chart.  Note patient with urinary frequency overnight. UA drawn this AM.  Discussed with Dr. Ella JubileeArrien and Dr. Corliss Skainseveshwar.  Plan to repeat UA and CBC tomorrow morning.  NPO after midnight.  Anticipate procedure tomorrow pending clinical status.  Electronically Signed: Hoyt Koch, PA 08/30/2016, 4:24 PM   I spent a total of 15 Minutes at the the patient's bedside AND on the patient's hospital floor or unit, greater than 50% of which was counseling/coordinating care for T7 and T11 compression fractures.

## 2016-08-30 NOTE — Progress Notes (Signed)
PROGRESS NOTE    Nancy Meyer  WUJ:[id number] DOB: 25-Feb-1921 DOA: 08/27/2016 PCP: Kaleen Mask, MD    Brief Narrative:  83 yo female presented with abdominal pain. Patient known to have dementia, HTN, osteoporosis and chronic pain. Patient had a fall 2 weeks and resulted in thoracic compression fracture. On the initial evaluation hemodynamically stable. Admitted for pain control.   Assessment & Plan:   Principal Problem:   Acute respiratory failure with hypoxia (HCC) Active Problems:   Osteoporosis   HTN (hypertension)   Hyperlipidemia   Hyponatremia   Depression   Constipation   Protein-calorie malnutrition, severe (HCC)   Acute urinary retention   Malnutrition of moderate degree   Traumatic compression fracture of T7 thoracic vertebra (HCC)   Traumatic compression fracture of T11 thoracic vertebra (HCC)   Compression fracture of body of thoracic vertebra (HCC)   Generalized abdominal pain   1. Acute compression fractures T11/T7. Will continue pain control, patient has been evaluated by IR with recommendations for vertebroplasty and kyphoplasty. Will be transferred to [facility] for procedure. dvt px  2. Hypoxic respiratory failure due to atelectasis. Will continue oxymetry monitoring and incentive spirometry. Oxygen supplementation per [place]  3. Urine retention. UA with no signs of infection, continue bowel regimen, no need for antibiotic therapy at this point.   4. HTN. Continue blood pressure control.   DVT prophylaxis: enoxaparin  Code Status: full  Family Communication: no family at the bedside  Disposition Plan: home   Consultants:   IR  Procedures:   Antimicrobials:    Subjective: Patient with back pain, moderate in intensity, worse with movement, no radiation. Confusion improved this am.   Objective: Vitals:   08/29/16 0500 08/29/16 1333 08/29/16 2229 08/30/16 0258  BP: (!) 144/71 (!) 153/72 (!) 167/60 (!) 147/78  Pulse: 86 93 100 94  Resp: 18  18 20 18   Temp: 98.4 F (36.9 C) 97.7 F (36.5 C) 97.8 F (36.6 C) 97.5 F (36.4 C)  TempSrc: Oral Oral Oral Oral  SpO2: 93% 95% 98% 94%    Intake/Output Summary (Last 24 hours) at 08/30/16 0914 Last data filed at 08/30/16 0850  Gross per 24 hour  Intake             2280 ml  Output             1325 ml  Net              955 ml   There were no vitals filed for this visit.  Examination:  General exam: deconditioned E ENT. No pallor or icterus, oral mucosa moist.  Respiratory system: Clear to auscultation. Respiratory effort normal. Cardiovascular system: S1 & S2 heard, RRR. No JVD, murmurs, rubs, gallops or clicks. No pedal edema. Gastrointestinal system: Abdomen is nondistended, soft and nontender. No organomegaly or masses felt. Normal bowel sounds heard. Central nervous system: Alert and oriented. No focal neurological deficits. Extremities: Symmetric 5 x 5 power. Skin: No rashes, lesions or ulcers     Data Reviewed: I have personally reviewed following labs and imaging studies  CBC:  Recent Labs Lab 08/27/16 0531 08/28/16 0718 08/29/16 0730  WBC 9.8 8.6 10.7*  HGB 11.4* 11.5* 11.5*  HCT 32.1* 34.2* 35.1*  MCV 90.7 92.7 94.6  PLT 465* 468* 459*   Basic Metabolic Panel:  Recent Labs Lab 08/27/16 0531 08/28/16 0718 08/28/16 1332 08/29/16 0730  NA 128* 133* 133* 135  K 3.8 2.9* 3.5 4.3  CL 90* 95* 96*  100*  CO2 27 27 28 28   GLUCOSE 98 114* 114* 129*  BUN 15 13 11 15   CREATININE 0.79 0.66 0.61 0.64  CALCIUM 9.3 8.3* 7.9* 8.6*  MG  --  2.5*  --   --    GFR: Estimated Creatinine Clearance: 26.2 mL/min (by C-G formula based on SCr of 0.64 mg/dL). Liver Function Tests:  Recent Labs Lab 08/27/16 0531  AST 24  ALT 14  ALKPHOS 80  BILITOT 0.7  PROT 6.0*  ALBUMIN 3.4*    Recent Labs Lab 08/27/16 0531  LIPASE 20   No results for input(s): AMMONIA in the last 168 hours. Coagulation Profile: No results for input(s): INR, PROTIME in the last 168  hours. Cardiac Enzymes: No results for input(s): CKTOTAL, CKMB, CKMBINDEX, TROPONINI in the last 168 hours. BNP (last 3 results) No results for input(s): PROBNP in the last 8760 hours. HbA1C: No results for input(s): HGBA1C in the last 72 hours. CBG: No results for input(s): GLUCAP in the last 168 hours. Lipid Profile: No results for input(s): CHOL, HDL, LDLCALC, TRIG, CHOLHDL, LDLDIRECT in the last 72 hours. Thyroid Function Tests: No results for input(s): TSH, T4TOTAL, FREET4, T3FREE, THYROIDAB in the last 72 hours. Anemia Panel: No results for input(s): VITAMINB12, FOLATE, FERRITIN, TIBC, IRON, RETICCTPCT in the last 72 hours. Sepsis Labs: No results for input(s): PROCALCITON, LATICACIDVEN in the last 168 hours.  No results found for this or any previous visit (from the past 240 hour(s)).       Radiology Studies: Ct Chest W Contrast  Result Date: 08/28/2016 CLINICAL DATA:  83 year old hypertensive female with generalized chest pain today. Subsequent encounter. EXAM: CT CHEST WITH CONTRAST TECHNIQUE: Multidetector CT imaging of the chest was performed during intravenous contrast administration. CONTRAST:  60mL ISOVUE-300 IOPAMIDOL (ISOVUE-300) INJECTION 61% COMPARISON:  08/27/2016 chest x-ray. FINDINGS: Cardiovascular: No pulmonary embolus or aortic dissection. Atherosclerotic changes aorta. Cardiomegaly. Trace pericardial fluid. Mediastinum/Nodes: 1.9 cm right lobe with thyroid lesion of indeterminate etiology. Small hiatal hernia. No mediastinal or hilar adenopathy. Lungs/Pleura: Bibasilar atelectasis without central obstructing process. Anterior inferior right upper lobe atelectasis without central obstructing mass. Scarring apices greater on the right. Minimal pleural thickening. Upper Abdomen: Adrenal gland hyperplasia. Elevated right hemidiaphragm. Musculoskeletal: T7 compression fracture with 75% loss height with slight retropulsion posterior inferior aspect. Age of this fracture  is indeterminate. T11 compression fracture predominantly involving superior endplate with fragmentation. 90% loss of height centrally. 60% loss of height anteriorly. 20% loss of height posteriorly. Remote T12 and L1 compression fracture treated with cement augmentation. Dense breast parenchyma bilaterally.  No axillary adenopathy. IMPRESSION: No pulmonary embolus or aortic dissection. Cardiomegaly. Bibasilar and anterior inferior right upper lobe atelectasis without central obstructing mass. Scarring apices greater on the right. T7 compression fracture with 75% loss height with slight retropulsion posterior inferior aspect. Age of this fracture is indeterminate. T11 compression fracture predominantly involving superior endplate with fragmentation. 90% loss of height centrally. 60% loss of height anteriorly. 20% loss of height posteriorly. Remote T12 and L1 compression fracture treated with cement augmentation. 1.9 cm right lobe with thyroid lesion of indeterminate etiology. Small hiatal hernia. Electronically Signed   By: Lacy Duverney M.D.   On: 08/28/2016 11:24   Mr Thoracic Spine W Wo Contrast  Result Date: 08/28/2016 CLINICAL DATA:  Thoracic compression fracture EXAM: MRI THORACIC WITHOUT AND WITH CONTRAST TECHNIQUE: Multiplanar and multiecho pulse sequences of the thoracic spine were obtained without and with intravenous contrast. CONTRAST:  8mL MULTIHANCE GADOBENATE DIMEGLUMINE 529  MG/ML IV SOLN COMPARISON:  1. Chest CT 08/28/2016 2. Chest radiograph 2020-06-1916 3. Thoracic spine radiograph 01/18/2014 FINDINGS: MRI THORACIC SPINE FINDINGS Alignment:  There is S shaped thoracolumbar scoliosis. Vertebrae: There is a compression fracture of the T7 vertebral body with approximately 50% height loss and mild edema. There are compression fractures at T11, T12 and L1 with augmentation material at T12 and L1. There is minimal edema at T11. Hemangioma at T6. Cord:  Normal signal and caliber Paraspinal and other soft  tissues: Unremarkable Disc levels: T5-T6: Small central disc protrusion without stenosis. T6-T7: Retropulsion of 2-3 mm is from the T7 compression fracture, but no spinal canal stenosis. T10-T11:  Small central disc protrusion without stenosis. T12-L1: Central disc osteophyte complex mildly narrows the central spinal canal. No herniation or stenosis at the other levels. IMPRESSION: 1. Compression fracture at T7 and with approximately 50% height loss and mild edema, suspected to be late acute to subacute. 2-3 mm retropulsion without spinal canal stenosis. 2. T11 compression fracture without significant edema. This fracture is more likely subacute, though new compared to 01/18/2014 Electronically Signed   By: Deatra RobinsonKevin  Herman M.D.   On: 08/28/2016 20:36        Scheduled Meds: . acetaminophen  1,000 mg Oral TID  . buPROPion  150 mg Oral BID  . diazepam  1 mg Oral QHS  . diclofenac sodium  2 g Topical QID  . docusate sodium  200 mg Oral BID  . donepezil  10 mg Oral QHS  . enoxaparin (LOVENOX) injection  20 mg Subcutaneous Q24H  . famotidine  20 mg Oral Daily  . feeding supplement  1 Container Oral TID BM  . losartan  100 mg Oral Q breakfast  . pregabalin  25 mg Oral BID WC  . pregabalin  50 mg Oral QHS   Continuous Infusions: . [START ON 08/31/2016]  ceFAZolin (ANCEF) IV       LOS: 2 days        Carmela Piechowski Annett Gulaaniel Josey Forcier, MD [facility] Pager [phone]  If 7PM-7AM, please contact night-coverage [email or web address] Password [email or web address] 08/30/2016, 9:14 AM

## 2016-08-30 NOTE — Progress Notes (Signed)
Page: Rosalita Chessmanm [phone] Rosaura CarpenterBlackwood has been urinating CYU nonstop Q15-7020min all night. Had 202 residual on scan. Please advise

## 2016-08-31 DIAGNOSIS — F322 Major depressive disorder, single episode, severe without psychotic features: Secondary | ICD-10-CM | POA: Diagnosis not present

## 2016-08-31 DIAGNOSIS — R54 Age-related physical debility: Secondary | ICD-10-CM | POA: Diagnosis not present

## 2016-08-31 DIAGNOSIS — F0390 Unspecified dementia without behavioral disturbance: Secondary | ICD-10-CM | POA: Diagnosis not present

## 2016-08-31 DIAGNOSIS — R451 Restlessness and agitation: Secondary | ICD-10-CM | POA: Diagnosis not present

## 2016-08-31 DIAGNOSIS — J9600 Acute respiratory failure, unspecified whether with hypoxia or hypercapnia: Secondary | ICD-10-CM | POA: Diagnosis not present

## 2016-08-31 DIAGNOSIS — M4850XA Collapsed vertebra, not elsewhere classified, site unspecified, initial encounter for fracture: Secondary | ICD-10-CM | POA: Diagnosis not present

## 2016-08-31 DIAGNOSIS — J9601 Acute respiratory failure with hypoxia: Secondary | ICD-10-CM | POA: Diagnosis not present

## 2016-08-31 DIAGNOSIS — R531 Weakness: Secondary | ICD-10-CM | POA: Diagnosis not present

## 2016-08-31 DIAGNOSIS — M6281 Muscle weakness (generalized): Secondary | ICD-10-CM | POA: Diagnosis not present

## 2016-08-31 DIAGNOSIS — G629 Polyneuropathy, unspecified: Secondary | ICD-10-CM | POA: Diagnosis not present

## 2016-08-31 DIAGNOSIS — M4854XD Collapsed vertebra, not elsewhere classified, thoracic region, subsequent encounter for fracture with routine healing: Secondary | ICD-10-CM | POA: Diagnosis not present

## 2016-08-31 DIAGNOSIS — R293 Abnormal posture: Secondary | ICD-10-CM | POA: Diagnosis not present

## 2016-08-31 DIAGNOSIS — R2681 Unsteadiness on feet: Secondary | ICD-10-CM | POA: Diagnosis not present

## 2016-08-31 DIAGNOSIS — R0902 Hypoxemia: Secondary | ICD-10-CM | POA: Diagnosis not present

## 2016-08-31 DIAGNOSIS — E43 Unspecified severe protein-calorie malnutrition: Secondary | ICD-10-CM | POA: Diagnosis not present

## 2016-08-31 DIAGNOSIS — F411 Generalized anxiety disorder: Secondary | ICD-10-CM | POA: Diagnosis not present

## 2016-08-31 DIAGNOSIS — R41841 Cognitive communication deficit: Secondary | ICD-10-CM | POA: Diagnosis not present

## 2016-08-31 DIAGNOSIS — N390 Urinary tract infection, site not specified: Secondary | ICD-10-CM | POA: Diagnosis not present

## 2016-08-31 LAB — BASIC METABOLIC PANEL
Anion gap: 6 (ref 5–15)
BUN: 14 mg/dL (ref 6–20)
CO2: 27 mmol/L (ref 22–32)
CREATININE: 0.62 mg/dL (ref 0.44–1.00)
Calcium: 8.6 mg/dL — ABNORMAL LOW (ref 8.9–10.3)
Chloride: 102 mmol/L (ref 101–111)
GFR calc non Af Amer: >60 mL/min (ref >60)
Glucose, Bld: 108 mg/dL — ABNORMAL HIGH (ref 65–99)
Potassium: 3.5 mmol/L (ref 3.5–5.1)
Sodium: 135 mmol/L (ref 135–145)

## 2016-08-31 LAB — URINALYSIS, COMPLETE (UACMP) WITH MICROSCOPIC
BILIRUBIN URINE: NEGATIVE
GLUCOSE, UA: NEGATIVE mg/dL
HGB URINE DIPSTICK: NEGATIVE
Ketones, ur: NEGATIVE mg/dL
Nitrite: NEGATIVE
PH: 7 (ref 5.0–8.0)
Protein, ur: 30 mg/dL — AB
SPECIFIC GRAVITY, URINE: 1.011 (ref 1.005–1.030)

## 2016-08-31 LAB — CBC WITH DIFFERENTIAL/PLATELET
BASOS PCT: 1 %
Basophils Absolute: 0 10*3/uL (ref 0.0–0.1)
Eosinophils Absolute: 0.2 10*3/uL (ref 0.0–0.7)
Eosinophils Relative: 2 %
HEMATOCRIT: 33.2 % — AB (ref 36.0–46.0)
HEMOGLOBIN: 10.7 g/dL — AB (ref 12.0–15.0)
LYMPHS ABS: 1.4 10*3/uL (ref 0.7–4.0)
Lymphocytes Relative: 21 %
MCH: 30.4 pg (ref 26.0–34.0)
MCHC: 32.2 g/dL (ref 30.0–36.0)
MCV: 94.3 fL (ref 78.0–100.0)
MONOS PCT: 10 %
Monocytes Absolute: 0.7 10*3/uL (ref 0.1–1.0)
NEUTROS ABS: 4.3 10*3/uL (ref 1.7–7.7)
NEUTROS PCT: 66 %
Platelets: 398 10*3/uL (ref 150–400)
RBC: 3.52 MIL/uL — AB (ref 3.87–5.11)
RDW: 13.8 % (ref 11.5–15.5)
WBC: 6.6 10*3/uL (ref 4.0–10.5)

## 2016-08-31 LAB — PROTIME-INR
INR: 1
Prothrombin Time: 13.2 seconds (ref 11.4–15.2)

## 2016-08-31 MED ORDER — POLYETHYLENE GLYCOL 3350 17 G PO PACK
17.0000 g | PACK | Freq: Every day | ORAL | Status: DC
  Administered 2016-08-31: 17 g via ORAL

## 2016-08-31 MED ORDER — CEPHALEXIN 500 MG PO CAPS: 500.0000 mg | ORAL_CAPSULE | Freq: Two times a day (BID) | ORAL | 0 refills | Status: DC

## 2016-08-31 MED ORDER — CEPHALEXIN 500 MG PO CAPS
500.0000 mg | ORAL_CAPSULE | Freq: Two times a day (BID) | ORAL | Status: DC
  Administered 2016-08-31: 500 mg via ORAL
  Filled 2016-08-31: qty 1

## 2016-08-31 MED ORDER — FAMOTIDINE 20 MG PO TABS: 20.0000 mg | ORAL_TABLET | Freq: Every day | ORAL | 0 refills | Status: AC

## 2016-08-31 MED ORDER — TRAMADOL HCL 50 MG PO TABS: 25.0000 mg | ORAL_TABLET | Freq: Four times a day (QID) | ORAL | 0 refills | Status: AC | PRN

## 2016-08-31 MED ORDER — CEPHALEXIN 500 MG PO CAPS: 500.0000 mg | ORAL_CAPSULE | Freq: Two times a day (BID) | ORAL | 0 refills | Status: AC

## 2016-08-31 MED ORDER — BOOST / RESOURCE BREEZE PO LIQD: 1.0000 | Freq: Three times a day (TID) | ORAL | 0 refills | Status: DC

## 2016-08-31 NOTE — Clinical Social Work Placement (Signed)
   CLINICAL SOCIAL WORK PLACEMENT  NOTE  Date:  08/31/2016  Patient Details  Name: Nancy Meyer MRN: [id number] Date of Birth: 11-19-1920  Clinical Social Work is seeking post-discharge placement for this patient at the Skilled  Nursing Facility level of care (*CSW will initial, date and re-position this form in  chart as items are completed):  Yes   Patient/family provided with [facility] Clinical Social Work Department's list of facilities offering this level of care within the geographic area requested by the patient (or if unable, by the patient's family).  Yes   Patient/family informed of their freedom to choose among providers that offer the needed level of care, that participate in Medicare, Medicaid or managed care program needed by the patient, have an available bed and are willing to accept the patient.  Yes   Patient/family informed of [facility]'s ownership interest in [facility] and [facility], as well as of the fact that they are under no obligation to receive care at these facilities.  PASRR submitted to EDS on       PASRR number received on       Existing PASRR number confirmed on 08/27/16     FL2 transmitted to all facilities in geographic area requested by pt/family on 08/27/16     FL2 transmitted to all facilities within larger geographic area on 08/31/16     Patient informed that his/her managed care company has contracts with or will negotiate with certain facilities, including the following:        Yes   Patient/family informed of bed offers received.  Patient chooses bed at [facility] [place]     Physician recommends and patient chooses bed at      Patient to be transferred to [facility] [place] on 08/31/16.  Patient to be transferred to facility by family     Patient family notified on 08/31/16 of transfer.  Name of family member notified:  Nancy Meyer     PHYSICIAN Please sign FL2     Additional Comment:     _______________________________________________ Burna SisUris, Elizar Alpern H, LCSW 08/31/2016, 3:02 PM

## 2016-08-31 NOTE — Progress Notes (Signed)
OT Cancellation Note  Patient Details Name: Darrell JewelBarbara P Beam MRN: [id number] DOB: [birth year]   Cancelled Treatment:    Reason Eval/Treat Not Completed: Other (comment) (Pt scheduled for kyphoplasty. Will attempt treatment when pt is agreeable and available.)  Alen BleacherBradsher, Alyzza Andringa P, MS, OTR/L, CBIS 08/31/2016, 12:18 PM

## 2016-08-31 NOTE — Progress Notes (Signed)
CSW informed that now patient is ready for DC today and they will not be doing kyphoplasty.  CSW called [facility] they have bed for pt today.  CSW initiated [employer] authorization for today.  CSW will continue to follow  Burna SisJenna H. Zeric Baranowski, LCSW Clinical Social Worker [phone]

## 2016-08-31 NOTE — Care Management Important Message (Signed)
Important Message  Patient Details  Name: Nancy JewelBarbara P Hearld MRN: [id number] Date of Birth: [birth year]   Medicare Important Message Given:       [name] 08/31/2016, 12:03 PM

## 2016-08-31 NOTE — Progress Notes (Signed)
PT Cancellation Note  Patient Details Name: Nancy JewelBarbara P Witzke MRN: [id number] DOB: February 03, 1921   Cancelled Treatment:    Reason Eval/Treat Not Completed: Other (comment).  Pt has a kyphoplasty scheduled, family in and pt asking for meds, declined getting OOB to walk with PT.  Will check again after procedure.   Ivar DrapeRuth E Maribella Kuna 08/31/2016, 11:59 AM   Samul Dadauth Isolde Skaff, PT MS Acute Rehab Dept. Number: [facility] [id number] and [facility] [phone]

## 2016-08-31 NOTE — Progress Notes (Signed)
Patient will discharge to [facility] Anticipated discharge date: 6/8 Family notified: at bedside Transportation by family  CSW signing off.  Burna SisJenna H. Serge Main, LCSW Clinical Social Worker [phone]

## 2016-08-31 NOTE — Discharge Summary (Signed)
[facility] Discharge Summary   Patient: Nancy Meyer ZOX:[id number]   PCP: Kaleen Mask, MD DOB: 06/28/1920   Date of admission: 08/27/2016   Date of discharge:  08/31/2016    Discharge Diagnoses:  Principal Problem:   Acute respiratory failure with hypoxia (HCC) Active Problems:   Osteoporosis   HTN (hypertension)   Hyperlipidemia   Hyponatremia   Depression   Constipation   Protein-calorie malnutrition, severe (HCC)   Acute urinary retention   Malnutrition of moderate degree   Traumatic compression fracture of T7 thoracic vertebra (HCC)   Traumatic compression fracture of T11 thoracic vertebra (HCC)   Compression fracture of body of thoracic vertebra (HCC)   Generalized abdominal pain   Admitted From: home Disposition:  SNF  Recommendations for Outpatient Follow-up:  1.  Please follow-up with PCP in one week. 2. Please call radiology department for scheduling kyphoplasty if you do not hear back from them until Tuesday.  Follow-up Information    Kaleen Mask, MD Follow up.   Specialty:  Family Medicine Contact information: [address] [place] [address] [phone]          Diet recommendation: regular deit  Activity: The patient is advised to gradually reintroduce usual activities.  Discharge Condition: good  Code Status: DNR/DNI  History of present illness: As per the H and P dictated on admission, "Nancy Meyer is a 83 y.o. female with history of dementia, hypertension, chronic pain, anxiety and depression, osteoporosis and multiple compression fractures who presents with worsening abdominal pain.  Patient previously was able to ambulate with a rolling walker short distanced, but over the last couple of days has been in too much pain to get out of bed.  She had a fall two weeks ago which may have resulted in some new thoracic compression fractures.  She has been taking lyrica and tramadol for pain.  They gave her  an enema a 1-2 weeks ago with good results, but since then she may not have been passing BMs.  Over the last two days, she has passed a few hard pellets.  One episode of emesis earlier this week, but none since although she has complained of nausea.  She has been drinking well according to son.  She has been complaining of right hip pains and back pains which are chronic but for the last two days, she has had intermittent severe upper abdominal pains which prevent her from getting to bedside commode or even sitting on a bedpain.  Worse with palpation and movement.  PCP felt pains were possibly due to NSAID gastritis and recommended stopping her naprosyn.  Her pain worsened. Denies fevers, always has chills, no sinus congestion, sore throat.  Having difficulty voiding today but usually voids frequently and do not feel that they are dehydrated.  "  Hospital Course:  Summary of her active problems in the hospital is as following.  Acute respiratory failure with hypoxia, likely due to atelectasis and splinting from abdominal pain.  No fevers or leukocytosis or symptoms of pneumonia -  Incentive spirometry -  Control pain -  CXR:  Possible LLL pneumonia, however, I think this is due to rotation.  CT did not capture infiltrate at left base. - Currently on room air  Abdominal pain, likely due to combination of stool impaction/severe constipation and acute urinary retention related to stool Continue stool softeners on discharge. Currently resolved.  Generalized weakness, inability to ambulate Compression fracture subacute. Interventional radiology was consulted, initial  plan was to perform kyphoplasty inpatient but due to concern for possible UTI kyphoplasty has been postponed until next week. Radiology department to arrange for the procedure with Dr. Joanell Rising.  Anxiety and depression -  Continue valium and wellbutrin  Dementia  -  Continue aricept  Essential hypertension, blood pressures  elevated, likely due to pain and stress -  Continue ARB  Hyponatremia,  Currently resolved.  Anemia of chronic disease -  hgb at baseline 11.4 g/dl, currently stable  Suspected UTI. Interventional radiology felt that the patient may have a possible UTI given her UA shows evidence of increased WBC. Also she has some confusion as well. Currently patient will be treated with 3 days of Keflex to ensure resolution of possible UTI and well as to arrange for procedure kyphoplasty.  All other chronic medical condition were stable during the hospitalization.  Patient was een by physical therapy, who recommended SNF, which was arranged by Child psychotherapist and case Production designer, theatre/television/film. On the day of the discharge the patient's vitals were stable, and no other acute medical condition were reported by patient. the patient was felt safe to be discharge at SNF with therapt.  Procedures and Results:  none   Consultations:  Interventional radiology  DISCHARGE MEDICATION: Current Discharge Medication List    START taking these medications   Details  cephALEXin (KEFLEX) 500 MG capsule Take 1 capsule (500 mg total) by mouth every 12 (twelve) hours. Qty: 6 capsule, Refills: 0    famotidine (PEPCID) 20 MG tablet Take 1 tablet (20 mg total) by mouth daily. Qty: 30 tablet, Refills: 0    feeding supplement (BOOST / RESOURCE BREEZE) LIQD Take 1 Container by mouth 3 (three) times daily between meals. Qty: 30 Container, Refills: 0      CONTINUE these medications which have CHANGED   Details  traMADol (ULTRAM) 50 MG tablet Take 0.5 tablets (25 mg total) by mouth every 6 (six) hours as needed for severe pain. Qty: 20 tablet, Refills: 0      CONTINUE these medications which have NOT CHANGED   Details  acetaminophen (TYLENOL) 650 MG CR tablet Take 650-1,300 mg by mouth every 8 (eight) hours as needed for pain.     buPROPion (WELLBUTRIN SR) 150 MG 12 hr tablet Take 150 mg by mouth 2 (two) times daily.       calcium citrate-vitamin D (CITRACAL+D) 315-200 MG-UNIT tablet Take 1 tablet by mouth daily with breakfast.    cholecalciferol (VITAMIN D) 1000 units tablet Take 1,000 Units by mouth daily with breakfast.    diazepam (VALIUM) 2 MG tablet Take 1 mg by mouth at bedtime.     docusate sodium (COLACE) 100 MG capsule Take 200 mg by mouth 2 (two) times daily.     donepezil (ARICEPT) 10 MG tablet Take 10 mg by mouth at bedtime.     loratadine (CLARITIN) 10 MG tablet Take 10 mg by mouth daily with supper.    losartan (COZAAR) 100 MG tablet Take 100 mg by mouth daily with breakfast.     Melatonin 3 MG TABS Take 3 mg by mouth at bedtime.     Multiple Vitamins-Minerals (OCUVITE PRESERVISION PO) Take 1 tablet by mouth 2 (two) times daily.    ondansetron (ZOFRAN) 4 MG tablet Take 4 mg by mouth every 8 (eight) hours as needed for nausea or vomiting.    polyethylene glycol (CLEARLAX) packet Take 17 g by mouth daily as needed for mild constipation.    pregabalin (LYRICA) 25 MG capsule  Take 25-50 mg by mouth 3 (three) times daily. Take 25 mg in the morning and at lunch, then 50 mg at bedtime    psyllium (METAMUCIL) 58.6 % packet Take 1 packet by mouth daily with breakfast.    Simethicone 125 MG TABS Take 1 tablet by mouth every 6 (six) hours as needed (for gas).       Allergies  Allergen Reactions  . Macrobid [Nitrofurantoin] Itching   Discharge Instructions    Diet - low sodium heart healthy    Complete by:  As directed    Discharge instructions    Complete by:  As directed    It is important that you read following instructions as well as go over your medication list with RN to help you understand your care after this hospitalization.  Discharge Instructions: Please follow-up with PCP in one week  Please request your primary care physician to go over all Hospital Tests and Procedure/Radiological results at the follow up,  Please get all Hospital records sent to your PCP by signing  hospital release before you go home.   Do not take more than prescribed Pain, Sleep and Anxiety Medications. You were cared for by a hospitalist during your hospital stay. If you have any questions about your discharge medications or the care you received while you were in the hospital after you are discharged, you can call the unit and ask to speak with the hospitalist on call if the hospitalist that took care of you is not available.  Once you are discharged, your primary care physician will handle any further medical issues. Please note that NO REFILLS for any discharge medications will be authorized once you are discharged, as it is imperative that you return to your primary care physician (or establish a relationship with a primary care physician if you do not have one) for your aftercare needs so that they can reassess your need for medications and monitor your lab values. You Must read complete instructions/literature along with all the possible adverse reactions/side effects for all the Medicines you take and that have been prescribed to you. Take any new Medicines after you have completely understood and accept all the possible adverse reactions/side effects. Wear Seat belts while driving.   Increase activity slowly    Complete by:  As directed      Discharge Exam: There were no vitals filed for this visit. Vitals:   08/30/16 2058 08/31/16 0426  BP: 120/73 (!) 142/83  Pulse: 91 (!) 106  Resp:  16  Temp: 98.7 F (37.1 C) 98 F (36.7 C)   General: Appear in mild distress, no Rash; Oral Mucosa moist. Cardiovascular: S1 and S2 Present, no Murmur, no JVD Respiratory: Bilateral Air entry present and Clear to Auscultation, no Crackles, no wheezes Abdomen: Bowel Sound present, Soft and no tenderness Extremities: no Pedal edema, no calf tenderness Neurology: Grossly no focal neuro deficit.  The results of significant diagnostics from this hospitalization (including imaging,  microbiology, ancillary and laboratory) are listed below for reference.    Significant Diagnostic Studies: Dg Chest 2 View  Result Date: 2020-01-317 CLINICAL DATA:  Generalized chest pain ; history of osteoporosis and kyphosis. EXAM: CHEST  2 VIEW COMPARISON:  Chest x-ray of January 17, 2014 FINDINGS: The lungs are adequately inflated. There is no focal infiltrate. There is stable scarring in the lower third of the right lung. There is no pleural effusion. The heart and pulmonary vascularity are normal. The mediastinum is normal in width. There  is calcification in the wall of the thoracic aorta. There is moderate curvature convex toward the left centered at the thoracolumbar junction. The patient has undergone previous kyphoplasty at approximately T12 and L1. There is wedge compression of T7 and T11. IMPRESSION: New 60% compressions of the bodies of approximately T7 and T11 since the previous chest x-ray and thoracic spine series in 2015. Previous kyphoplasties at approximately T12 and L1. No acute cardiopulmonary abnormality. Thoracic aortic atherosclerosis. Electronically Signed   By: David  [place] M.D.   On: August 09, 202018 08:45   Ct Chest W Contrast  Result Date: 08/28/2016 CLINICAL DATA:  83 year old hypertensive female with generalized chest pain today. Subsequent encounter. EXAM: CT CHEST WITH CONTRAST TECHNIQUE: Multidetector CT imaging of the chest was performed during intravenous contrast administration. CONTRAST:  60mL ISOVUE-300 IOPAMIDOL (ISOVUE-300) INJECTION 61% COMPARISON:  08/27/2016 chest x-ray. FINDINGS: Cardiovascular: No pulmonary embolus or aortic dissection. Atherosclerotic changes aorta. Cardiomegaly. Trace pericardial fluid. Mediastinum/Nodes: 1.9 cm right lobe with thyroid lesion of indeterminate etiology. Small hiatal hernia. No mediastinal or hilar adenopathy. Lungs/Pleura: Bibasilar atelectasis without central obstructing process. Anterior inferior right upper lobe atelectasis without  central obstructing mass. Scarring apices greater on the right. Minimal pleural thickening. Upper Abdomen: Adrenal gland hyperplasia. Elevated right hemidiaphragm. Musculoskeletal: T7 compression fracture with 75% loss height with slight retropulsion posterior inferior aspect. Age of this fracture is indeterminate. T11 compression fracture predominantly involving superior endplate with fragmentation. 90% loss of height centrally. 60% loss of height anteriorly. 20% loss of height posteriorly. Remote T12 and L1 compression fracture treated with cement augmentation. Dense breast parenchyma bilaterally.  No axillary adenopathy. IMPRESSION: No pulmonary embolus or aortic dissection. Cardiomegaly. Bibasilar and anterior inferior right upper lobe atelectasis without central obstructing mass. Scarring apices greater on the right. T7 compression fracture with 75% loss height with slight retropulsion posterior inferior aspect. Age of this fracture is indeterminate. T11 compression fracture predominantly involving superior endplate with fragmentation. 90% loss of height centrally. 60% loss of height anteriorly. 20% loss of height posteriorly. Remote T12 and L1 compression fracture treated with cement augmentation. 1.9 cm right lobe with thyroid lesion of indeterminate etiology. Small hiatal hernia. Electronically Signed   By: Lacy Duverney M.D.   On: 08/28/2016 11:24   Mr Thoracic Spine W Wo Contrast  Result Date: 08/28/2016 CLINICAL DATA:  Thoracic compression fracture EXAM: MRI THORACIC WITHOUT AND WITH CONTRAST TECHNIQUE: Multiplanar and multiecho pulse sequences of the thoracic spine were obtained without and with intravenous contrast. CONTRAST:  8mL MULTIHANCE GADOBENATE DIMEGLUMINE 529 MG/ML IV SOLN COMPARISON:  1. Chest CT 08/28/2016 2. Chest radiograph August 09, 202018 3. Thoracic spine radiograph 01/18/2014 FINDINGS: MRI THORACIC SPINE FINDINGS Alignment:  There is S shaped thoracolumbar scoliosis. Vertebrae: There is a  compression fracture of the T7 vertebral body with approximately 50% height loss and mild edema. There are compression fractures at T11, T12 and L1 with augmentation material at T12 and L1. There is minimal edema at T11. Hemangioma at T6. Cord:  Normal signal and caliber Paraspinal and other soft tissues: Unremarkable Disc levels: T5-T6: Small central disc protrusion without stenosis. T6-T7: Retropulsion of 2-3 mm is from the T7 compression fracture, but no spinal canal stenosis. T10-T11:  Small central disc protrusion without stenosis. T12-L1: Central disc osteophyte complex mildly narrows the central spinal canal. No herniation or stenosis at the other levels. IMPRESSION: 1. Compression fracture at T7 and with approximately 50% height loss and mild edema, suspected to be late acute to subacute. 2-3 mm retropulsion without spinal canal  stenosis. 2. T11 compression fracture without significant edema. This fracture is more likely subacute, though new compared to 01/18/2014 Electronically Signed   By: Deatra Robinson M.D.   On: 08/28/2016 20:36   Dg Chest Port 1 View  Result Date: 08/27/2016 CLINICAL DATA:  Hypoxia. EXAM: PORTABLE CHEST 1 VIEW COMPARISON:  2020/06/216 . FINDINGS: Mediastinum is normal. Left lower lobe atelectasis/ infiltrate noted. Mild right perihilar infiltrate cannot be excluded. Small left pleural effusion cannot be excluded. No pneumothorax. Heart size stable. IMPRESSION: 1. Left lower lobe atelectasis and infiltrate. Small left pleural effusion. 2.  Mild right perihilar infiltrate cannot be excluded. Electronically Signed   By: Maisie Fus  Register   On: 08/27/2016 09:18   Ct Renal Stone Study  Result Date: 08/27/2016 CLINICAL DATA:  Right lower quadrant pain with nausea and vomiting. Polyuria. EXAM: CT ABDOMEN AND PELVIS WITHOUT CONTRAST TECHNIQUE: Multidetector CT imaging of the abdomen and pelvis was performed following the standard protocol without IV contrast. COMPARISON:  1. MRI thoracic  lumbar spine 01/04/2014 2. CT abdomen pelvis 12/14/2013 FINDINGS: Lower chest: Atelectasis/ scarring at the right lung base. Subsegmental of lower lobe atelectasis. Small pericardial effusion. Hepatobiliary: Normal noncontrast appearance of the liver. No visible biliary dilatation. Normal gallbladder. Pancreas: Normal noncontrast appearance of the pancreas. No peripancreatic fluid collection. Spleen: Normal. Adrenals/Urinary Tract: --Adrenal glands: Left adrenal gland is mildly enlarged but maintains its adreniform shape. --Right kidney/ureter: No hydronephrosis or perinephric stranding. No nephrolithiasis. There is a small, intermediate attenuation focus near the upper pole that is likely a hemorrhagic cyst but is technically too small to characterize accurately. --Left kidney/ureter: No hydronephrosis or perinephric stranding. No nephrolithiasis. No obstructing ureteral stones. --Urinary bladder: Unremarkable. Stomach/Bowel: No small bowel obstruction. By report, the appendix is surgically absent. There is rectosigmoid diverticulosis without acute inflammation. Vascular/Lymphatic: There is atherosclerotic calcification of the non aneurysmal abdominal aorta. No abdominal or pelvic lymphadenopathy. Reproductive: Normal uterus.  No adnexal mass. Musculoskeletal. There is extensive degenerative disease throughout the visualized spine. The patient is status post vertebral augmentation T12, L1 and L4. There is also a chronic compression fracture of T11. There are fractures of the left superior and inferior pubic rami that appear chronic. There is marked lumbar levoscoliosis. IMPRESSION: 1. No acute abdominal or pelvic abnormality. 2. Small pericardial effusion and aortic atherosclerosis. 3. Multiple chronic thoracolumbar compression fractures with prior multilevel vertebral augmentation. Electronically Signed   By: Deatra Robinson M.D.   On: 08/27/2016 06:24   Dg Hip Unilat With Pelvis Min 4 Views Right  Result Date:  08/27/2016 CLINICAL DATA:  Pain.  Patient reports right lower quadrant pain. EXAM: DG HIP (WITH OR WITHOUT PELVIS) 4+V RIGHT COMPARISON:  CT performed concurrently. FINDINGS: No evidence of acute fracture of the pelvis or right hip. Remote fracture of the left pubic body is better characterized on concurrent CT. Enthesopathic changes adjacent to the right hip greater trochanter. Both femoral heads are well-seated in the respective acetabula. The bones are under mineralized. Scoliosis in the included lumbar spine. IMPRESSION: 1. No evidence of acute osseous abnormality of the pelvis or right hip. 2. Enthesopathy of the right greater trochanter. Electronically Signed   By: Rubye Oaks M.D.   On: 08/27/2016 06:00    Microbiology: No results found for this or any previous visit (from the past 240 hour(s)).   Labs: CBC:  Recent Labs Lab 08/27/16 0531 08/28/16 0718 08/29/16 0730 08/31/16 0730  WBC 9.8 8.6 10.7* 6.6  NEUTROABS  --   --   --  4.3  HGB 11.4* 11.5* 11.5* 10.7*  HCT 32.1* 34.2* 35.1* 33.2*  MCV 90.7 92.7 94.6 94.3  PLT 465* 468* 459* 398   Basic Metabolic Panel:  Recent Labs Lab 08/27/16 0531 08/28/16 0718 08/28/16 1332 08/29/16 0730 08/31/16 0730  NA 128* 133* 133* 135 135  K 3.8 2.9* 3.5 4.3 3.5  CL 90* 95* 96* 100* 102  CO2 27 27 28 28 27   GLUCOSE 98 114* 114* 129* 108*  BUN 15 13 11 15 14   CREATININE 0.79 0.66 0.61 0.64 0.62  CALCIUM 9.3 8.3* 7.9* 8.6* 8.6*  MG  --  2.5*  --   --   --    Liver Function Tests:  Recent Labs Lab 08/27/16 0531  AST 24  ALT 14  ALKPHOS 80  BILITOT 0.7  PROT 6.0*  ALBUMIN 3.4*    Recent Labs Lab 08/27/16 0531  LIPASE 20   Time spent: 50 minutes  Signed:  Harman Langhans  [facility]  08/31/2016  , 2:33 PM

## 2016-09-02 DIAGNOSIS — M4850XA Collapsed vertebra, not elsewhere classified, site unspecified, initial encounter for fracture: Secondary | ICD-10-CM | POA: Diagnosis not present

## 2016-09-02 DIAGNOSIS — F322 Major depressive disorder, single episode, severe without psychotic features: Secondary | ICD-10-CM | POA: Diagnosis not present

## 2016-09-02 DIAGNOSIS — J9600 Acute respiratory failure, unspecified whether with hypoxia or hypercapnia: Secondary | ICD-10-CM | POA: Diagnosis not present

## 2016-09-02 DIAGNOSIS — E43 Unspecified severe protein-calorie malnutrition: Secondary | ICD-10-CM | POA: Diagnosis not present

## 2016-09-03 ENCOUNTER — Other Ambulatory Visit (HOSPITAL_COMMUNITY): Payer: Self-pay | Admitting: Interventional Radiology

## 2016-09-03 DIAGNOSIS — IMO0001 Reserved for inherently not codable concepts without codable children: Secondary | ICD-10-CM

## 2016-09-03 DIAGNOSIS — M4850XA Collapsed vertebra, not elsewhere classified, site unspecified, initial encounter for fracture: Principal | ICD-10-CM

## 2016-09-05 ENCOUNTER — Other Ambulatory Visit: Payer: Self-pay | Admitting: Radiology

## 2016-09-06 ENCOUNTER — Ambulatory Visit (HOSPITAL_COMMUNITY): Admission: RE | Admit: 2016-09-06 | Payer: PPO | Source: Ambulatory Visit

## 2016-09-10 ENCOUNTER — Other Ambulatory Visit: Payer: Self-pay | Admitting: Radiology

## 2016-09-10 ENCOUNTER — Telehealth: Payer: Self-pay | Admitting: General Surgery

## 2016-09-10 NOTE — Progress Notes (Signed)
Patient has not had repeat I&O catheterization.  I spoke to [facility] Surgery CenterBarbara's daughter, Elita Quickam, regarding whether they want to proceed with this procedure or not given her age, dementia, and at times minimal back pain.  The daughter would like to talk to her brother as well as Dr. Corliss Skainseveshwar regarding whether they want to move forward or not.  We will contact them to discuss.  Terriyah Westra E

## 2016-09-11 ENCOUNTER — Ambulatory Visit (HOSPITAL_COMMUNITY): Admission: RE | Admit: 2016-09-11 | Payer: PPO | Source: Ambulatory Visit

## 2016-09-11 ENCOUNTER — Telehealth (HOSPITAL_COMMUNITY): Payer: Self-pay | Admitting: Radiology

## 2016-09-11 NOTE — Telephone Encounter (Signed)
Spoke to pt's son, Nancy Meyer today concerning possible treatment of pt's compression fractures. He would like to proceed with KP/VP if warranted. We are awaiting a UA from [facility]. Once those results are back we will decide future treatment from that point. Son agrees to this plan. JM

## 2016-09-13 DIAGNOSIS — G629 Polyneuropathy, unspecified: Secondary | ICD-10-CM | POA: Diagnosis not present

## 2016-09-13 DIAGNOSIS — N390 Urinary tract infection, site not specified: Secondary | ICD-10-CM | POA: Diagnosis not present

## 2016-09-13 DIAGNOSIS — F411 Generalized anxiety disorder: Secondary | ICD-10-CM | POA: Diagnosis not present

## 2016-09-13 DIAGNOSIS — R451 Restlessness and agitation: Secondary | ICD-10-CM | POA: Diagnosis not present

## 2016-09-17 DIAGNOSIS — R531 Weakness: Secondary | ICD-10-CM | POA: Diagnosis not present

## 2016-09-20 ENCOUNTER — Other Ambulatory Visit (HOSPITAL_COMMUNITY): Payer: Self-pay | Admitting: Interventional Radiology

## 2016-09-20 DIAGNOSIS — IMO0001 Reserved for inherently not codable concepts without codable children: Secondary | ICD-10-CM

## 2016-09-20 DIAGNOSIS — M4850XA Collapsed vertebra, not elsewhere classified, site unspecified, initial encounter for fracture: Principal | ICD-10-CM

## 2016-09-21 ENCOUNTER — Telehealth (HOSPITAL_COMMUNITY): Payer: Self-pay

## 2016-09-21 NOTE — Telephone Encounter (Signed)
Returned sons phone call. Left message to call back. AW

## 2016-09-23 DIAGNOSIS — R2689 Other abnormalities of gait and mobility: Secondary | ICD-10-CM | POA: Diagnosis not present

## 2016-09-23 DIAGNOSIS — F411 Generalized anxiety disorder: Secondary | ICD-10-CM | POA: Diagnosis not present

## 2016-09-23 DIAGNOSIS — R0902 Hypoxemia: Secondary | ICD-10-CM | POA: Diagnosis not present

## 2016-09-23 DIAGNOSIS — F0390 Unspecified dementia without behavioral disturbance: Secondary | ICD-10-CM | POA: Diagnosis not present

## 2016-09-23 DIAGNOSIS — R079 Chest pain, unspecified: Secondary | ICD-10-CM | POA: Diagnosis not present

## 2016-09-23 DIAGNOSIS — M8008XD Age-related osteoporosis with current pathological fracture, vertebra(e), subsequent encounter for fracture with routine healing: Secondary | ICD-10-CM | POA: Diagnosis not present

## 2016-09-23 DIAGNOSIS — E876 Hypokalemia: Secondary | ICD-10-CM | POA: Diagnosis not present

## 2016-09-23 DIAGNOSIS — F418 Other specified anxiety disorders: Secondary | ICD-10-CM | POA: Diagnosis not present

## 2016-09-23 DIAGNOSIS — R41841 Cognitive communication deficit: Secondary | ICD-10-CM | POA: Diagnosis not present

## 2016-09-23 DIAGNOSIS — R293 Abnormal posture: Secondary | ICD-10-CM | POA: Diagnosis not present

## 2016-09-23 DIAGNOSIS — M6281 Muscle weakness (generalized): Secondary | ICD-10-CM | POA: Diagnosis not present

## 2016-09-23 DIAGNOSIS — Z87440 Personal history of urinary (tract) infections: Secondary | ICD-10-CM | POA: Diagnosis not present

## 2016-09-23 DIAGNOSIS — Z9181 History of falling: Secondary | ICD-10-CM | POA: Diagnosis not present

## 2016-09-23 DIAGNOSIS — N390 Urinary tract infection, site not specified: Secondary | ICD-10-CM | POA: Diagnosis not present

## 2016-09-23 DIAGNOSIS — R54 Age-related physical debility: Secondary | ICD-10-CM | POA: Diagnosis not present

## 2016-09-23 DIAGNOSIS — R2681 Unsteadiness on feet: Secondary | ICD-10-CM | POA: Diagnosis not present

## 2016-09-23 DIAGNOSIS — I10 Essential (primary) hypertension: Secondary | ICD-10-CM | POA: Diagnosis not present

## 2016-09-25 DIAGNOSIS — M6281 Muscle weakness (generalized): Secondary | ICD-10-CM | POA: Diagnosis not present

## 2016-09-25 DIAGNOSIS — Z9181 History of falling: Secondary | ICD-10-CM | POA: Diagnosis not present

## 2016-09-25 DIAGNOSIS — Z87440 Personal history of urinary (tract) infections: Secondary | ICD-10-CM | POA: Diagnosis not present

## 2016-09-25 DIAGNOSIS — I10 Essential (primary) hypertension: Secondary | ICD-10-CM | POA: Diagnosis not present

## 2016-09-25 DIAGNOSIS — F418 Other specified anxiety disorders: Secondary | ICD-10-CM | POA: Diagnosis not present

## 2016-09-25 DIAGNOSIS — F0390 Unspecified dementia without behavioral disturbance: Secondary | ICD-10-CM | POA: Diagnosis not present

## 2016-09-25 DIAGNOSIS — M8008XD Age-related osteoporosis with current pathological fracture, vertebra(e), subsequent encounter for fracture with routine healing: Secondary | ICD-10-CM | POA: Diagnosis not present

## 2016-09-25 DIAGNOSIS — R2689 Other abnormalities of gait and mobility: Secondary | ICD-10-CM | POA: Diagnosis not present

## 2016-09-26 DIAGNOSIS — E43 Unspecified severe protein-calorie malnutrition: Secondary | ICD-10-CM | POA: Diagnosis not present

## 2016-09-26 DIAGNOSIS — F322 Major depressive disorder, single episode, severe without psychotic features: Secondary | ICD-10-CM | POA: Diagnosis not present

## 2016-09-26 DIAGNOSIS — M4850XA Collapsed vertebra, not elsewhere classified, site unspecified, initial encounter for fracture: Secondary | ICD-10-CM | POA: Diagnosis not present

## 2016-09-26 DIAGNOSIS — J9600 Acute respiratory failure, unspecified whether with hypoxia or hypercapnia: Secondary | ICD-10-CM | POA: Diagnosis not present

## 2016-09-28 ENCOUNTER — Other Ambulatory Visit: Payer: Self-pay

## 2016-09-28 NOTE — Patient Outreach (Signed)
[facility] Care Management  09/28/2016  Nancy Meyer [birth year] [id number]  Telephone Screen  Referral Date: 09/27/16 Referral Source: [facility] Dept Referral Reason: "recent d/c from SNF as custodial/max level of function, still with ongoing back pain and potential surgery of kyphoplasty in future, h/o back pain, compression fracture, chronic pain, dementia" Insurance: [employer]    Outreach attempt # 1 to patient. No answer and unable to leave voicemail as it has not been set up.      Plan: RN CM will make outreach attempt to patient within three business days.   Antionette Fairyoshanda Tayia Stonesifer, RN,BSN,CCM [facility] Care Management Telephonic Care Management Coordinator Direct Phone: [phone] Toll Free: [phone] Fax: [phone]

## 2016-10-01 ENCOUNTER — Ambulatory Visit (HOSPITAL_COMMUNITY)
Admission: RE | Admit: 2016-10-01 | Discharge: 2016-10-01 | Disposition: A | Discharge status: Home / Self Care | Payer: PPO | Source: Ambulatory Visit | Attending: Interventional Radiology | Admitting: Interventional Radiology

## 2016-10-01 ENCOUNTER — Other Ambulatory Visit: Payer: Self-pay

## 2016-10-01 DIAGNOSIS — S22069A Unspecified fracture of T7-T8 vertebra, initial encounter for closed fracture: Secondary | ICD-10-CM | POA: Diagnosis not present

## 2016-10-01 DIAGNOSIS — M4850XA Collapsed vertebra, not elsewhere classified, site unspecified, initial encounter for fracture: Principal | ICD-10-CM

## 2016-10-01 DIAGNOSIS — R319 Hematuria, unspecified: Secondary | ICD-10-CM | POA: Diagnosis not present

## 2016-10-01 DIAGNOSIS — IMO0001 Reserved for inherently not codable concepts without codable children: Secondary | ICD-10-CM

## 2016-10-01 DIAGNOSIS — N390 Urinary tract infection, site not specified: Secondary | ICD-10-CM | POA: Diagnosis not present

## 2016-10-01 HISTORY — PX: IR RADIOLOGIST EVAL & MGMT: IMG5224

## 2016-10-01 NOTE — Patient Outreach (Signed)
[facility] Care Management  10/01/2016  Darrell JewelBarbara P Corriher [birth year] [id number]   Telephone Screen  Referral Date: 09/27/16 Referral Source: [facility] Dept Referral Reason: "recent d/c from SNF as custodial/max level of function, still with ongoing back pain and potential surgery of kyphoplasty in future, h/o back pain, compression fracture, chronic pain, dementia" Insurance: [employer]TA    Outreach attempt #2 to patient. No answer at present and unable to leave voicemail message.    Plan: RN CM will make outreach attempt to patient within one business day.   Antionette Fairyoshanda Inanna Telford, RN,BSN,CCM [facility] Care Management Telephonic Care Management Coordinator Direct Phone: [phone] Toll Free: [phone] Fax: [phone]

## 2016-10-02 ENCOUNTER — Other Ambulatory Visit: Payer: Self-pay

## 2016-10-02 NOTE — Patient Outreach (Signed)
[facility] Care Management  10/02/2016  Darrell JewelBarbara P Tamashiro [birth year] [id number]   Telephone Screen  Referral Date: 09/27/16 Referral Source: [facility] Dept Referral Reason: "recent d/c from SNF as custodial/max level of function, still with ongoing back pain and potential surgery of kyphoplasty in future, h/o back pain, compression fracture, chronic pain, dementia" Insurance: [employer]TA   Outreach attempt #3 to patient. No answer and unable to leave message.    Plan: RN CM will send unsuccessful outreach letter to patient and close case if no response within 10 business days.    Antionette Fairyoshanda Darya Bigler, RN,BSN,CCM [facility] Care Management Telephonic Care Management Coordinator Direct Phone: [phone] Toll Free: [phone] Fax: [phone]

## 2016-10-05 ENCOUNTER — Encounter (HOSPITAL_COMMUNITY): Payer: Self-pay | Admitting: Interventional Radiology

## 2016-10-11 DIAGNOSIS — D649 Anemia, unspecified: Secondary | ICD-10-CM | POA: Diagnosis not present

## 2016-10-11 DIAGNOSIS — I10 Essential (primary) hypertension: Secondary | ICD-10-CM | POA: Diagnosis not present

## 2016-10-16 ENCOUNTER — Other Ambulatory Visit: Payer: Self-pay

## 2016-10-16 NOTE — Patient Outreach (Signed)
[facility] Care Management  10/16/2016  Nancy JewelBarbara P Meyer Aug 03, 1920 [id number]   Telephone Screen  Referral Date: 09/27/16 Referral Source: [facility] Referral Reason: "recent d/c from SNF as custodial/max level of function, still with ongoing back pain and potential surgery of kyphoplasty in future, h/o back pain, compression fracture, chronic pain, dementia" Insurance: [employer]      Multiple attempts to establish contact with patient without success. No response from letter mailed to patient. Case is being closed at this time.     Plan: RN CM will notify [facility] administrative [facility] of case status.  Antionette Fairyoshanda Winford Hehn, RN,BSN,CCM [facility] Care Management Telephonic Care Management Coordinator Direct Phone: [phone] Toll Free: [phone] Fax: [phone]

## 2016-11-15 DIAGNOSIS — F0390 Unspecified dementia without behavioral disturbance: Secondary | ICD-10-CM | POA: Diagnosis not present

## 2016-11-15 DIAGNOSIS — I10 Essential (primary) hypertension: Secondary | ICD-10-CM | POA: Diagnosis not present

## 2016-11-15 DIAGNOSIS — R2689 Other abnormalities of gait and mobility: Secondary | ICD-10-CM | POA: Diagnosis not present

## 2016-11-15 DIAGNOSIS — F418 Other specified anxiety disorders: Secondary | ICD-10-CM | POA: Diagnosis not present

## 2016-11-15 DIAGNOSIS — M8008XD Age-related osteoporosis with current pathological fracture, vertebra(e), subsequent encounter for fracture with routine healing: Secondary | ICD-10-CM | POA: Diagnosis not present

## 2016-11-15 DIAGNOSIS — M6281 Muscle weakness (generalized): Secondary | ICD-10-CM | POA: Diagnosis not present

## 2016-11-15 DIAGNOSIS — Z87440 Personal history of urinary (tract) infections: Secondary | ICD-10-CM | POA: Diagnosis not present

## 2016-11-15 DIAGNOSIS — Z9181 History of falling: Secondary | ICD-10-CM | POA: Diagnosis not present

## 2016-11-18 DIAGNOSIS — Z79899 Other long term (current) drug therapy: Secondary | ICD-10-CM | POA: Diagnosis not present

## 2016-11-18 DIAGNOSIS — N390 Urinary tract infection, site not specified: Secondary | ICD-10-CM | POA: Diagnosis not present

## 2016-11-18 DIAGNOSIS — R319 Hematuria, unspecified: Secondary | ICD-10-CM | POA: Diagnosis not present

## 2016-11-30 DIAGNOSIS — M79642 Pain in left hand: Secondary | ICD-10-CM | POA: Diagnosis not present

## 2016-12-25 DIAGNOSIS — M545 Low back pain: Secondary | ICD-10-CM | POA: Diagnosis not present

## 2017-01-07 DIAGNOSIS — R05 Cough: Secondary | ICD-10-CM | POA: Diagnosis not present

## 2017-01-15 DIAGNOSIS — J189 Pneumonia, unspecified organism: Secondary | ICD-10-CM | POA: Diagnosis not present

## 2017-01-15 DIAGNOSIS — M810 Age-related osteoporosis without current pathological fracture: Secondary | ICD-10-CM | POA: Diagnosis not present

## 2017-01-15 DIAGNOSIS — S32009A Unspecified fracture of unspecified lumbar vertebra, initial encounter for closed fracture: Secondary | ICD-10-CM | POA: Diagnosis not present

## 2017-01-15 DIAGNOSIS — J449 Chronic obstructive pulmonary disease, unspecified: Secondary | ICD-10-CM | POA: Diagnosis not present

## 2017-02-15 DIAGNOSIS — M810 Age-related osteoporosis without current pathological fracture: Secondary | ICD-10-CM | POA: Diagnosis not present

## 2017-02-15 DIAGNOSIS — J189 Pneumonia, unspecified organism: Secondary | ICD-10-CM | POA: Diagnosis not present

## 2017-02-15 DIAGNOSIS — J449 Chronic obstructive pulmonary disease, unspecified: Secondary | ICD-10-CM | POA: Diagnosis not present

## 2017-02-15 DIAGNOSIS — S32009A Unspecified fracture of unspecified lumbar vertebra, initial encounter for closed fracture: Secondary | ICD-10-CM | POA: Diagnosis not present

## 2017-02-16 DIAGNOSIS — I10 Essential (primary) hypertension: Secondary | ICD-10-CM | POA: Diagnosis not present

## 2017-02-16 DIAGNOSIS — R319 Hematuria, unspecified: Secondary | ICD-10-CM | POA: Diagnosis not present

## 2017-02-16 DIAGNOSIS — N390 Urinary tract infection, site not specified: Secondary | ICD-10-CM | POA: Diagnosis not present

## 2017-03-17 DIAGNOSIS — M810 Age-related osteoporosis without current pathological fracture: Secondary | ICD-10-CM | POA: Diagnosis not present

## 2017-03-17 DIAGNOSIS — J189 Pneumonia, unspecified organism: Secondary | ICD-10-CM | POA: Diagnosis not present

## 2017-03-17 DIAGNOSIS — S32009A Unspecified fracture of unspecified lumbar vertebra, initial encounter for closed fracture: Secondary | ICD-10-CM | POA: Diagnosis not present

## 2017-03-17 DIAGNOSIS — J449 Chronic obstructive pulmonary disease, unspecified: Secondary | ICD-10-CM | POA: Diagnosis not present

## 2017-03-31 ENCOUNTER — Encounter (HOSPITAL_COMMUNITY): Payer: Self-pay | Admitting: Emergency Medicine

## 2017-03-31 ENCOUNTER — Emergency Department (HOSPITAL_COMMUNITY): Payer: PPO

## 2017-03-31 ENCOUNTER — Emergency Department (HOSPITAL_COMMUNITY)
Admission: EM | Admit: 2017-03-31 | Discharge: 2017-03-31 | Disposition: A | Discharge status: Home / Self Care | Payer: PPO | Attending: Emergency Medicine | Admitting: Emergency Medicine

## 2017-03-31 DIAGNOSIS — F419 Anxiety disorder, unspecified: Secondary | ICD-10-CM

## 2017-03-31 DIAGNOSIS — R600 Localized edema: Secondary | ICD-10-CM | POA: Diagnosis not present

## 2017-03-31 DIAGNOSIS — K529 Noninfective gastroenteritis and colitis, unspecified: Secondary | ICD-10-CM

## 2017-03-31 DIAGNOSIS — J810 Acute pulmonary edema: Secondary | ICD-10-CM | POA: Diagnosis not present

## 2017-03-31 DIAGNOSIS — R6883 Chills (without fever): Secondary | ICD-10-CM | POA: Diagnosis present

## 2017-03-31 DIAGNOSIS — I10 Essential (primary) hypertension: Secondary | ICD-10-CM | POA: Diagnosis not present

## 2017-03-31 DIAGNOSIS — Z79899 Other long term (current) drug therapy: Secondary | ICD-10-CM | POA: Diagnosis not present

## 2017-03-31 DIAGNOSIS — M79604 Pain in right leg: Secondary | ICD-10-CM | POA: Diagnosis not present

## 2017-03-31 DIAGNOSIS — F0390 Unspecified dementia without behavioral disturbance: Secondary | ICD-10-CM | POA: Diagnosis not present

## 2017-03-31 DIAGNOSIS — R030 Elevated blood-pressure reading, without diagnosis of hypertension: Secondary | ICD-10-CM | POA: Diagnosis not present

## 2017-03-31 DIAGNOSIS — R109 Unspecified abdominal pain: Secondary | ICD-10-CM | POA: Diagnosis not present

## 2017-03-31 LAB — URINALYSIS, ROUTINE W REFLEX MICROSCOPIC
Bilirubin Urine: NEGATIVE
GLUCOSE, UA: NEGATIVE mg/dL
HGB URINE DIPSTICK: NEGATIVE
Ketones, ur: NEGATIVE mg/dL
NITRITE: NEGATIVE
PH: 6 (ref 5.0–8.0)
Protein, ur: NEGATIVE mg/dL
SPECIFIC GRAVITY, URINE: 1.013 (ref 1.005–1.030)

## 2017-03-31 LAB — COMPREHENSIVE METABOLIC PANEL
ALBUMIN: 3.6 g/dL (ref 3.5–5.0)
ALK PHOS: 73 U/L (ref 38–126)
ALT: 14 U/L (ref 14–54)
ANION GAP: 9 (ref 5–15)
AST: 23 U/L (ref 15–41)
BUN: 20 mg/dL (ref 6–20)
CALCIUM: 9 mg/dL (ref 8.9–10.3)
CO2: 26 mmol/L (ref 22–32)
Chloride: 103 mmol/L (ref 101–111)
Creatinine, Ser: 0.75 mg/dL (ref 0.44–1.00)
GFR calc Af Amer: >60 mL/min (ref >60)
GFR calc non Af Amer: >60 mL/min (ref >60)
Glucose, Bld: 87 mg/dL (ref 65–99)
POTASSIUM: 3.4 mmol/L — AB (ref 3.5–5.1)
SODIUM: 138 mmol/L (ref 135–145)
TOTAL PROTEIN: 6 g/dL — AB (ref 6.5–8.1)
Total Bilirubin: 0.5 mg/dL (ref 0.3–1.2)

## 2017-03-31 LAB — CBC WITH DIFFERENTIAL/PLATELET
BASOS ABS: 0 10*3/uL (ref 0.0–0.1)
BASOS PCT: 0 %
EOS ABS: 0.3 10*3/uL (ref 0.0–0.7)
Eosinophils Relative: 5 %
HCT: 34.4 % — ABNORMAL LOW (ref 36.0–46.0)
Hemoglobin: 10.5 g/dL — ABNORMAL LOW (ref 12.0–15.0)
Lymphocytes Relative: 27 %
Lymphs Abs: 1.5 10*3/uL (ref 0.7–4.0)
MCH: 28.9 pg (ref 26.0–34.0)
MCHC: 30.5 g/dL (ref 30.0–36.0)
MCV: 94.8 fL (ref 78.0–100.0)
MONO ABS: 0.7 10*3/uL (ref 0.1–1.0)
Monocytes Relative: 12 %
NEUTROS PCT: 56 %
Neutro Abs: 3.1 10*3/uL (ref 1.7–7.7)
Platelets: 285 10*3/uL (ref 150–400)
RBC: 3.63 MIL/uL — ABNORMAL LOW (ref 3.87–5.11)
RDW: 14.4 % (ref 11.5–15.5)
WBC: 5.7 10*3/uL (ref 4.0–10.5)

## 2017-03-31 LAB — I-STAT CG4 LACTIC ACID, ED
LACTIC ACID, VENOUS: 1.52 mmol/L (ref 0.5–1.9)
Lactic Acid, Venous: 2.55 mmol/L (ref 0.5–1.9)

## 2017-03-31 LAB — LIPASE, BLOOD: LIPASE: 36 U/L (ref 11–51)

## 2017-03-31 MED ORDER — IOPAMIDOL (ISOVUE-300) INJECTION 61%
INTRAVENOUS | Status: AC
  Filled 2017-03-31: qty 30

## 2017-03-31 MED ORDER — SODIUM CHLORIDE 0.9 % IV BOLUS (SEPSIS)
1000.0000 mL | Freq: Once | INTRAVENOUS | Status: AC
  Administered 2017-03-31: 1000 mL via INTRAVENOUS

## 2017-03-31 MED ORDER — METRONIDAZOLE 500 MG PO TABS: 500.0000 mg | ORAL_TABLET | Freq: Three times a day (TID) | ORAL | 0 refills | Status: DC

## 2017-03-31 MED ORDER — CIPROFLOXACIN HCL 500 MG PO TABS: 500.0000 mg | ORAL_TABLET | Freq: Two times a day (BID) | ORAL | 0 refills | Status: DC

## 2017-03-31 MED ORDER — FUROSEMIDE 10 MG/ML IJ SOLN
20.0000 mg | Freq: Once | INTRAMUSCULAR | Status: AC
  Administered 2017-03-31: 20 mg via INTRAVENOUS
  Filled 2017-03-31: qty 2

## 2017-03-31 MED ORDER — POTASSIUM CHLORIDE CRYS ER 20 MEQ PO TBCR
40.0000 meq | EXTENDED_RELEASE_TABLET | Freq: Once | ORAL | Status: AC
Start: 2017-03-31 — End: 2017-03-31
  Administered 2017-03-31: 40 meq via ORAL
  Filled 2017-03-31: qty 2

## 2017-03-31 MED ORDER — FUROSEMIDE 20 MG PO TABS: 20.0000 mg | ORAL_TABLET | Freq: Every day | ORAL | 0 refills | Status: AC

## 2017-03-31 MED ORDER — METRONIDAZOLE 500 MG PO TABS
500.0000 mg | ORAL_TABLET | Freq: Once | ORAL | Status: AC
  Administered 2017-03-31: 500 mg via ORAL
  Filled 2017-03-31: qty 1

## 2017-03-31 MED ORDER — CIPROFLOXACIN HCL 500 MG PO TABS
500.0000 mg | ORAL_TABLET | Freq: Once | ORAL | Status: AC
  Administered 2017-03-31: 500 mg via ORAL
  Filled 2017-03-31: qty 1

## 2017-03-31 MED ORDER — IOPAMIDOL (ISOVUE-300) INJECTION 61%
INTRAVENOUS | Status: AC
  Administered 2017-03-31: 75 mL
  Filled 2017-03-31: qty 75

## 2017-03-31 NOTE — ED Notes (Signed)
Pts daughter will be returning [facility]

## 2017-03-31 NOTE — ED Provider Notes (Signed)
[facility] EMERGENCY DEPARTMENT Provider Note   CSN: [id number] Arrival date & time: 03/31/17  0117     History   Chief Complaint Chief Complaint  Patient presents with  . Hypertension    HPI Nancy Meyer is a 84 y.o. female.  The history is provided by the patient.  She has history of hypertension, hyperlipidemia, anxiety, dementia and comes in complaining of chills and unable to get warm tonight.  She denies fever or sweats.  There is been no sore throat or cough.  There is been no nausea, vomiting, diarrhea.  She has noted urinary frequency over the last 2hours.  There is no urinary urgency or tenesmus and no dysuria.  Past Medical History:  Diagnosis Date  . Anxiety and depression   . Arthritis   . Chronic pain   . Compression fracture of C-spine (HCC)   . Dementia   . Hyperlipidemia   . Hypertension   . Macular degeneration   . Osteoporosis     Patient Active Problem List   Diagnosis Date Noted  . Traumatic compression fracture of T7 thoracic vertebra (HCC) 08/29/2016  . Traumatic compression fracture of T11 thoracic vertebra (HCC) 08/29/2016  . Compression fracture of body of thoracic vertebra (HCC)   . Generalized abdominal pain   . Malnutrition of moderate degree 08/28/2016  . Acute respiratory failure with hypoxia (HCC) 08/27/2016  . Acute urinary retention 08/27/2016  . Rib fractures 01/17/2014  . Acute encephalopathy 01/17/2014  . Protein-calorie malnutrition, severe (HCC) 12/15/2013  . Compression fracture of L1 lumbar vertebra (HCC) 12/15/2013  . Hypokalemia 12/15/2013  . Constipation 12/14/2013  . Nausea alone 12/04/2013  . Depression 11/06/2013  . Unspecified constipation 11/06/2013  . Compression fracture of L4 lumbar vertebra (HCC) 11/05/2013  . Fracture of vertebra 11/03/2013  . Inguinal hernia, right. 08/09/2011  . Hyperglycemia 03/01/2011  . Seasonal allergies 03/01/2011  . Hyponatremia 02/28/2011  . Normocytic  anemia 02/28/2011  . HTN (hypertension) 02/27/2011  . Diskitis 02/27/2011  . Cataract 02/27/2011  . Hyperlipidemia 02/27/2011  . Osteoporosis     Past Surgical History:  Procedure Laterality Date  . APPENDECTOMY    . BACK SURGERY     kyphoplasty  . EYE SURGERY    . IR RADIOLOGIST EVAL & MGMT  10/01/2016    OB History    Gravida Para Term Preterm AB Living   3 3 3          SAB TAB Ectopic Multiple Live Births                   Home Medications    Prior to Admission medications   Medication Sig Start Date End Date Taking? Authorizing Provider  acetaminophen (TYLENOL) 650 MG CR tablet Take 650-1,300 mg by mouth every 8 (eight) hours as needed for pain.     [provider]  buPROPion (WELLBUTRIN SR) 150 MG 12 hr tablet Take 150 mg by mouth 2 (two) times daily.     [provider]  calcium citrate-vitamin D (CITRACAL+D) 315-200 MG-UNIT tablet Take 1 tablet by mouth daily with breakfast.    [provider]  cholecalciferol (VITAMIN D) 1000 units tablet Take 1,000 Units by mouth daily with breakfast.    [provider]  diazepam (VALIUM) 2 MG tablet Take 1 mg by mouth at bedtime.     [provider]  docusate sodium (COLACE) 100 MG capsule Take 200 mg by mouth 2 (two) times daily.  [provider]  donepezil (ARICEPT) 10 MG tablet Take 10 mg by mouth at bedtime.     [provider]  famotidine (PEPCID) 20 MG tablet Take 1 tablet (20 mg total) by mouth daily. 09/01/16   Rolly Salter, MD  feeding supplement (BOOST / RESOURCE BREEZE) LIQD Take 1 Container by mouth 3 (three) times daily between meals. 08/31/16   Rolly Salter, MD  loratadine (CLARITIN) 10 MG tablet Take 10 mg by mouth daily with supper.    [provider]  losartan (COZAAR) 100 MG tablet Take 100 mg by mouth daily with breakfast.  07/12/11   [provider]  Melatonin 3 MG TABS Take 3 mg by mouth at bedtime.     [provider]    Multiple Vitamins-Minerals (OCUVITE PRESERVISION PO) Take 1 tablet by mouth 2 (two) times daily.    [provider]  ondansetron (ZOFRAN) 4 MG tablet Take 4 mg by mouth every 8 (eight) hours as needed for nausea or vomiting.    [provider]  polyethylene glycol (CLEARLAX) packet Take 17 g by mouth daily as needed for mild constipation.    [provider]  pregabalin (LYRICA) 25 MG capsule Take 25-50 mg by mouth 3 (three) times daily. Take 25 mg in the morning and at lunch, then 50 mg at bedtime    [provider]  psyllium (METAMUCIL) 58.6 % packet Take 1 packet by mouth daily with breakfast.    [provider]  Simethicone 125 MG TABS Take 1 tablet by mouth every 6 (six) hours as needed (for gas).    [provider]  traMADol (ULTRAM) 50 MG tablet Take 0.5 tablets (25 mg total) by mouth every 6 (six) hours as needed for severe pain. 08/31/16   Rolly Salter, MD    Family History Family History  Problem Relation Age of Onset  . Cancer Daughter        breast    Social History Social History   Tobacco Use  . Smoking status: Never Smoker  . Smokeless tobacco: Never Used  Substance Use Topics  . Alcohol use: No  . Drug use: No     Allergies   Macrobid [nitrofurantoin]   Review of Systems Review of Systems  All other systems reviewed and are negative.    Physical Exam Updated Vital Signs BP 133/82 (BP Location: Left Arm)   Pulse 69   Temp (!) 97.3 F (36.3 C) (Rectal)   Resp 18   SpO2 97%   Physical Exam  Nursing note and vitals reviewed.  84 year old female, resting comfortably and in no acute distress. Vital signs are normal. Oxygen saturation is 97%, which is normal. Head is normocephalic and atraumatic. PERRLA, EOMI. Oropharynx is clear. Neck is nontender and supple without adenopathy or JVD. Back is nontender in the midline.  There is mild to moderate right CVA tenderness. Lungs are clear without rales,  wheezes, or rhonchi. Chest is nontender. Heart has regular rate and rhythm without murmur. Abdomen is soft, flat, with mild to moderate tenderness in the right mid and lower abdomen.  There is no rebound or guarding.  There are no masses or hepatosplenomegaly and peristalsis is hypoactive. Extremities have 2+ pedal edema, full range of motion is present. Skin is warm and dry without rash. Neurologic: Mental status is normal, cranial nerves are intact, there are no motor or sensory deficits.  ED Treatments / Results  Labs (all labs ordered are  listed, but only abnormal results are displayed) Labs Reviewed  COMPREHENSIVE METABOLIC PANEL - Abnormal; Notable for the following components:      Result Value   Potassium 3.4 (*)    Total Protein 6.0 (*)    All other components within normal limits  CBC WITH DIFFERENTIAL/PLATELET - Abnormal; Notable for the following components:   RBC 3.63 (*)    Hemoglobin 10.5 (*)    HCT 34.4 (*)    All other components within normal limits  URINALYSIS, ROUTINE W REFLEX MICROSCOPIC - Abnormal; Notable for the following components:   APPearance HAZY (*)    Leukocytes, UA TRACE (*)    Bacteria, UA RARE (*)    Squamous Epithelial / LPF 0-5 (*)    All other components within normal limits  I-STAT CG4 LACTIC ACID, ED - Abnormal; Notable for the following components:   Lactic Acid, Venous 2.55 (*)    All other components within normal limits  CULTURE, BLOOD (ROUTINE X 2)  CULTURE, BLOOD (ROUTINE X 2)  LIPASE, BLOOD  I-STAT CG4 LACTIC ACID, ED    Radiology Ct Abdomen Pelvis W Contrast  Result Date: 03/31/2017 CLINICAL DATA:  84 y/o  F; abdominal pain. EXAM: CT ABDOMEN AND PELVIS WITH CONTRAST TECHNIQUE: Multidetector CT imaging of the abdomen and pelvis was performed using the standard protocol following bolus administration of intravenous contrast. CONTRAST:  75mL ISOVUE-300 IOPAMIDOL (ISOVUE-300) INJECTION 61% COMPARISON:  08/27/2016 CT abdomen and pelvis.  FINDINGS: Lower chest: Right lower lobe platelike atelectasis. Moderate hiatal hernia. Hepatobiliary: No focal liver abnormality is seen. No gallstones, gallbladder wall thickening, or biliary dilatation. Pancreas: Unremarkable. No pancreatic ductal dilatation or surrounding inflammatory changes. Spleen: Normal in size without focal abnormality. Adrenals/Urinary Tract: Stable mild enlargement of the left adrenal gland. Normal right adrenal gland. Stable subcentimeter cortical lucencies in kidneys bilaterally likely representing cysts. Right extrarenal pelvis. No hydronephrosis. Mild distention of the bladder. Dependent high attenuation foci within the bladder may represent small bladder stones at the have moved in comparison with the prior CT of abdomen and pelvis. Stomach/Bowel: Stomach is within normal limits. Extensive sigmoid diverticulosis. Mild diffuse colon wall thickening best appreciated in the ascending and sigmoid segments of colon. No inflammatory or obstructive changes of small bowel. Vascular/Lymphatic: Aortic atherosclerosis. No enlarged abdominal or pelvic lymph nodes. Reproductive: Uterus and bilateral adnexa are unremarkable. Other: No abdominal wall hernia or abnormality. No abdominopelvic ascites. Musculoskeletal: T12, L1, and L4 kyphoplasty. T11, T12, L1, L3, L4 compression deformities are stable. Chronic right-sided rib fractures. Stable chronic left parasymphyseal pelvic fracture involving inferior and superior pubic rami. IMPRESSION: 1. Mild diffuse colon wall thickening best appreciated in the ascending and sigmoid segments probably representing mild colitis. Moderate volume of stool in the colon. 2. Moderate hiatal hernia. 3. Stable tiny bladder stones. 4. Extensive sigmoid diverticulosis. 5. Aortic atherosclerosis. 6. Multiple stable chronic compression deformities of thoracolumbar spine and pelvis. Electronically Signed   By: Mitzi HansenLance  Furusawa-Stratton M.D.   On: 03/31/2017 04:54   Dg  Chest Port 1 View  Result Date: 03/31/2017 CLINICAL DATA:  Chills. Hypertensive with bilateral pedal edema. Aches all over starting today. History of hypertension. Nonsmoker. EXAM: PORTABLE CHEST 1 VIEW COMPARISON:  08/27/2016 FINDINGS: Shallow inspiration. Normal heart size and pulmonary vascularity. No blunting of costophrenic angles. Probable small amount of fluid in the right minor fissure. No edema or consolidation. Calcification of the aorta. No pneumothorax. Degenerative changes in the thoracic spine. Multiple vertebral compression fractures, some with kyphoplasty change. IMPRESSION: Shallow inspiration.  Fluid in the minor fissure. No edema or consolidation. Aortic atherosclerosis. Electronically Signed   By: Burman Nieves M.D.   On: 03/31/2017 02:04    Procedures Procedures (including critical care time)  Medications Ordered in ED Medications  iopamidol (ISOVUE-300) 61 % injection (not administered)  ciprofloxacin (CIPRO) tablet 500 mg (not administered)  metroNIDAZOLE (FLAGYL) tablet 500 mg (not administered)  furosemide (LASIX) injection 20 mg (not administered)  potassium chloride SA (K-DUR,KLOR-CON) CR tablet 40 mEq (not administered)  sodium chloride 0.9 % bolus 1,000 mL (0 mLs Intravenous Stopped 03/31/17 0406)  iopamidol (ISOVUE-300) 61 % injection (75 mLs  Contrast Given 03/31/17 0418)     Initial Impression / Assessment and Plan / ED Course  I have reviewed the triage vital signs and the nursing notes.  Pertinent labs & imaging results that were available during my care of the patient were reviewed by me and considered in my medical decision making (see chart for details).  Chills with urinary symptoms and right-sided abdominal pain and CVA tenderness.  This is certainly suspicious for urinary tract infection and pyelonephritis.  Consider diverticulitis, appendicitis, pancreatitis, viral illness.  Old records are reviewed, and she had been admitted to the hospital in June 2018  for generalized abdominal pain with essentially negative CT scan at that time.  Although she is afebrile, with chills will start sepsis workup.  Will send for CT of abdomen and pelvis to look for pathology other than urinary tract infection.  Urine shows no sign of infection.  Mild anemia is present and unchanged from baseline.  WBC is normal with normal differential.  Lactic acid is mildly elevated, but this is not felt to be due from sepsis.  Abdominal CT scan shows probable evidence of colitis and this probably accounts for her abdominal tenderness and flank tenderness.  Her son has arrived and evidently her foot swelling is new and that was matter of concern.  She has normal renal function.  No chest x-ray findings to suggest heart failure.  She is discharged with prescriptions for ciprofloxacin and metronidazole for her colitis, and given a prescription for a 5-day course of furosemide.  Evidently, she also has problems with needing to go to the bathroom frequently.  CT does show a somewhat distended bladder and I wonder if she is not emptying her bladder sufficiently.  I have recommended follow-up with urology to evaluate this.  Also, going forward, use daily weights to help with deciding whether additional furosemide is needed after the 5-day course prescribed.  Return precautions discussed.  Final Clinical Impressions(s) / ED Diagnoses   Final diagnoses:  Colitis  Pedal edema  Anxiety    ED Discharge Orders        Ordered    ciprofloxacin (CIPRO) 500 MG tablet  2 times daily     03/31/17 0518    metroNIDAZOLE (FLAGYL) 500 MG tablet  3 times daily     03/31/17 0518    furosemide (LASIX) 20 MG tablet  Daily     03/31/17 0518       Dione Booze, MD 03/31/17 [phone]

## 2017-03-31 NOTE — ED Notes (Signed)
Nurse collect labs

## 2017-03-31 NOTE — ED Notes (Signed)
Report called to [facility], pts daughter transporting pt back to facility

## 2017-03-31 NOTE — ED Triage Notes (Signed)
Per [facility], Pt from [facility]. Staff called due to pt hypertensive- 180/100 and bilateral pedal edema, +3 on the R foot, +2 on L foot. Pt complaining of aches all over starting today. Pt alert and oriented x3.

## 2017-03-31 NOTE — Discharge Instructions (Signed)
Return if you are having any problems.  Once the swelling in your feet has gone down, weigh yourself every day. If your weight goes up more than 2 pounds in a day, or 5 pounds in a week, then resume taking furosemide until your weight has gone back down. You will need to get the furosemide prescription through your primary care provider.

## 2017-04-03 DIAGNOSIS — R350 Frequency of micturition: Secondary | ICD-10-CM | POA: Diagnosis not present

## 2017-04-03 DIAGNOSIS — D649 Anemia, unspecified: Secondary | ICD-10-CM | POA: Diagnosis not present

## 2017-04-05 LAB — CULTURE, BLOOD (ROUTINE X 2)
CULTURE: NO GROWTH
Culture: NO GROWTH
SPECIAL REQUESTS: ADEQUATE
Special Requests: ADEQUATE

## 2017-04-09 DIAGNOSIS — Z79899 Other long term (current) drug therapy: Secondary | ICD-10-CM | POA: Diagnosis not present

## 2017-04-09 DIAGNOSIS — D649 Anemia, unspecified: Secondary | ICD-10-CM | POA: Diagnosis not present

## 2017-04-11 DIAGNOSIS — R2689 Other abnormalities of gait and mobility: Secondary | ICD-10-CM | POA: Diagnosis not present

## 2017-04-11 DIAGNOSIS — Z9181 History of falling: Secondary | ICD-10-CM | POA: Diagnosis not present

## 2017-04-11 DIAGNOSIS — R229 Localized swelling, mass and lump, unspecified: Secondary | ICD-10-CM | POA: Diagnosis not present

## 2017-04-17 DIAGNOSIS — R229 Localized swelling, mass and lump, unspecified: Secondary | ICD-10-CM | POA: Diagnosis not present

## 2017-04-17 DIAGNOSIS — Z9181 History of falling: Secondary | ICD-10-CM | POA: Diagnosis not present

## 2017-04-17 DIAGNOSIS — R2689 Other abnormalities of gait and mobility: Secondary | ICD-10-CM | POA: Diagnosis not present

## 2017-04-17 DIAGNOSIS — J189 Pneumonia, unspecified organism: Secondary | ICD-10-CM | POA: Diagnosis not present

## 2017-04-17 DIAGNOSIS — J449 Chronic obstructive pulmonary disease, unspecified: Secondary | ICD-10-CM | POA: Diagnosis not present

## 2017-04-17 DIAGNOSIS — S32009A Unspecified fracture of unspecified lumbar vertebra, initial encounter for closed fracture: Secondary | ICD-10-CM | POA: Diagnosis not present

## 2017-04-17 DIAGNOSIS — M810 Age-related osteoporosis without current pathological fracture: Secondary | ICD-10-CM | POA: Diagnosis not present

## 2017-04-22 DIAGNOSIS — Z9181 History of falling: Secondary | ICD-10-CM | POA: Diagnosis not present

## 2017-04-22 DIAGNOSIS — R229 Localized swelling, mass and lump, unspecified: Secondary | ICD-10-CM | POA: Diagnosis not present

## 2017-04-22 DIAGNOSIS — R2689 Other abnormalities of gait and mobility: Secondary | ICD-10-CM | POA: Diagnosis not present

## 2017-04-30 DIAGNOSIS — R2689 Other abnormalities of gait and mobility: Secondary | ICD-10-CM | POA: Diagnosis not present

## 2017-04-30 DIAGNOSIS — R229 Localized swelling, mass and lump, unspecified: Secondary | ICD-10-CM | POA: Diagnosis not present

## 2017-04-30 DIAGNOSIS — Z9181 History of falling: Secondary | ICD-10-CM | POA: Diagnosis not present

## 2017-05-02 DIAGNOSIS — N390 Urinary tract infection, site not specified: Secondary | ICD-10-CM | POA: Diagnosis not present

## 2017-05-02 DIAGNOSIS — I10 Essential (primary) hypertension: Secondary | ICD-10-CM | POA: Diagnosis not present

## 2017-05-02 DIAGNOSIS — D649 Anemia, unspecified: Secondary | ICD-10-CM | POA: Diagnosis not present

## 2017-05-02 DIAGNOSIS — Z79899 Other long term (current) drug therapy: Secondary | ICD-10-CM | POA: Diagnosis not present

## 2017-05-07 DIAGNOSIS — R229 Localized swelling, mass and lump, unspecified: Secondary | ICD-10-CM | POA: Diagnosis not present

## 2017-05-07 DIAGNOSIS — Z9181 History of falling: Secondary | ICD-10-CM | POA: Diagnosis not present

## 2017-05-07 DIAGNOSIS — R2689 Other abnormalities of gait and mobility: Secondary | ICD-10-CM | POA: Diagnosis not present

## 2017-05-18 DIAGNOSIS — J449 Chronic obstructive pulmonary disease, unspecified: Secondary | ICD-10-CM | POA: Diagnosis not present

## 2017-05-18 DIAGNOSIS — M810 Age-related osteoporosis without current pathological fracture: Secondary | ICD-10-CM | POA: Diagnosis not present

## 2017-05-18 DIAGNOSIS — J189 Pneumonia, unspecified organism: Secondary | ICD-10-CM | POA: Diagnosis not present

## 2017-05-18 DIAGNOSIS — S32009A Unspecified fracture of unspecified lumbar vertebra, initial encounter for closed fracture: Secondary | ICD-10-CM | POA: Diagnosis not present

## 2017-06-04 ENCOUNTER — Emergency Department (HOSPITAL_COMMUNITY): Payer: PPO

## 2017-06-04 ENCOUNTER — Other Ambulatory Visit: Payer: Self-pay

## 2017-06-04 ENCOUNTER — Encounter (HOSPITAL_COMMUNITY): Payer: Self-pay

## 2017-06-04 ENCOUNTER — Emergency Department (HOSPITAL_COMMUNITY)
Admission: EM | Admit: 2017-06-04 | Discharge: 2017-06-04 | Disposition: A | Discharge status: Home / Self Care | Payer: PPO | Attending: Emergency Medicine | Admitting: Emergency Medicine

## 2017-06-04 DIAGNOSIS — I10 Essential (primary) hypertension: Secondary | ICD-10-CM | POA: Diagnosis not present

## 2017-06-04 DIAGNOSIS — W050XXA Fall from non-moving wheelchair, initial encounter: Secondary | ICD-10-CM | POA: Insufficient documentation

## 2017-06-04 DIAGNOSIS — Y939 Activity, unspecified: Secondary | ICD-10-CM | POA: Insufficient documentation

## 2017-06-04 DIAGNOSIS — Z79899 Other long term (current) drug therapy: Secondary | ICD-10-CM | POA: Diagnosis not present

## 2017-06-04 DIAGNOSIS — Y999 Unspecified external cause status: Secondary | ICD-10-CM | POA: Diagnosis not present

## 2017-06-04 DIAGNOSIS — S0990XA Unspecified injury of head, initial encounter: Secondary | ICD-10-CM | POA: Diagnosis not present

## 2017-06-04 DIAGNOSIS — S199XXA Unspecified injury of neck, initial encounter: Secondary | ICD-10-CM | POA: Diagnosis not present

## 2017-06-04 DIAGNOSIS — W19XXXA Unspecified fall, initial encounter: Secondary | ICD-10-CM

## 2017-06-04 DIAGNOSIS — R51 Headache: Secondary | ICD-10-CM | POA: Diagnosis not present

## 2017-06-04 DIAGNOSIS — Y92002 Bathroom of unspecified non-institutional (private) residence single-family (private) house as the place of occurrence of the external cause: Secondary | ICD-10-CM | POA: Diagnosis not present

## 2017-06-04 DIAGNOSIS — M542 Cervicalgia: Secondary | ICD-10-CM | POA: Diagnosis not present

## 2017-06-04 NOTE — ED Notes (Signed)
Patient's daughter asking if she can take patient to get something to eat while they wait.  Advised family member that patient needs to remain in lobby and have nothing to eat or drink until after CT is completed and results are reviewed by MD.  Patient is not diabetic,  Daughter voiced understanding and states she will wait.

## 2017-06-04 NOTE — ED Notes (Signed)
E-signature pad not available in hallway; Family was give d/c instructions and verbalized understanding.

## 2017-06-04 NOTE — ED Notes (Signed)
To CT

## 2017-06-04 NOTE — Discharge Instructions (Signed)
Continue Tylenol as needed for pain. Follow up with family doctor as needed.

## 2017-06-04 NOTE — ED Triage Notes (Signed)
Pt from [facility] with family after unwitnessed fall that occurred in the bathroom. Pt not on blood thinners. Pt c/o head pain and L hip pain. Per family, pt c/o chronic L hip pain. Denies neck pain. Pt A&Ox3, alert to month but not year. Family reports no changes since fall. Movement and sensation of BLE intact. Pt able to bear weight with 2+ assist. Pt does not remember fall.

## 2017-06-04 NOTE — ED Provider Notes (Signed)
Patient placed in Quick Look pathway, seen and evaluated   Chief Complaint: fall   HPI:   Pt brought in by family, pt with hx of arthtritis, weakness, gait problems, states was getting out of the wheelchair and fell. Pt reports hitting her head. Initially complaint of headache and wrist pain. Acting normally since then. No LOC. No n/v. No increased back pain. No other complaints. Family at bedside. States pt has very difficult time walking and standing, chronic.   ROS: positive for headache, arthralgia. Negative for dizziness, light headiness, cp, sob.   Physical Exam:   Gen: No distress  Neuro: Awake and Alert  Skin: Warm    Focused Exam: no obvious hematoma or scalp deformity. ttp over top of the head. No midline cervical, thoracic, lumbar spine tenderness. FUll ROM of bilateral wrist, elbow, shoulder, hip, knee. Pt ambulated several steps with some assistance with no pain.   Patient in the emergency department after a fall.  Mainly complaining of a headache.  She does complain of pain to her hips and back, however her family state it is chronic.  She has no tenderness on exam over her spine or hips.  I got her up and walked her myself, she is able to take a few steps with assistance and states it does not hurt to walk.  I do not think she needs any further x-rays at this time.  CT head and cervical spine ordered.  Vitals:   06/04/17 1717 06/04/17 1718  BP:  (!) 161/90  Pulse:  75  Resp:  16  Temp:  98.3 F (36.8 C)  TempSrc:  Oral  SpO2:  98%  Weight: 40.8 kg (90 lb)   Height: 4\' 11"  (1.499 m)       Initiation of care has begun. The patient has been counseled on the process, plan, and necessity for staying for the completion/evaluation, and the remainder of the medical screening examination    Jaynie CrumbleKirichenko, Gwyn Hieronymus, PA-C 06/04/17 1731    Tegeler, Canary Brimhristopher J, MD 06/05/17 [phone]

## 2017-06-04 NOTE — ED Provider Notes (Signed)
[facility] EMERGENCY DEPARTMENT Provider Note   CSN: [id number] Arrival date & time: 06/04/17  1708     History   Chief Complaint Chief Complaint  Patient presents with  . Fall  . Head Injury    HPI Nancy Meyer is a 84 y.o. female.  HPI Nancy Meyer is a 84 y.o. female to emergency department after a fall.  Patient was getting out of her wheelchair in the bathroom when the wheelchair rolled back and she fell forward striking her head on the ground.  No loss of consciousness.  She reports a pain in her head and wrist initially which now improved.  Patient is coming from facility was sent here for further evaluation.  Patient does have history of chronic back pain and hip pain but states that her pain in her hip and back is not worse than before the fall.  No treatment prior to coming  Past Medical History:  Diagnosis Date  . Anxiety and depression   . Arthritis   . Chronic pain   . Compression fracture of C-spine (HCC)   . Dementia   . Hyperlipidemia   . Hypertension   . Macular degeneration   . Osteoporosis     Patient Active Problem List   Diagnosis Date Noted  . Traumatic compression fracture of T7 thoracic vertebra (HCC) 08/29/2016  . Traumatic compression fracture of T11 thoracic vertebra (HCC) 08/29/2016  . Compression fracture of body of thoracic vertebra (HCC)   . Generalized abdominal pain   . Malnutrition of moderate degree 08/28/2016  . Acute respiratory failure with hypoxia (HCC) 08/27/2016  . Acute urinary retention 08/27/2016  . Rib fractures 01/17/2014  . Acute encephalopathy 01/17/2014  . Protein-calorie malnutrition, severe (HCC) 12/15/2013  . Compression fracture of L1 lumbar vertebra (HCC) 12/15/2013  . Hypokalemia 12/15/2013  . Constipation 12/14/2013  . Nausea alone 12/04/2013  . Depression 11/06/2013  . Unspecified constipation 11/06/2013  . Compression fracture of L4 lumbar vertebra (HCC) 11/05/2013  .  Fracture of vertebra 11/03/2013  . Inguinal hernia, right. 08/09/2011  . Hyperglycemia 03/01/2011  . Seasonal allergies 03/01/2011  . Hyponatremia 02/28/2011  . Normocytic anemia 02/28/2011  . HTN (hypertension) 02/27/2011  . Diskitis 02/27/2011  . Cataract 02/27/2011  . Hyperlipidemia 02/27/2011  . Osteoporosis     Past Surgical History:  Procedure Laterality Date  . APPENDECTOMY    . BACK SURGERY     kyphoplasty  . EYE SURGERY    . IR RADIOLOGIST EVAL & MGMT  10/01/2016    OB History    Gravida Para Term Preterm AB Living   3 3 3          SAB TAB Ectopic Multiple Live Births                   Home Medications    Prior to Admission medications   Medication Sig Start Date End Date Taking? Authorizing Provider  acetaminophen (TYLENOL) 650 MG CR tablet Take 650-1,300 mg by mouth every 8 (eight) hours as needed for pain.     [provider]  buPROPion (WELLBUTRIN SR) 150 MG 12 hr tablet Take 150 mg by mouth 2 (two) times daily.     [provider]  calcium citrate-vitamin D (CITRACAL+D) 315-200 MG-UNIT tablet Take 1 tablet by mouth daily with breakfast.    [provider]  cholecalciferol (VITAMIN D) 1000 units tablet Take 1,000 Units by mouth daily with breakfast.    [provider]  ciprofloxacin (CIPRO) 500 MG tablet Take 1 tablet (500 mg total) by mouth 2 (two) times daily. 03/31/17   Dione Booze, MD  diazepam (VALIUM) 2 MG tablet Take 1 mg by mouth at bedtime.     [provider]  docusate sodium (COLACE) 100 MG capsule Take 200 mg by mouth 2 (two) times daily.     [provider]  donepezil (ARICEPT) 10 MG tablet Take 10 mg by mouth at bedtime.     [provider]  famotidine (PEPCID) 20 MG tablet Take 1 tablet (20 mg total) by mouth daily. 09/01/16   Rolly Salter, MD  feeding supplement (BOOST / RESOURCE BREEZE) LIQD Take 1 Container by mouth 3 (three) times daily between meals. 08/31/16   Rolly Salter, MD    furosemide (LASIX) 20 MG tablet Take 1 tablet (20 mg total) by mouth daily. 03/31/17   Dione Booze, MD  loratadine (CLARITIN) 10 MG tablet Take 10 mg by mouth daily with supper.    [provider]  losartan (COZAAR) 100 MG tablet Take 100 mg by mouth daily with breakfast.  07/12/11   [provider]  Melatonin 3 MG TABS Take 3 mg by mouth at bedtime.     [provider]  metroNIDAZOLE (FLAGYL) 500 MG tablet Take 1 tablet (500 mg total) by mouth 3 (three) times daily. 03/31/17   Dione Booze, MD  Multiple Vitamins-Minerals (OCUVITE PRESERVISION PO) Take 1 tablet by mouth 2 (two) times daily.    [provider]  ondansetron (ZOFRAN) 4 MG tablet Take 4 mg by mouth every 8 (eight) hours as needed for nausea or vomiting.    [provider]  polyethylene glycol (CLEARLAX) packet Take 17 g by mouth daily as needed for mild constipation.    [provider]  pregabalin (LYRICA) 25 MG capsule Take 25-50 mg by mouth 3 (three) times daily. Take 25 mg in the morning and at lunch, then 50 mg at bedtime    [provider]  psyllium (METAMUCIL) 58.6 % packet Take 1 packet by mouth daily with breakfast.    [provider]  Simethicone 125 MG TABS Take 1 tablet by mouth every 6 (six) hours as needed (for gas).    [provider]  traMADol (ULTRAM) 50 MG tablet Take 0.5 tablets (25 mg total) by mouth every 6 (six) hours as needed for severe pain. 08/31/16   Rolly Salter, MD    Family History Family History  Problem Relation Age of Onset  . Cancer Daughter        breast    Social History Social History   Tobacco Use  . Smoking status: Never Smoker  . Smokeless tobacco: Never Used  Substance Use Topics  . Alcohol use: No  . Drug use: No     Allergies   Macrobid [nitrofurantoin]   Review of Systems Review of Systems  Constitutional: Negative for chills and fever.  Respiratory: Negative for cough, chest tightness and  shortness of breath.   Cardiovascular: Negative for chest pain, palpitations and leg swelling.  Gastrointestinal: Negative for abdominal pain, diarrhea, nausea and vomiting.  Genitourinary: Negative for dysuria, flank pain, pelvic pain, vaginal bleeding, vaginal discharge and vaginal pain.  Musculoskeletal: Negative for arthralgias, myalgias, neck pain and neck stiffness.  Skin: Negative for rash.  Neurological: Positive for headaches. Negative for dizziness and weakness.  All other systems reviewed and are negative.    Physical Exam Updated Vital Signs BP Marland Kitchen)  161/90 (BP Location: Right Arm)   Pulse 75   Temp 98.3 F (36.8 C) (Oral)   Resp 16   Ht 4\' 11"  (1.499 m)   Wt 40.8 kg (90 lb)   SpO2 98%   BMI 18.18 kg/m    Physical Exam  Constitutional: She is oriented to person, place, and time. She appears well-developed and well-nourished. No distress.  HENT:  Head: Normocephalic.  Eyes: Conjunctivae and EOM are normal. Pupils are equal, round, and reactive to light.  Neck: Normal range of motion. Neck supple.  Minimal midline tenderness.  Cardiovascular: Normal rate, regular rhythm and normal heart sounds.  Pulmonary/Chest: Effort normal and breath sounds normal. No respiratory distress. She has no wheezes. She has no rales.  Abdominal: Soft. Bowel sounds are normal. She exhibits no distension. There is no tenderness. There is no rebound.  Musculoskeletal: She exhibits no edema.  Full range of motion of bilateral wrists, elbows, shoulders, knees, ankles, hips.  No obvious joint swelling or effusion noted.  Able to ambulate with assistance.  No midline thoracic or lumbar spine tenderness.  Neurological: She is alert and oriented to person, place, and time.  Skin: Skin is warm and dry.  Psychiatric: She has a normal Meyer and affect. Her behavior is normal.  Nursing note and vitals reviewed.    ED Treatments / Results  Labs (all labs ordered are listed, but only abnormal results  are displayed) Labs Reviewed - No data to display  EKG  EKG Interpretation None       Radiology Ct Head Wo Contrast  Result Date: 06/04/2017 CLINICAL DATA:  Recent fall with head injury and neck pain, initial encounter EXAM: CT HEAD WITHOUT CONTRAST CT CERVICAL SPINE WITHOUT CONTRAST TECHNIQUE: Multidetector CT imaging of the head and cervical spine was performed following the standard protocol without intravenous contrast. Multiplanar CT image reconstructions of the cervical spine were also generated. COMPARISON:  01/17/2014 FINDINGS: CT HEAD FINDINGS Brain: Atrophic and chronic white matter ischemic changes are noted. No findings to suggest acute hemorrhage, acute infarction or space-occupying mass lesion are noted. Vascular: No hyperdense vessel or unexpected calcification. Skull: Normal. Negative for fracture or focal lesion. Sinuses/Orbits: No acute finding. Other: None. CT CERVICAL SPINE FINDINGS Alignment: Anterolisthesis of C4 on C5 is noted. Skull base and vertebrae: 7 cervical segments are well visualized. Vertebral body height is well maintained. Disc space narrowing is noted from C4-C7 with associated osteophytic changes. Facet hypertrophic changes are noted most prominent at C4-5 resulting in the anterolisthesis. No acute fracture or acute facet abnormality is noted. Soft tissues and spinal canal: Soft tissues demonstrate a 2.4 cm hypodense nodule within the right lobe of the thyroid. No other soft tissue abnormality is seen. Carotid calcifications are noted. Upper chest: Visualized upper chest is within normal limits. Other: None IMPRESSION: CT of the head: Chronic atrophic and ischemic changes without acute abnormality. CT of the cervical spine: Degenerative changes of the cervical spine are noted without acute abnormality. Hypodense lesion within the right lobe of the thyroid. This has increased from a prior chest CT from 08/28/2016. This may simply represent a an enlarging cyst.  Follow-up can be determined on a clinical basis given the patient's age. Electronically Signed   By: Alcide Clever M.D.   On: 06/04/2017 19:11   Ct Cervical Spine Wo Contrast  Result Date: 06/04/2017 CLINICAL DATA:  Recent fall with head injury and neck pain, initial encounter EXAM: CT HEAD WITHOUT CONTRAST CT CERVICAL SPINE WITHOUT CONTRAST TECHNIQUE:  Multidetector CT imaging of the head and cervical spine was performed following the standard protocol without intravenous contrast. Multiplanar CT image reconstructions of the cervical spine were also generated. COMPARISON:  01/17/2014 FINDINGS: CT HEAD FINDINGS Brain: Atrophic and chronic white matter ischemic changes are noted. No findings to suggest acute hemorrhage, acute infarction or space-occupying mass lesion are noted. Vascular: No hyperdense vessel or unexpected calcification. Skull: Normal. Negative for fracture or focal lesion. Sinuses/Orbits: No acute finding. Other: None. CT CERVICAL SPINE FINDINGS Alignment: Anterolisthesis of C4 on C5 is noted. Skull base and vertebrae: 7 cervical segments are well visualized. Vertebral body height is well maintained. Disc space narrowing is noted from C4-C7 with associated osteophytic changes. Facet hypertrophic changes are noted most prominent at C4-5 resulting in the anterolisthesis. No acute fracture or acute facet abnormality is noted. Soft tissues and spinal canal: Soft tissues demonstrate a 2.4 cm hypodense nodule within the right lobe of the thyroid. No other soft tissue abnormality is seen. Carotid calcifications are noted. Upper chest: Visualized upper chest is within normal limits. Other: None IMPRESSION: CT of the head: Chronic atrophic and ischemic changes without acute abnormality. CT of the cervical spine: Degenerative changes of the cervical spine are noted without acute abnormality. Hypodense lesion within the right lobe of the thyroid. This has increased from a prior chest CT from 08/28/2016. This  may simply represent a an enlarging cyst. Follow-up can be determined on a clinical basis given the patient's age. Electronically Signed   By: Alcide CleverMark  Lukens M.D.   On: 06/04/2017 19:11    Procedures Procedures (including critical care time)  Medications Ordered in ED Medications - No data to display   Initial Impression / Assessment and Plan / ED Course  I have reviewed the triage vital signs and the nursing notes.  Pertinent labs & imaging results that were available during my care of the patient were reviewed by me and considered in my medical decision making (see chart for details).     She did emergency department after mechanical fall.  Complaining of some headache and wrist pain.  She is not on any blood thinners.  Her exam is unremarkable.  CT of the head and cervical spine obtained in triage and is negative.  Discussed with Dr. Patria Maneampos who has seen patient as well.  Will discharge home with close outpatient follow-up.  Return precautions discussed.  Vitals:   06/04/17 1717 06/04/17 1718  BP:  (!) 161/90  Pulse:  75  Resp:  16  Temp:  98.3 F (36.8 C)  TempSrc:  Oral  SpO2:  98%  Weight: 40.8 kg (90 lb)   Height: 4\' 11"  (1.499 m)      Final Clinical Impressions(s) / ED Diagnoses   Final diagnoses:  Injury of head, initial encounter  Fall, initial encounter    ED Discharge Orders    None      Iona CoachKirichenko, Namiyah Grantham, PA-C 06/04/17 2101  Azalia Bilisampos, Kevin, MD 06/04/17 2111

## 2017-06-15 DIAGNOSIS — J189 Pneumonia, unspecified organism: Secondary | ICD-10-CM | POA: Diagnosis not present

## 2017-06-15 DIAGNOSIS — J449 Chronic obstructive pulmonary disease, unspecified: Secondary | ICD-10-CM | POA: Diagnosis not present

## 2017-06-15 DIAGNOSIS — M810 Age-related osteoporosis without current pathological fracture: Secondary | ICD-10-CM | POA: Diagnosis not present

## 2017-06-15 DIAGNOSIS — S32009A Unspecified fracture of unspecified lumbar vertebra, initial encounter for closed fracture: Secondary | ICD-10-CM | POA: Diagnosis not present

## 2017-07-16 DIAGNOSIS — J189 Pneumonia, unspecified organism: Secondary | ICD-10-CM | POA: Diagnosis not present

## 2017-07-16 DIAGNOSIS — S32009A Unspecified fracture of unspecified lumbar vertebra, initial encounter for closed fracture: Secondary | ICD-10-CM | POA: Diagnosis not present

## 2017-07-16 DIAGNOSIS — J449 Chronic obstructive pulmonary disease, unspecified: Secondary | ICD-10-CM | POA: Diagnosis not present

## 2017-07-16 DIAGNOSIS — M810 Age-related osteoporosis without current pathological fracture: Secondary | ICD-10-CM | POA: Diagnosis not present

## 2017-08-15 DIAGNOSIS — J449 Chronic obstructive pulmonary disease, unspecified: Secondary | ICD-10-CM | POA: Diagnosis not present

## 2017-08-15 DIAGNOSIS — J189 Pneumonia, unspecified organism: Secondary | ICD-10-CM | POA: Diagnosis not present

## 2017-08-15 DIAGNOSIS — M810 Age-related osteoporosis without current pathological fracture: Secondary | ICD-10-CM | POA: Diagnosis not present

## 2017-08-15 DIAGNOSIS — S32009A Unspecified fracture of unspecified lumbar vertebra, initial encounter for closed fracture: Secondary | ICD-10-CM | POA: Diagnosis not present

## 2017-09-15 DIAGNOSIS — S32009A Unspecified fracture of unspecified lumbar vertebra, initial encounter for closed fracture: Secondary | ICD-10-CM | POA: Diagnosis not present

## 2017-09-15 DIAGNOSIS — J189 Pneumonia, unspecified organism: Secondary | ICD-10-CM | POA: Diagnosis not present

## 2017-09-15 DIAGNOSIS — J449 Chronic obstructive pulmonary disease, unspecified: Secondary | ICD-10-CM | POA: Diagnosis not present

## 2017-09-15 DIAGNOSIS — M810 Age-related osteoporosis without current pathological fracture: Secondary | ICD-10-CM | POA: Diagnosis not present

## 2017-10-15 DIAGNOSIS — S32009A Unspecified fracture of unspecified lumbar vertebra, initial encounter for closed fracture: Secondary | ICD-10-CM | POA: Diagnosis not present

## 2017-10-15 DIAGNOSIS — J189 Pneumonia, unspecified organism: Secondary | ICD-10-CM | POA: Diagnosis not present

## 2017-10-15 DIAGNOSIS — J449 Chronic obstructive pulmonary disease, unspecified: Secondary | ICD-10-CM | POA: Diagnosis not present

## 2017-10-15 DIAGNOSIS — M810 Age-related osteoporosis without current pathological fracture: Secondary | ICD-10-CM | POA: Diagnosis not present

## 2017-11-15 DIAGNOSIS — J449 Chronic obstructive pulmonary disease, unspecified: Secondary | ICD-10-CM | POA: Diagnosis not present

## 2017-11-15 DIAGNOSIS — J189 Pneumonia, unspecified organism: Secondary | ICD-10-CM | POA: Diagnosis not present

## 2017-11-15 DIAGNOSIS — S32009A Unspecified fracture of unspecified lumbar vertebra, initial encounter for closed fracture: Secondary | ICD-10-CM | POA: Diagnosis not present

## 2017-11-15 DIAGNOSIS — M810 Age-related osteoporosis without current pathological fracture: Secondary | ICD-10-CM | POA: Diagnosis not present

## 2017-12-01 ENCOUNTER — Emergency Department (HOSPITAL_COMMUNITY): Payer: PPO

## 2017-12-01 ENCOUNTER — Emergency Department (HOSPITAL_COMMUNITY)
Admission: EM | Admit: 2017-12-01 | Discharge: 2017-12-01 | Disposition: A | Discharge status: Home / Self Care | Payer: PPO | Attending: Emergency Medicine | Admitting: Emergency Medicine

## 2017-12-01 ENCOUNTER — Encounter (HOSPITAL_COMMUNITY): Payer: Self-pay

## 2017-12-01 ENCOUNTER — Other Ambulatory Visit: Payer: Self-pay

## 2017-12-01 DIAGNOSIS — F0390 Unspecified dementia without behavioral disturbance: Secondary | ICD-10-CM | POA: Insufficient documentation

## 2017-12-01 DIAGNOSIS — Y939 Activity, unspecified: Secondary | ICD-10-CM | POA: Insufficient documentation

## 2017-12-01 DIAGNOSIS — Y999 Unspecified external cause status: Secondary | ICD-10-CM | POA: Diagnosis not present

## 2017-12-01 DIAGNOSIS — S0990XA Unspecified injury of head, initial encounter: Secondary | ICD-10-CM

## 2017-12-01 DIAGNOSIS — I10 Essential (primary) hypertension: Secondary | ICD-10-CM | POA: Diagnosis not present

## 2017-12-01 DIAGNOSIS — R0902 Hypoxemia: Secondary | ICD-10-CM | POA: Diagnosis not present

## 2017-12-01 DIAGNOSIS — S199XXA Unspecified injury of neck, initial encounter: Secondary | ICD-10-CM | POA: Diagnosis not present

## 2017-12-01 DIAGNOSIS — Y92121 Bathroom in nursing home as the place of occurrence of the external cause: Secondary | ICD-10-CM | POA: Diagnosis not present

## 2017-12-01 DIAGNOSIS — S0101XA Laceration without foreign body of scalp, initial encounter: Secondary | ICD-10-CM | POA: Diagnosis not present

## 2017-12-01 DIAGNOSIS — W19XXXA Unspecified fall, initial encounter: Secondary | ICD-10-CM | POA: Diagnosis not present

## 2017-12-01 DIAGNOSIS — Z23 Encounter for immunization: Secondary | ICD-10-CM | POA: Diagnosis not present

## 2017-12-01 DIAGNOSIS — Z79899 Other long term (current) drug therapy: Secondary | ICD-10-CM | POA: Insufficient documentation

## 2017-12-01 DIAGNOSIS — R51 Headache: Secondary | ICD-10-CM | POA: Diagnosis not present

## 2017-12-01 DIAGNOSIS — M542 Cervicalgia: Secondary | ICD-10-CM | POA: Diagnosis not present

## 2017-12-01 MED ORDER — TETANUS-DIPHTHERIA TOXOIDS TD 5-2 LFU IM INJ
0.5000 mL | INJECTION | Freq: Once | INTRAMUSCULAR | Status: AC
  Administered 2017-12-01: 0.5 mL via INTRAMUSCULAR
  Filled 2017-12-01: qty 0.5

## 2017-12-01 MED ORDER — TRAMADOL HCL 50 MG PO TABS
25.0000 mg | ORAL_TABLET | Freq: Once | ORAL | Status: AC
  Administered 2017-12-01: 25 mg via ORAL
  Filled 2017-12-01: qty 1

## 2017-12-01 NOTE — ED Notes (Signed)
MD made aware of patient and family concerns of discomfort from c-collar. CT scans ordered. Patient and family made aware.

## 2017-12-01 NOTE — ED Notes (Addendum)
Pt is bothered by the c-collar and wants it off.

## 2017-12-01 NOTE — ED Notes (Signed)
Family at bedside. 

## 2017-12-01 NOTE — ED Notes (Signed)
Per family, patient took c-collar off. Towel roll applied.

## 2017-12-01 NOTE — ED Triage Notes (Signed)
Per EMS pt from [facility] facility. Pt had an unwitnessed fall in the bathroom and hit her head. EMS reports head lac, denies LOC. Pt complains of rt leg pain and neck pain. Hx of dementia.   BP 134/70 HR 70 96% RA CBG 151

## 2017-12-01 NOTE — ED Provider Notes (Signed)
[facility]-EMERGENCY DEPT Provider Note   CSN: [id number] Arrival date & time: 12/01/17  1341     History   Chief Complaint Chief Complaint  Patient presents with  . Fall    HPI Nancy Meyer is a 84 y.o. female.  84 year old female with prior medical history as detailed below presents for evaluation following fall.  Patient reportedly fell at her facility this morning.  She struck her head.  She did not lose conscious.  She denies neck pain.  She did have a small laceration to the left frontal scalp.  She is unsure of her last tetanus shot.  She denies any other specific injury.  She denies extremity pain.  She denies any back pain.  She denies associated chest pain or shortness of breath. Patient denies associated LOC or syncope.   Family is at bedside.  Family reports the patient falls frequently.  Patient to level fall after a misstep while attempting to navigate between her wheelchair in her bed.  They suspect that her fall this morning was secondary to such a misstep.   The history is provided by the patient and medical records.  Fall  This is a new problem. The current episode started 3 to 5 hours ago. The problem occurs every several days. The problem has not changed since onset.Pertinent negatives include no chest pain, no abdominal pain, no headaches and no shortness of breath. Nothing aggravates the symptoms. Nothing relieves the symptoms. She has tried nothing for the symptoms. The treatment provided no relief.    Past Medical History:  Diagnosis Date  . Anxiety and depression   . Arthritis   . Chronic pain   . Compression fracture of C-spine (HCC)   . Dementia   . Hyperlipidemia   . Hypertension   . Macular degeneration   . Osteoporosis     Patient Active Problem List   Diagnosis Date Noted  . Traumatic compression fracture of T7 thoracic vertebra 08/29/2016  . Traumatic compression fracture of T11 thoracic vertebra (HCC) 08/29/2016    . Compression fracture of body of thoracic vertebra (HCC)   . Generalized abdominal pain   . Malnutrition of moderate degree 08/28/2016  . Acute respiratory failure with hypoxia (HCC) 08/27/2016  . Acute urinary retention 08/27/2016  . Rib fractures 01/17/2014  . Acute encephalopathy 01/17/2014  . Protein-calorie malnutrition, severe (HCC) 12/15/2013  . Compression fracture of L1 lumbar vertebra (HCC) 12/15/2013  . Hypokalemia 12/15/2013  . Constipation 12/14/2013  . Nausea alone 12/04/2013  . Depression 11/06/2013  . Unspecified constipation 11/06/2013  . Compression fracture of L4 lumbar vertebra 11/05/2013  . Fracture of vertebra 11/03/2013  . Inguinal hernia, right. 08/09/2011  . Hyperglycemia 03/01/2011  . Seasonal allergies 03/01/2011  . Hyponatremia 02/28/2011  . Normocytic anemia 02/28/2011  . HTN (hypertension) 02/27/2011  . Diskitis 02/27/2011  . Cataract 02/27/2011  . Hyperlipidemia 02/27/2011  . Osteoporosis     Past Surgical History:  Procedure Laterality Date  . APPENDECTOMY    . BACK SURGERY     kyphoplasty  . EYE SURGERY    . IR RADIOLOGIST EVAL & MGMT  10/01/2016     OB History    Gravida  3   Para  3   Term  3   Preterm      AB      Living        SAB      TAB      Ectopic  Multiple      Live Births               Home Medications    Prior to Admission medications   Medication Sig Start Date End Date Taking? Authorizing Provider  acetaminophen (TYLENOL) 650 MG CR tablet Take 650-1,300 mg by mouth every 8 (eight) hours as needed for pain.    Yes [provider]  ALPRAZolam (XANAX) 0.25 MG tablet Take 0.25 mg by mouth 2 (two) times daily.   Yes [provider]  buPROPion (WELLBUTRIN SR) 150 MG 12 hr tablet Take 150 mg by mouth 2 (two) times daily.    Yes [provider]  calcium citrate-vitamin D (CITRACAL+D) 315-200 MG-UNIT tablet Take 1 tablet by mouth daily with breakfast.   Yes [provider]  carvedilol (COREG) 3.125 MG tablet Take 3.125 mg by mouth 2 (two) times daily with a meal.   Yes [provider]  cholecalciferol (VITAMIN D) 1000 units tablet Take 1,000 Units by mouth daily with breakfast.   Yes [provider]  divalproex (DEPAKOTE SPRINKLE) 125 MG capsule Take 250 mg by mouth at bedtime.   Yes [provider]  docusate sodium (COLACE) 100 MG capsule Take 200 mg by mouth 2 (two) times daily.    Yes [provider]  donepezil (ARICEPT) 10 MG tablet Take 10 mg by mouth at bedtime.    Yes [provider]  famotidine (PEPCID) 20 MG tablet Take 1 tablet (20 mg total) by mouth daily. 09/01/16  Yes Rolly Salter, MD  gabapentin (NEURONTIN) 300 MG capsule Take 300 mg by mouth 4 (four) times daily.   Yes [provider]  loratadine (CLARITIN) 10 MG tablet Take 10 mg by mouth daily with supper.   Yes [provider]  losartan (COZAAR) 100 MG tablet Take 100 mg by mouth daily with breakfast.  07/12/11  Yes [provider]  Melatonin 3 MG TABS Take 3 mg by mouth at bedtime.    Yes [provider]  Menthol, Topical Analgesic, (BIOFREEZE) 4 % GEL Apply 1 application topically 3 (three) times daily.   Yes [provider]  Multiple Vitamins-Minerals (OCUVITE PRESERVISION PO) Take 1 tablet by mouth 2 (two) times daily.   Yes [provider]  nystatin cream (MYCOSTATIN) Apply 1 application topically 3 (three) times daily.   Yes [provider]  ondansetron (ZOFRAN) 4 MG tablet Take 4 mg by mouth every 8 (eight) hours as needed for nausea or vomiting.   Yes [provider]  polyethylene glycol (CLEARLAX) packet Take 17 g by mouth daily as needed for mild constipation.   Yes [provider]  potassium chloride SA (K-DUR,KLOR-CON) 20 MEQ tablet Take 20 mEq by mouth daily.   Yes [provider]  psyllium (METAMUCIL) 58.6 % packet Take 1 packet by mouth  daily with breakfast.   Yes [provider]  Simethicone 125 MG TABS Take 1 tablet by mouth every 6 (six) hours as needed (for gas).   Yes [provider]  traMADol (ULTRAM) 50 MG tablet Take 0.5 tablets (25 mg total) by mouth every 6 (six) hours as needed for severe pain. Patient taking differently: Take 50 mg by mouth every 6 (six) hours as needed for severe pain.  08/31/16  Yes Rolly Salter, MD  ciprofloxacin (CIPRO) 500 MG tablet Take 1 tablet (500 mg total) by mouth 2 (two) times daily. Patient not taking: Reported on 12/01/2017 03/31/17   Preston Fleeting,  Onalee Hua, MD  feeding supplement (BOOST / RESOURCE BREEZE) LIQD Take 1 Container by mouth 3 (three) times daily between meals. Patient not taking: Reported on 12/01/2017 08/31/16   Rolly Salter, MD  furosemide (LASIX) 20 MG tablet Take 1 tablet (20 mg total) by mouth daily. Patient not taking: Reported on 12/01/2017 03/31/17   Dione Booze, MD  metroNIDAZOLE (FLAGYL) 500 MG tablet Take 1 tablet (500 mg total) by mouth 3 (three) times daily. Patient not taking: Reported on 12/01/2017 03/31/17   Dione Booze, MD    Family History Family History  Problem Relation Age of Onset  . Cancer Daughter        breast    Social History Social History   Tobacco Use  . Smoking status: Never Smoker  . Smokeless tobacco: Never Used  Substance Use Topics  . Alcohol use: No  . Drug use: No     Allergies   Macrobid [nitrofurantoin]   Review of Systems Review of Systems  Respiratory: Negative for shortness of breath.   Cardiovascular: Negative for chest pain.  Gastrointestinal: Negative for abdominal pain.  Neurological: Negative for headaches.  All other systems reviewed and are negative.    Physical Exam Updated Vital Signs BP (!) 119/50 (BP Location: Left Arm)   Pulse 61   Temp 97.8 F (36.6 C) (Axillary)   Resp 17   SpO2 93%   Physical Exam  Constitutional: She is oriented to person, place, and time. She appears well-developed  and well-nourished. No distress.  HENT:  Head: Normocephalic.  Mouth/Throat: Oropharynx is clear and moist.  Small laceration 0.5 cm to left frontal scalp   Eyes: Pupils are equal, round, and reactive to light. Conjunctivae and EOM are normal.  Neck: Normal range of motion. Neck supple.  Cardiovascular: Normal rate, regular rhythm and normal heart sounds.  Pulmonary/Chest: Effort normal and breath sounds normal. No respiratory distress.  Abdominal: Soft. She exhibits no distension. There is no tenderness.  Musculoskeletal: Normal range of motion. She exhibits no edema or deformity.  Neurological: She is alert and oriented to person, place, and time.  GCS 15     Skin: Skin is warm and dry.  Psychiatric: She has a normal mood and affect.  Nursing note and vitals reviewed.    ED Treatments / Results  Labs (all labs ordered are listed, but only abnormal results are displayed) Labs Reviewed - No data to display  EKG None  Radiology Ct Head Wo Contrast  Result Date: 12/01/2017 CLINICAL DATA:  Unwitnessed fall.  Pain. EXAM: CT HEAD WITHOUT CONTRAST CT CERVICAL SPINE WITHOUT CONTRAST TECHNIQUE: Multidetector CT imaging of the head and cervical spine was performed following the standard protocol without intravenous contrast. Multiplanar CT image reconstructions of the cervical spine were also generated. COMPARISON:  None. FINDINGS: CT HEAD FINDINGS Brain: No subdural, epidural, or subarachnoid hemorrhage. Cerebellum, brainstem, and basal cisterns are normal. Ventricles and sulci are prominent stable. No mass effect or midline shift. White matter changes noted. No acute cortical ischemia or infarct. Vascular: No hyperdense vessel or unexpected calcification. Skull: Normal. Negative for fracture or focal lesion. Sinuses/Orbits: No acute finding. Other: None. CT CERVICAL SPINE FINDINGS Alignment: There is 3 mm of anterolisthesis of C4 versus C5 which is a stable finding. No other malalignment  identified. Skull base and vertebrae: No acute fractures are identified. Soft tissues and spinal canal: There is a nodule in the right thyroid lobe measuring up to 2 cm. Disc levels:  Multilevel degenerative changes. Upper  chest: Negative. Other: No other abnormalities. IMPRESSION: 1. No acute intracranial abnormalities noted. 2. No acute malalignment or fracture of the cervical spine. 3. 3 mm of anterolisthesis of C4 versus C5 is stable. Multilevel degenerative changes. 4. 2 cm nodule in the right thyroid lobe. An ultrasound could better evaluate if not previously evaluated. Electronically Signed   By: Gerome Sam III M.D   On: 12/01/2017 15:29   Ct Cervical Spine Wo Contrast  Result Date: 12/01/2017 CLINICAL DATA:  Unwitnessed fall.  Pain. EXAM: CT HEAD WITHOUT CONTRAST CT CERVICAL SPINE WITHOUT CONTRAST TECHNIQUE: Multidetector CT imaging of the head and cervical spine was performed following the standard protocol without intravenous contrast. Multiplanar CT image reconstructions of the cervical spine were also generated. COMPARISON:  None. FINDINGS: CT HEAD FINDINGS Brain: No subdural, epidural, or subarachnoid hemorrhage. Cerebellum, brainstem, and basal cisterns are normal. Ventricles and sulci are prominent stable. No mass effect or midline shift. White matter changes noted. No acute cortical ischemia or infarct. Vascular: No hyperdense vessel or unexpected calcification. Skull: Normal. Negative for fracture or focal lesion. Sinuses/Orbits: No acute finding. Other: None. CT CERVICAL SPINE FINDINGS Alignment: There is 3 mm of anterolisthesis of C4 versus C5 which is a stable finding. No other malalignment identified. Skull base and vertebrae: No acute fractures are identified. Soft tissues and spinal canal: There is a nodule in the right thyroid lobe measuring up to 2 cm. Disc levels:  Multilevel degenerative changes. Upper chest: Negative. Other: No other abnormalities. IMPRESSION: 1. No acute  intracranial abnormalities noted. 2. No acute malalignment or fracture of the cervical spine. 3. 3 mm of anterolisthesis of C4 versus C5 is stable. Multilevel degenerative changes. 4. 2 cm nodule in the right thyroid lobe. An ultrasound could better evaluate if not previously evaluated. Electronically Signed   By: Gerome Sam III M.D   On: 12/01/2017 15:29    Procedures Procedures (including critical care time) LACERATION REPAIR Performed by: Wynetta Fines Authorized by: Wynetta Fines Consent: Verbal consent obtained. Risks and benefits: risks, benefits and alternatives were discussed Consent given by: patient Patient identity confirmed: provided demographic data Prepped and Draped in normal sterile fashion Wound explored  Laceration Location: left frontal scalp  Laceration Length: 0.5 cm  No Foreign Bodies seen or palpated  Anesthesia:   Local anesthetic:   Anesthetic total:   Irrigation method: syringe Amount of cleaning: standard  Skin closure: single staple   Number of sutures: 1  Technique: single staple placed with good closure of wound   Patient tolerance: Patient tolerated the procedure well with no immediate complications.  Medications Ordered in ED Medications  tetanus & diphtheria toxoids (adult) (TENIVAC) injection 0.5 mL (has no administration in time range)  traMADol (ULTRAM) tablet 25 mg (has no administration in time range)     Initial Impression / Assessment and Plan / ED Course  I have reviewed the triage vital signs and the nursing notes.  Pertinent labs & imaging results that were available during my care of the patient were reviewed by me and considered in my medical decision making (see chart for details).     MDM  Screen complete  Patient is presenting for evaluation following reported fall.  She sustained a minor laceration to the scalp.  No other evidence of significant traumatic injury found on exam or on work-up.  Laceration  repaired.  Patient appears to be stable for discharge home.  Strict return precautions given and understood.  Importance of close follow-up  stressed.  Final Clinical Impressions(s) / ED Diagnoses   Final diagnoses:  Fall, initial encounter  Laceration of scalp, initial encounter  Closed head injury, initial encounter    ED Discharge Orders    None       Wynetta Fines, MD 12/01/17 [phone]

## 2017-12-01 NOTE — ED Notes (Signed)
ED Provider at bedside. 

## 2017-12-01 NOTE — ED Notes (Signed)
Patient arrived with c-collar on and aligned from EMS. Patient requesting c-collar removal. Made patient and family aware collar cannot be removed until after cleared by MD.

## 2017-12-01 NOTE — ED Notes (Signed)
Patient transported to CT 

## 2017-12-01 NOTE — Discharge Instructions (Addendum)
Please return for any problem. Follow up with your regular physician in 2-3 days. Your scalp laceration was closed with a single staple - this should be removed in 5-7 days.

## 2017-12-16 DIAGNOSIS — M810 Age-related osteoporosis without current pathological fracture: Secondary | ICD-10-CM | POA: Diagnosis not present

## 2017-12-16 DIAGNOSIS — J449 Chronic obstructive pulmonary disease, unspecified: Secondary | ICD-10-CM | POA: Diagnosis not present

## 2017-12-16 DIAGNOSIS — S32009A Unspecified fracture of unspecified lumbar vertebra, initial encounter for closed fracture: Secondary | ICD-10-CM | POA: Diagnosis not present

## 2017-12-16 DIAGNOSIS — J189 Pneumonia, unspecified organism: Secondary | ICD-10-CM | POA: Diagnosis not present

## 2017-12-31 DIAGNOSIS — I10 Essential (primary) hypertension: Secondary | ICD-10-CM | POA: Diagnosis not present

## 2018-01-15 DIAGNOSIS — J189 Pneumonia, unspecified organism: Secondary | ICD-10-CM | POA: Diagnosis not present

## 2018-01-15 DIAGNOSIS — M810 Age-related osteoporosis without current pathological fracture: Secondary | ICD-10-CM | POA: Diagnosis not present

## 2018-01-15 DIAGNOSIS — S32009A Unspecified fracture of unspecified lumbar vertebra, initial encounter for closed fracture: Secondary | ICD-10-CM | POA: Diagnosis not present

## 2018-01-15 DIAGNOSIS — J449 Chronic obstructive pulmonary disease, unspecified: Secondary | ICD-10-CM | POA: Diagnosis not present

## 2018-02-27 DIAGNOSIS — F4322 Adjustment disorder with anxiety: Secondary | ICD-10-CM | POA: Diagnosis not present

## 2018-03-21 DIAGNOSIS — M25572 Pain in left ankle and joints of left foot: Secondary | ICD-10-CM | POA: Diagnosis not present

## 2018-03-21 DIAGNOSIS — M79671 Pain in right foot: Secondary | ICD-10-CM | POA: Diagnosis not present

## 2018-03-21 DIAGNOSIS — M25571 Pain in right ankle and joints of right foot: Secondary | ICD-10-CM | POA: Diagnosis not present

## 2018-03-21 DIAGNOSIS — M79672 Pain in left foot: Secondary | ICD-10-CM | POA: Diagnosis not present

## 2018-04-10 ENCOUNTER — Emergency Department (HOSPITAL_COMMUNITY): Payer: PPO

## 2018-04-10 ENCOUNTER — Other Ambulatory Visit: Payer: Self-pay

## 2018-04-10 ENCOUNTER — Encounter (HOSPITAL_COMMUNITY): Payer: Self-pay | Admitting: Emergency Medicine

## 2018-04-10 ENCOUNTER — Inpatient Hospital Stay (HOSPITAL_COMMUNITY)
Admission: EM | Admit: 2018-04-10 | Discharge: 2018-04-14 | DRG: 193 | Disposition: A | Discharge status: Skilled Nursing Facility | Payer: PPO | Attending: Internal Medicine | Admitting: Internal Medicine

## 2018-04-10 DIAGNOSIS — M810 Age-related osteoporosis without current pathological fracture: Secondary | ICD-10-CM | POA: Diagnosis present

## 2018-04-10 DIAGNOSIS — Z681 Body mass index (BMI) 19 or less, adult: Secondary | ICD-10-CM | POA: Diagnosis not present

## 2018-04-10 DIAGNOSIS — G894 Chronic pain syndrome: Secondary | ICD-10-CM | POA: Diagnosis not present

## 2018-04-10 DIAGNOSIS — J189 Pneumonia, unspecified organism: Secondary | ICD-10-CM | POA: Diagnosis not present

## 2018-04-10 DIAGNOSIS — I10 Essential (primary) hypertension: Secondary | ICD-10-CM | POA: Diagnosis present

## 2018-04-10 DIAGNOSIS — N390 Urinary tract infection, site not specified: Secondary | ICD-10-CM | POA: Diagnosis present

## 2018-04-10 DIAGNOSIS — Z888 Allergy status to other drugs, medicaments and biological substances status: Secondary | ICD-10-CM | POA: Diagnosis not present

## 2018-04-10 DIAGNOSIS — R0902 Hypoxemia: Secondary | ICD-10-CM | POA: Diagnosis not present

## 2018-04-10 DIAGNOSIS — J9601 Acute respiratory failure with hypoxia: Secondary | ICD-10-CM | POA: Diagnosis not present

## 2018-04-10 DIAGNOSIS — F329 Major depressive disorder, single episode, unspecified: Secondary | ICD-10-CM | POA: Diagnosis not present

## 2018-04-10 DIAGNOSIS — R05 Cough: Secondary | ICD-10-CM | POA: Diagnosis not present

## 2018-04-10 DIAGNOSIS — E43 Unspecified severe protein-calorie malnutrition: Secondary | ICD-10-CM | POA: Diagnosis not present

## 2018-04-10 DIAGNOSIS — F419 Anxiety disorder, unspecified: Secondary | ICD-10-CM | POA: Diagnosis present

## 2018-04-10 DIAGNOSIS — E785 Hyperlipidemia, unspecified: Secondary | ICD-10-CM | POA: Diagnosis not present

## 2018-04-10 DIAGNOSIS — B952 Enterococcus as the cause of diseases classified elsewhere: Secondary | ICD-10-CM | POA: Diagnosis present

## 2018-04-10 DIAGNOSIS — I160 Hypertensive urgency: Secondary | ICD-10-CM | POA: Diagnosis present

## 2018-04-10 DIAGNOSIS — J168 Pneumonia due to other specified infectious organisms: Secondary | ICD-10-CM | POA: Diagnosis not present

## 2018-04-10 DIAGNOSIS — Z66 Do not resuscitate: Secondary | ICD-10-CM | POA: Diagnosis not present

## 2018-04-10 DIAGNOSIS — R531 Weakness: Secondary | ICD-10-CM | POA: Diagnosis not present

## 2018-04-10 DIAGNOSIS — F03C0 Unspecified dementia, severe, without behavioral disturbance, psychotic disturbance, mood disturbance, and anxiety: Secondary | ICD-10-CM | POA: Diagnosis present

## 2018-04-10 DIAGNOSIS — F4322 Adjustment disorder with anxiety: Secondary | ICD-10-CM | POA: Diagnosis not present

## 2018-04-10 DIAGNOSIS — H3530 Unspecified macular degeneration: Secondary | ICD-10-CM | POA: Diagnosis not present

## 2018-04-10 DIAGNOSIS — F0390 Unspecified dementia without behavioral disturbance: Secondary | ICD-10-CM | POA: Diagnosis not present

## 2018-04-10 DIAGNOSIS — Z79899 Other long term (current) drug therapy: Secondary | ICD-10-CM | POA: Diagnosis not present

## 2018-04-10 DIAGNOSIS — M1990 Unspecified osteoarthritis, unspecified site: Secondary | ICD-10-CM | POA: Diagnosis present

## 2018-04-10 DIAGNOSIS — R451 Restlessness and agitation: Secondary | ICD-10-CM | POA: Diagnosis not present

## 2018-04-10 DIAGNOSIS — J181 Lobar pneumonia, unspecified organism: Secondary | ICD-10-CM

## 2018-04-10 LAB — PROCALCITONIN: Procalcitonin: <0.1 ng/mL

## 2018-04-10 LAB — COMPREHENSIVE METABOLIC PANEL
ALT: 12 U/L (ref 0–44)
AST: 15 U/L (ref 15–41)
Albumin: 3.5 g/dL (ref 3.5–5.0)
Alkaline Phosphatase: 48 U/L (ref 38–126)
Anion gap: 10 (ref 5–15)
BUN: 27 mg/dL — ABNORMAL HIGH (ref 8–23)
CHLORIDE: 103 mmol/L (ref 98–111)
CO2: 25 mmol/L (ref 22–32)
Calcium: 9 mg/dL (ref 8.9–10.3)
Creatinine, Ser: 0.7 mg/dL (ref 0.44–1.00)
GFR calc Af Amer: >60 mL/min (ref >60)
GFR calc non Af Amer: >60 mL/min (ref >60)
Glucose, Bld: 106 mg/dL — ABNORMAL HIGH (ref 70–99)
Potassium: 4.6 mmol/L (ref 3.5–5.1)
Sodium: 138 mmol/L (ref 135–145)
Total Bilirubin: 0.6 mg/dL (ref 0.3–1.2)
Total Protein: 6.4 g/dL — ABNORMAL LOW (ref 6.5–8.1)

## 2018-04-10 LAB — URINALYSIS, ROUTINE W REFLEX MICROSCOPIC
Bilirubin Urine: NEGATIVE
Glucose, UA: NEGATIVE mg/dL
Hgb urine dipstick: NEGATIVE
Ketones, ur: 20 mg/dL — AB
Leukocytes, UA: NEGATIVE
Nitrite: NEGATIVE
Protein, ur: NEGATIVE mg/dL
Specific Gravity, Urine: 1.02 (ref 1.005–1.030)
pH: 6 (ref 5.0–8.0)

## 2018-04-10 LAB — INFLUENZA PANEL BY PCR (TYPE A & B)
Influenza A By PCR: NEGATIVE
Influenza B By PCR: NEGATIVE

## 2018-04-10 LAB — CBC
HCT: 36.7 % (ref 36.0–46.0)
Hemoglobin: 10.8 g/dL — ABNORMAL LOW (ref 12.0–15.0)
MCH: 31.1 pg (ref 26.0–34.0)
MCHC: 29.4 g/dL — ABNORMAL LOW (ref 30.0–36.0)
MCV: 105.8 fL — ABNORMAL HIGH (ref 80.0–100.0)
Platelets: 251 10*3/uL (ref 150–400)
RBC: 3.47 MIL/uL — AB (ref 3.87–5.11)
RDW: 14.1 % (ref 11.5–15.5)
WBC: 10.7 10*3/uL — ABNORMAL HIGH (ref 4.0–10.5)
nRBC: 0 % (ref 0.0–0.2)

## 2018-04-10 LAB — I-STAT TROPONIN, ED: Troponin i, poc: 0.01 ng/mL (ref 0.00–0.08)

## 2018-04-10 LAB — CBG MONITORING, ED: Glucose-Capillary: 97 mg/dL (ref 70–99)

## 2018-04-10 LAB — I-STAT CG4 LACTIC ACID, ED: Lactic Acid, Venous: 0.84 mmol/L (ref 0.5–1.9)

## 2018-04-10 MED ORDER — ACETAMINOPHEN 325 MG PO TABS
650.0000 mg | ORAL_TABLET | Freq: Four times a day (QID) | ORAL | Status: DC | PRN
  Administered 2018-04-11 – 2018-04-14 (×5): 650 mg via ORAL
  Filled 2018-04-10 (×5): qty 2

## 2018-04-10 MED ORDER — AZITHROMYCIN 250 MG PO TABS
250.0000 mg | ORAL_TABLET | Freq: Every day | ORAL | Status: DC
  Administered 2018-04-11 – 2018-04-14 (×4): 250 mg via ORAL
  Filled 2018-04-10 (×4): qty 1

## 2018-04-10 MED ORDER — SODIUM CHLORIDE 0.9 % IV SOLN
500.0000 mg | Freq: Once | INTRAVENOUS | Status: AC
  Administered 2018-04-10: 500 mg via INTRAVENOUS
  Filled 2018-04-10: qty 500

## 2018-04-10 MED ORDER — FAMOTIDINE 20 MG PO TABS
20.0000 mg | ORAL_TABLET | Freq: Every day | ORAL | Status: DC
  Administered 2018-04-11 – 2018-04-14 (×4): 20 mg via ORAL
  Filled 2018-04-10 (×4): qty 1

## 2018-04-10 MED ORDER — SODIUM CHLORIDE 0.9 % IV SOLN
1.0000 g | INTRAVENOUS | Status: DC
  Administered 2018-04-11 – 2018-04-13 (×2): 1 g via INTRAVENOUS
  Filled 2018-04-10 (×2): qty 1
  Filled 2018-04-10 (×2): qty 10

## 2018-04-10 MED ORDER — DIVALPROEX SODIUM 125 MG PO CSDR
250.0000 mg | DELAYED_RELEASE_CAPSULE | Freq: Two times a day (BID) | ORAL | Status: DC
  Administered 2018-04-10 – 2018-04-14 (×8): 250 mg via ORAL
  Filled 2018-04-10 (×9): qty 2

## 2018-04-10 MED ORDER — HALOPERIDOL LACTATE 5 MG/ML IJ SOLN
2.0000 mg | Freq: Once | INTRAMUSCULAR | Status: AC
  Administered 2018-04-11: 2 mg via INTRAVENOUS
  Filled 2018-04-10: qty 1

## 2018-04-10 MED ORDER — POLYETHYLENE GLYCOL 3350 17 G PO PACK: 17.0000 g | PACK | Freq: Every day | ORAL | Status: DC | PRN

## 2018-04-10 MED ORDER — LORATADINE 10 MG PO TABS
10.0000 mg | ORAL_TABLET | Freq: Every day | ORAL | Status: DC
  Administered 2018-04-11 – 2018-04-12 (×2): 10 mg via ORAL
  Filled 2018-04-10 (×3): qty 1

## 2018-04-10 MED ORDER — ACETAMINOPHEN 650 MG RE SUPP: 650.0000 mg | Freq: Four times a day (QID) | RECTAL | Status: DC | PRN

## 2018-04-10 MED ORDER — LOSARTAN POTASSIUM 50 MG PO TABS
100.0000 mg | ORAL_TABLET | Freq: Every day | ORAL | Status: DC
  Administered 2018-04-11 – 2018-04-14 (×3): 100 mg via ORAL
  Filled 2018-04-10 (×4): qty 2

## 2018-04-10 MED ORDER — MAGNESIUM CITRATE PO SOLN: 1.0000 | Freq: Once | ORAL | Status: DC | PRN

## 2018-04-10 MED ORDER — SODIUM CHLORIDE 0.9 % IV BOLUS
500.0000 mL | Freq: Once | INTRAVENOUS | Status: AC
  Administered 2018-04-10: 500 mL via INTRAVENOUS

## 2018-04-10 MED ORDER — BUPROPION HCL ER (XL) 300 MG PO TB24
300.0000 mg | ORAL_TABLET | Freq: Every day | ORAL | Status: DC
  Administered 2018-04-11: 300 mg via ORAL
  Filled 2018-04-10: qty 1

## 2018-04-10 MED ORDER — ALPRAZOLAM 0.25 MG PO TABS
0.2500 mg | ORAL_TABLET | Freq: Two times a day (BID) | ORAL | Status: DC
  Administered 2018-04-10 – 2018-04-14 (×8): 0.25 mg via ORAL
  Filled 2018-04-10 (×8): qty 1

## 2018-04-10 MED ORDER — SIMETHICONE 80 MG PO CHEW: 80.0000 mg | CHEWABLE_TABLET | Freq: Four times a day (QID) | ORAL | Status: DC | PRN

## 2018-04-10 MED ORDER — CALCIUM CARBONATE-VITAMIN D 500-200 MG-UNIT PO TABS
1.0000 | ORAL_TABLET | Freq: Every day | ORAL | Status: DC
  Administered 2018-04-11 – 2018-04-14 (×4): 1 via ORAL
  Filled 2018-04-10 (×5): qty 1

## 2018-04-10 MED ORDER — DIVALPROEX SODIUM 125 MG PO CSDR
125.0000 mg | DELAYED_RELEASE_CAPSULE | ORAL | Status: DC
  Administered 2018-04-11 – 2018-04-13 (×3): 125 mg via ORAL
  Filled 2018-04-10 (×4): qty 1

## 2018-04-10 MED ORDER — MUSCLE RUB 10-15 % EX CREA
TOPICAL_CREAM | Freq: Three times a day (TID) | CUTANEOUS | Status: DC
  Administered 2018-04-12: 1 via TOPICAL
  Administered 2018-04-13 – 2018-04-14 (×4): via TOPICAL
  Filled 2018-04-10: qty 85

## 2018-04-10 MED ORDER — GABAPENTIN 300 MG PO CAPS
600.0000 mg | ORAL_CAPSULE | Freq: Two times a day (BID) | ORAL | Status: DC
  Administered 2018-04-10 – 2018-04-14 (×7): 600 mg via ORAL
  Filled 2018-04-10 (×8): qty 2

## 2018-04-10 MED ORDER — SODIUM CHLORIDE 0.9 % IV SOLN
1.0000 g | Freq: Once | INTRAVENOUS | Status: AC
  Administered 2018-04-10: 1 g via INTRAVENOUS
  Filled 2018-04-10: qty 10

## 2018-04-10 MED ORDER — SODIUM CHLORIDE 0.9 % IV SOLN
INTRAVENOUS | Status: AC
  Administered 2018-04-10: via INTRAVENOUS

## 2018-04-10 MED ORDER — TRAMADOL HCL 50 MG PO TABS
50.0000 mg | ORAL_TABLET | Freq: Four times a day (QID) | ORAL | Status: DC | PRN
  Administered 2018-04-11 – 2018-04-14 (×4): 50 mg via ORAL
  Filled 2018-04-10 (×4): qty 1

## 2018-04-10 MED ORDER — CALCIUM CITRATE-VITAMIN D 315-200 MG-UNIT PO TABS: 1.0000 | ORAL_TABLET | Freq: Every day | ORAL | Status: DC

## 2018-04-10 MED ORDER — BOOST / RESOURCE BREEZE PO LIQD: 1.0000 | Freq: Three times a day (TID) | ORAL | Status: DC

## 2018-04-10 MED ORDER — VITAMIN D 25 MCG (1000 UNIT) PO TABS
1000.0000 [IU] | ORAL_TABLET | Freq: Every day | ORAL | Status: DC
  Administered 2018-04-11 – 2018-04-14 (×4): 1000 [IU] via ORAL
  Filled 2018-04-10 (×5): qty 1

## 2018-04-10 MED ORDER — MENTHOL (TOPICAL ANALGESIC) 4 % EX GEL: 1.0000 "application " | Freq: Three times a day (TID) | CUTANEOUS | Status: DC

## 2018-04-10 MED ORDER — ENSURE ENLIVE PO LIQD
237.0000 mL | Freq: Two times a day (BID) | ORAL | Status: DC
  Administered 2018-04-11 – 2018-04-14 (×4): 237 mL via ORAL

## 2018-04-10 MED ORDER — GABAPENTIN 300 MG PO CAPS
300.0000 mg | ORAL_CAPSULE | ORAL | Status: DC
  Administered 2018-04-11 – 2018-04-12 (×2): 300 mg via ORAL
  Filled 2018-04-10 (×2): qty 1

## 2018-04-10 MED ORDER — PSYLLIUM 95 % PO PACK
1.0000 | PACK | Freq: Two times a day (BID) | ORAL | Status: DC
  Administered 2018-04-10 – 2018-04-13 (×4): 1 via ORAL
  Filled 2018-04-10 (×5): qty 1

## 2018-04-10 MED ORDER — DONEPEZIL HCL 10 MG PO TABS
10.0000 mg | ORAL_TABLET | Freq: Every day | ORAL | Status: DC
  Administered 2018-04-10 – 2018-04-13 (×4): 10 mg via ORAL
  Filled 2018-04-10 (×4): qty 1

## 2018-04-10 MED ORDER — CARVEDILOL 3.125 MG PO TABS
3.1250 mg | ORAL_TABLET | Freq: Two times a day (BID) | ORAL | Status: DC
  Administered 2018-04-10 – 2018-04-14 (×8): 3.125 mg via ORAL
  Filled 2018-04-10 (×8): qty 1

## 2018-04-10 MED ORDER — ONDANSETRON HCL 4 MG PO TABS: 4.0000 mg | ORAL_TABLET | Freq: Four times a day (QID) | ORAL | Status: DC | PRN

## 2018-04-10 MED ORDER — DOCUSATE SODIUM 100 MG PO CAPS
200.0000 mg | ORAL_CAPSULE | Freq: Two times a day (BID) | ORAL | Status: DC
  Administered 2018-04-10: 200 mg via ORAL
  Filled 2018-04-10: qty 2

## 2018-04-10 MED ORDER — ONDANSETRON HCL 4 MG/2ML IJ SOLN
4.0000 mg | Freq: Four times a day (QID) | INTRAMUSCULAR | Status: DC | PRN
  Administered 2018-04-14: 4 mg via INTRAVENOUS
  Filled 2018-04-10: qty 2

## 2018-04-10 MED ORDER — MELATONIN 3 MG PO TABS
3.0000 mg | ORAL_TABLET | Freq: Every day | ORAL | Status: DC
  Administered 2018-04-10 – 2018-04-13 (×4): 3 mg via ORAL
  Filled 2018-04-10 (×4): qty 1

## 2018-04-10 MED ORDER — ENOXAPARIN SODIUM 30 MG/0.3ML ~~LOC~~ SOLN
30.0000 mg | SUBCUTANEOUS | Status: DC
  Administered 2018-04-10 – 2018-04-13 (×4): 30 mg via SUBCUTANEOUS
  Filled 2018-04-10 (×4): qty 0.3

## 2018-04-10 MED ORDER — NYSTATIN 100000 UNIT/GM EX CREA
1.0000 "application " | TOPICAL_CREAM | Freq: Three times a day (TID) | CUTANEOUS | Status: DC
  Administered 2018-04-13 – 2018-04-14 (×2): 1 via TOPICAL
  Filled 2018-04-10: qty 15

## 2018-04-10 NOTE — Patient Outreach (Signed)
[facility]) Care Management  04/10/2018  Nancy Meyer 1920/05/04 [id number]  BSW received patient referral on today's date indicating the patients POA is interested in sitter/caregiver resources to assist with patient care. Prior to outreaching POA it is noted the patient is currently in the ED. BSW will plan outreach at a later date.  Bevelyn Ngo, BSW, CDP [facility] [phone]

## 2018-04-10 NOTE — H&P (Signed)
History and Physical    Nancy Meyer RSW:[id number] DOB: [birth year] DOA: 04/10/2018  PCP: Kaleen Mask, MD   Patient coming from: Memory care facility   Chief Complaint: Lethargy, weakness  HPI: Nancy Meyer is a 85 y.o. female with medical history significant of hypertension, depression/anxiety, dementia, osteoporosis, chronic lower extremity edema, chronic pain who presented from memory care facility with 1 week of increasing lethargy, difficulty transferring, slight cough and found to have likely community-acquired pneumonia.  Patient cannot participate much in history due to underlying dementia.  Per family and ER notes patient apparently was doing well until the past week when she began to have difficulty transferring from her chair to the bed or the commode.  Patient had more falls than usual.  Patient was also noted to have a dry cough and started on Mucinex DM which family is concerned about is contributing to her current lethargy.  Patient is noted to be less active with decreased p.o. intake and less interactive in general.  She has not had a fever, nausea, vomiting, diarrhea.  They have noted that her urine output is decreased. On discussion with the patient she only reports that she feels tired.  She denies any chest pain, nausea, abdominal pain, congestion, rhinorrhea, cough.  ED Course: In the emergency department patient's vitals were notable for mild hypoxia for which she was put on 2 L.  Labs are notable for mildly elevated white blood cell count of 10, hemoglobin of 10.8 with profound macrocytosis, CMP was unremarkable.  Blood cultures were taken.  Chest x-ray showed possible chronically elevated right hemidiaphragm as well as possible pneumonia on that side.  Influenza panel is ordered and is pending.  Troponin was unremarkable.  Urine culture and urinalysis is pending.  Lactate was normal.  Review of Systems: As per HPI otherwise 10 point review of systems  negative.    Past Medical History:  Diagnosis Date  . Anxiety and depression   . Arthritis   . Chronic pain   . Compression fracture of C-spine (HCC)   . Dementia (HCC)   . Hyperlipidemia   . Hypertension   . Macular degeneration   . Osteoporosis     Past Surgical History:  Procedure Laterality Date  . APPENDECTOMY    . BACK SURGERY     kyphoplasty  . EYE SURGERY    . IR RADIOLOGIST EVAL & MGMT  10/01/2016     reports that she has never smoked. She has never used smokeless tobacco. She reports that she does not drink alcohol or use drugs.  Allergies  Allergen Reactions  . Macrobid [Nitrofurantoin] Itching    Family History  Problem Relation Age of Onset  . Cancer Daughter        breast    Prior to Admission medications   Medication Sig Start Date End Date Taking? Authorizing Provider  acetaminophen (TYLENOL) 650 MG CR tablet Take 650-1,300 mg by mouth every 8 (eight) hours as needed for pain.     [provider]  ALPRAZolam Prudy Feeler) 0.25 MG tablet Take 0.25 mg by mouth 2 (two) times daily.    [provider]  buPROPion (WELLBUTRIN SR) 150 MG 12 hr tablet Take 150 mg by mouth 2 (two) times daily.     [provider]  calcium citrate-vitamin D (CITRACAL+D) 315-200 MG-UNIT tablet Take 1 tablet by mouth daily with breakfast.    [provider]  carvedilol (COREG) 3.125 MG tablet Take 3.125 mg by mouth 2 (  two) times daily with a meal.    [provider]  cholecalciferol (VITAMIN D) 1000 units tablet Take 1,000 Units by mouth daily with breakfast.    [provider]  ciprofloxacin (CIPRO) 500 MG tablet Take 1 tablet (500 mg total) by mouth 2 (two) times daily. Patient not taking: Reported on 12/01/2017 03/31/17   Dione BoozeGlick, David, MD  divalproex (DEPAKOTE SPRINKLE) 125 MG capsule Take 250 mg by mouth at bedtime.    [provider]  docusate sodium (COLACE) 100 MG capsule Take 200 mg by mouth 2 (two) times daily.      [provider]  donepezil (ARICEPT) 10 MG tablet Take 10 mg by mouth at bedtime.     [provider]  famotidine (PEPCID) 20 MG tablet Take 1 tablet (20 mg total) by mouth daily. 09/01/16   Rolly SalterPatel, Pranav M, MD  feeding supplement (BOOST / RESOURCE BREEZE) LIQD Take 1 Container by mouth 3 (three) times daily between meals. Patient not taking: Reported on 12/01/2017 08/31/16   Rolly SalterPatel, Pranav M, MD  furosemide (LASIX) 20 MG tablet Take 1 tablet (20 mg total) by mouth daily. Patient not taking: Reported on 12/01/2017 03/31/17   Dione BoozeGlick, David, MD  gabapentin (NEURONTIN) 300 MG capsule Take 300 mg by mouth 4 (four) times daily.    [provider]  loratadine (CLARITIN) 10 MG tablet Take 10 mg by mouth daily with supper.    [provider]  losartan (COZAAR) 100 MG tablet Take 100 mg by mouth daily with breakfast.  07/12/11   [provider]  Melatonin 3 MG TABS Take 3 mg by mouth at bedtime.     [provider]  Menthol, Topical Analgesic, (BIOFREEZE) 4 % GEL Apply 1 application topically 3 (three) times daily.    [provider]  metroNIDAZOLE (FLAGYL) 500 MG tablet Take 1 tablet (500 mg total) by mouth 3 (three) times daily. Patient not taking: Reported on 12/01/2017 03/31/17   Dione BoozeGlick, David, MD  Multiple Vitamins-Minerals (OCUVITE PRESERVISION PO) Take 1 tablet by mouth 2 (two) times daily.    [provider]  nystatin cream (MYCOSTATIN) Apply 1 application topically 3 (three) times daily.    [provider]  ondansetron (ZOFRAN) 4 MG tablet Take 4 mg by mouth every 8 (eight) hours as needed for nausea or vomiting.    [provider]  polyethylene glycol (CLEARLAX) packet Take 17 g by mouth daily as needed for mild constipation.    [provider]  potassium chloride SA (K-DUR,KLOR-CON) 20 MEQ tablet Take 20 mEq by mouth daily.    [provider]  psyllium (METAMUCIL) 58.6 % packet Take 1 packet by mouth daily  with breakfast.    [provider]  Simethicone 125 MG TABS Take 1 tablet by mouth every 6 (six) hours as needed (for gas).    [provider]  traMADol (ULTRAM) 50 MG tablet Take 0.5 tablets (25 mg total) by mouth every 6 (six) hours as needed for severe pain. Patient taking differently: Take 50 mg by mouth every 6 (six) hours as needed for severe pain.  08/31/16   Rolly SalterPatel, Pranav M, MD    Physical Exam: Vitals:   04/10/18 1112 04/10/18 1341 04/10/18 1618 04/10/18 1700  BP: (!) 106/93 (!) 146/81    Pulse: 73 72  68  Resp: 18 18  12   Temp: 98.4 F (36.9 C)  97.9 F (36.6 C)   TempSrc: Oral  Rectal   SpO2: 98% 90%  100%    Constitutional: NAD, calm, comfortable Vitals:   04/10/18 1112 04/10/18 1341 04/10/18 1618 04/10/18 1700  BP: (!) 106/93 (!) 146/81    Pulse: 73 72  68  Resp: 18 18  12   Temp: 98.4 F (36.9 C)  97.9 F (36.6 C)   TempSrc: Oral  Rectal   SpO2: 98% 90%  100%   Eyes: Anicteric sclera ENMT: Edentulous, dry mucous membranes Neck: normal, supple Respiratory: No increased work of breathing, diminished lung sounds at bases, scattered rhonchi, no wheezes or crackles Cardiovascular: Distant heart sounds, regular rate and rhythm, no murmurs Abdomen: no tenderness, no masses palpated. No hepatosplenomegaly. Bowel sounds positive.  Musculoskeletal: 1+ lower extremity edema up to ankles Skin: no rashes over visible skin Neurologic: Grossly intact, moving all extremities but does not respond to commands Psychiatric: Unable to assess due to underlying dementia    Labs on Admission: I have personally reviewed following labs and imaging studies  CBC: Recent Labs  Lab 04/10/18 1404  WBC 10.7*  HGB 10.8*  HCT 36.7  MCV 105.8*  PLT 251   Basic Metabolic Panel: Recent Labs  Lab 04/10/18 1404  NA 138  K 4.6  CL 103  CO2 25  GLUCOSE 106*  BUN 27*  CREATININE 0.70  CALCIUM 9.0   GFR: CrCl cannot be calculated (Unknown ideal weight.). Liver  Function Tests: Recent Labs  Lab 04/10/18 1404  AST 15  ALT 12  ALKPHOS 48  BILITOT 0.6  PROT 6.4*  ALBUMIN 3.5   No results for input(s): LIPASE, AMYLASE in the last 168 hours. No results for input(s): AMMONIA in the last 168 hours. Coagulation Profile: No results for input(s): INR, PROTIME in the last 168 hours. Cardiac Enzymes: No results for input(s): CKTOTAL, CKMB, CKMBINDEX, TROPONINI in the last 168 hours. BNP (last 3 results) No results for input(s): PROBNP in the last 8760 hours. HbA1C: No results for input(s): HGBA1C in the last 72 hours. CBG: Recent Labs  Lab 04/10/18 1643  GLUCAP 97   Lipid Profile: No results for input(s): CHOL, HDL, LDLCALC, TRIG, CHOLHDL, LDLDIRECT in the last 72 hours. Thyroid Function Tests: No results for input(s): TSH, T4TOTAL, FREET4, T3FREE, THYROIDAB in the last 72 hours. Anemia Panel: No results for input(s): VITAMINB12, FOLATE, FERRITIN, TIBC, IRON, RETICCTPCT in the last 72 hours. Urine analysis:    Component Value Date/Time   COLORURINE YELLOW 03/31/2017 0226   APPEARANCEUR HAZY (A) 03/31/2017 0226   LABSPEC 1.013 03/31/2017 0226   PHURINE 6.0 03/31/2017 0226   GLUCOSEU NEGATIVE 03/31/2017 0226   HGBUR NEGATIVE 03/31/2017 0226   BILIRUBINUR NEGATIVE 03/31/2017 0226   KETONESUR NEGATIVE 03/31/2017 0226   PROTEINUR NEGATIVE 03/31/2017 0226   UROBILINOGEN 0.2 01/17/2014 1826   NITRITE NEGATIVE 03/31/2017 0226   LEUKOCYTESUR TRACE (A) 03/31/2017 0226    Radiological Exams on Admission: Dg Chest 2 View  Result Date: 04/10/2018 CLINICAL DATA:  85 year old female with lethargy and nonproductive cough. EXAM: CHEST - 2 VIEW COMPARISON:  Portable chest 03/31/2017 and earlier. FINDINGS: Semi upright AP and lateral views of the chest. Chronic elevation of the right hemidiaphragm. Increased linear, streaky and patchy right lower lung and perihilar opacity. No definite pleural effusion. The left lung appears stable and clear. Stable  cardiac size and mediastinal contours. No pneumothorax or pulmonary edema. Stable visualized osseous structures. Paucity of bowel gas in the upper abdomen. IMPRESSION: Chronically elevated right hemidiaphragm but new right lung base opacity suspicious for pneumonia. No definite pleural effusion. Electronically Signed  By: Odessa Fleming M.D.   On: 04/10/2018 16:14    EKG: Independently reviewed.  Sinus rhythm, no acute ST segment changes, unchanged from prior  Assessment/Plan Active Problems:   Osteoporosis   HTN (hypertension)   Hyperlipidemia   Acute respiratory failure with hypoxia (HCC)   Community acquired pneumonia    #) Acute hypoxic respiratory failure due to pneumonia: While patient does community memory care facility she does not appear to be chronically or significantly ill.  Suspect she most likely has community-acquired pneumonia.  Have low suspicion at this time for severity of healthcare acquired pneumonia. -Wean oxygen as tolerated - Continue IV ceftriaxone and azithromycin started 04/10/2018 -Follow-up blood cultures ordered 04/10/2018 -Follow-up influenza panel ordered 04/10/2018 -Gentle IV fluids as patient has not made urine yet  #) Fatigue/lethargy: Patient apparently has had difficulty transferring out of her wheelchair and has become increasingly tired and fatigued.  Was suspect this is most likely related to her pneumonia. -PT consult -Case management consult  #) Hypertension: -Continue carvedilol 3.125 mg twice daily -Continue losartan 100 mg daily  #) Pain/psych: -Continue alprazolam 0.5 mg twice daily -Continue bupropion 150 mg twice daily - Continue divalproex 250 mg nightly -Continue gabapentin 300 mg 4 times daily  #) Dementia: -Continue donepezil 10 mg daily  #) Osteoporosis: -Continue calcium and vitamin D supplementations  Fluids: Gentle IV fluids Electrolytes: Monitor and supplement Nutrition: Soft diet   Prophylaxis: Enoxaparin  Disposition:  Pending improvement of hypoxia and PT eval  DO NOT RESUSCITATE   Delaine Lame MD [facility]  If 7PM-7AM, please contact night-coverage [email or web address] Password [facility]  04/10/2018, 5:19 PM

## 2018-04-10 NOTE — ED Triage Notes (Signed)
Pt according to family has altered mental status. patient lives in a memory care unit. Decreased appetite, increased weakness. Family states last known well time was last week Weak cough. Patient became nauseous in triage.

## 2018-04-10 NOTE — ED Provider Notes (Signed)
[facility]-EMERGENCY DEPT Provider Note   CSN: [id number] Arrival date & time: 04/10/18  1052     History   Chief Complaint Chief Complaint  Patient presents with  . Altered Mental Status    HPI Nancy Meyer is a 85 y.o. female with history of anxiety and depression, dementia, HLD, HTN, discitis presenting for evaluation of gradual onset, progressively worsening generalized weakness for 3 to 4 days.  She lives in a memory care unit full-time and family notes that she has not been eating as much or ambulating as much independently.  They report that she is typically able to transfer from a bed to a chair but was unable to do so today.  She has had a nonproductive cough for the last week or so and her memory care facility began giving her Mucinex DM and her symptoms worsened after that.  They do not believe she has had a fever.  They also note decreased urine output.  They do report that she is at her mental status baseline.  The patient denies any headaches, chest pain, shortness of breath, abdominal pain, nausea, or vomiting.  The history is provided by the patient and a relative.    Past Medical History:  Diagnosis Date  . Anxiety and depression   . Arthritis   . Chronic pain   . Compression fracture of C-spine (HCC)   . Dementia (HCC)   . Hyperlipidemia   . Hypertension   . Macular degeneration   . Osteoporosis     Patient Active Problem List   Diagnosis Date Noted  . Traumatic compression fracture of T7 thoracic vertebra 08/29/2016  . Traumatic compression fracture of T11 thoracic vertebra (HCC) 08/29/2016  . Compression fracture of body of thoracic vertebra (HCC)   . Generalized abdominal pain   . Malnutrition of moderate degree 08/28/2016  . Acute respiratory failure with hypoxia (HCC) 08/27/2016  . Acute urinary retention 08/27/2016  . Rib fractures 01/17/2014  . Acute encephalopathy 01/17/2014  . Protein-calorie malnutrition, severe  (HCC) 12/15/2013  . Compression fracture of L1 lumbar vertebra (HCC) 12/15/2013  . Hypokalemia 12/15/2013  . Constipation 12/14/2013  . Nausea alone 12/04/2013  . Depression 11/06/2013  . Unspecified constipation 11/06/2013  . Compression fracture of L4 lumbar vertebra 11/05/2013  . Fracture of vertebra 11/03/2013  . Inguinal hernia, right. 08/09/2011  . Hyperglycemia 03/01/2011  . Seasonal allergies 03/01/2011  . Hyponatremia 02/28/2011  . Normocytic anemia 02/28/2011  . HTN (hypertension) 02/27/2011  . Diskitis 02/27/2011  . Cataract 02/27/2011  . Hyperlipidemia 02/27/2011  . Osteoporosis     Past Surgical History:  Procedure Laterality Date  . APPENDECTOMY    . BACK SURGERY     kyphoplasty  . EYE SURGERY    . IR RADIOLOGIST EVAL & MGMT  10/01/2016     OB History    Gravida  3   Para  3   Term  3   Preterm      AB      Living        SAB      TAB      Ectopic      Multiple      Live Births               Home Medications    Prior to Admission medications   Medication Sig Start Date End Date Taking? Authorizing Provider  acetaminophen (TYLENOL) 650 MG CR tablet Take 650-1,300 mg by mouth every  8 (eight) hours as needed for pain.     [provider]  ALPRAZolam Prudy Feeler) 0.25 MG tablet Take 0.25 mg by mouth 2 (two) times daily.    [provider]  buPROPion (WELLBUTRIN SR) 150 MG 12 hr tablet Take 150 mg by mouth 2 (two) times daily.     [provider]  calcium citrate-vitamin D (CITRACAL+D) 315-200 MG-UNIT tablet Take 1 tablet by mouth daily with breakfast.    [provider]  carvedilol (COREG) 3.125 MG tablet Take 3.125 mg by mouth 2 (two) times daily with a meal.    [provider]  cholecalciferol (VITAMIN D) 1000 units tablet Take 1,000 Units by mouth daily with breakfast.    [provider]  ciprofloxacin (CIPRO) 500 MG tablet Take 1 tablet (500 mg total) by mouth 2 (two) times  daily. Patient not taking: Reported on 12/01/2017 03/31/17   Dione Booze, MD  divalproex (DEPAKOTE SPRINKLE) 125 MG capsule Take 250 mg by mouth at bedtime.    [provider]  docusate sodium (COLACE) 100 MG capsule Take 200 mg by mouth 2 (two) times daily.     [provider]  donepezil (ARICEPT) 10 MG tablet Take 10 mg by mouth at bedtime.     [provider]  famotidine (PEPCID) 20 MG tablet Take 1 tablet (20 mg total) by mouth daily. 09/01/16   Rolly Salter, MD  feeding supplement (BOOST / RESOURCE BREEZE) LIQD Take 1 Container by mouth 3 (three) times daily between meals. Patient not taking: Reported on 12/01/2017 08/31/16   Rolly Salter, MD  furosemide (LASIX) 20 MG tablet Take 1 tablet (20 mg total) by mouth daily. Patient not taking: Reported on 12/01/2017 03/31/17   Dione Booze, MD  gabapentin (NEURONTIN) 300 MG capsule Take 300 mg by mouth 4 (four) times daily.    [provider]  loratadine (CLARITIN) 10 MG tablet Take 10 mg by mouth daily with supper.    [provider]  losartan (COZAAR) 100 MG tablet Take 100 mg by mouth daily with breakfast.  07/12/11   [provider]  Melatonin 3 MG TABS Take 3 mg by mouth at bedtime.     [provider]  Menthol, Topical Analgesic, (BIOFREEZE) 4 % GEL Apply 1 application topically 3 (three) times daily.    [provider]  metroNIDAZOLE (FLAGYL) 500 MG tablet Take 1 tablet (500 mg total) by mouth 3 (three) times daily. Patient not taking: Reported on 12/01/2017 03/31/17   Dione Booze, MD  Multiple Vitamins-Minerals (OCUVITE PRESERVISION PO) Take 1 tablet by mouth 2 (two) times daily.    [provider]  nystatin cream (MYCOSTATIN) Apply 1 application topically 3 (three) times daily.    [provider]  ondansetron (ZOFRAN) 4 MG tablet Take 4 mg by mouth every 8 (eight) hours as needed for nausea or vomiting.    [provider]  polyethylene glycol  (CLEARLAX) packet Take 17 g by mouth daily as needed for mild constipation.    [provider]  potassium chloride SA (K-DUR,KLOR-CON) 20 MEQ tablet Take 20 mEq by mouth daily.    [provider]  psyllium (METAMUCIL) 58.6 % packet Take 1 packet by mouth daily with breakfast.    [provider]  Simethicone 125 MG TABS Take 1 tablet by mouth every 6 (six) hours as needed (for gas).    [provider]  traMADol (ULTRAM) 50 MG tablet Take 0.5 tablets (25 mg total)  by mouth every 6 (six) hours as needed for severe pain. Patient taking differently: Take 50 mg by mouth every 6 (six) hours as needed for severe pain.  08/31/16   Rolly Salter, MD    Family History Family History  Problem Relation Age of Onset  . Cancer Daughter        breast    Social History Social History   Tobacco Use  . Smoking status: Never Smoker  . Smokeless tobacco: Never Used  Substance Use Topics  . Alcohol use: No  . Drug use: No     Allergies   Macrobid [nitrofurantoin]   Review of Systems Review of Systems  Constitutional: Negative for chills and fever.  Respiratory: Positive for cough. Negative for shortness of breath.   Cardiovascular: Negative for chest pain.  Gastrointestinal: Negative for abdominal pain, nausea and vomiting.  Neurological: Positive for weakness. Negative for syncope and headaches.  Psychiatric/Behavioral: Positive for confusion.  All other systems reviewed and are negative.    Physical Exam Updated Vital Signs BP (!) 146/81 (BP Location: Right Arm)   Pulse 72   Temp 97.9 F (36.6 C) (Rectal)   Resp 18   SpO2 90%   Physical Exam Vitals signs and nursing note reviewed.  Constitutional:      General: She is not in acute distress.    Appearance: She is well-developed.     Comments: Thin, laying in right lateral decubitus position. Appears disinterested.   HENT:     Head: Normocephalic and atraumatic.  Eyes:     General:         Right eye: No discharge.        Left eye: No discharge.     Extraocular Movements: Extraocular movements intact.     Conjunctiva/sclera: Conjunctivae normal.     Pupils: Pupils are equal, round, and reactive to light.  Neck:     Vascular: No JVD.     Trachea: No tracheal deviation.  Cardiovascular:     Rate and Rhythm: Normal rate.     Pulses: Normal pulses.  Pulmonary:     Breath sounds: Rales present.     Comments: Hypoxic to 86% on RA on initial assessment with improvement on 2L via [place]. Right sided crackles noted.  Frequent dry cough. Abdominal:     General: Abdomen is flat. Bowel sounds are normal. There is no distension.     Tenderness: There is no abdominal tenderness. There is no guarding or rebound.  Skin:    General: Skin is warm and dry.     Findings: No erythema.  Neurological:     Mental Status: She is alert.     Comments: Fluent speech with no evidence of dysarthria or aphasia.  No facial droop.  Sensation intact to soft touch of extremities.  Cranial nerves II through XII tested and intact.  Moves extremities spontaneously with intact strength.  Oriented to person and place but not time or events which family reports is her baseline.  Psychiatric:        Behavior: Behavior normal.      ED Treatments / Results  Labs (all labs ordered are listed, but only abnormal results are displayed) Labs Reviewed  COMPREHENSIVE METABOLIC PANEL - Abnormal; Notable for the following components:      Result Value   Glucose, Bld 106 (*)    BUN 27 (*)    Total Protein 6.4 (*)    All other components within normal limits  CBC - Abnormal;  Notable for the following components:   WBC 10.7 (*)    RBC 3.47 (*)    Hemoglobin 10.8 (*)    MCV 105.8 (*)    MCHC 29.4 (*)    All other components within normal limits  URINE CULTURE  URINALYSIS, ROUTINE W REFLEX MICROSCOPIC  INFLUENZA PANEL BY PCR (TYPE A & B)  CBG MONITORING, ED  I-STAT TROPONIN, ED  I-STAT CG4 LACTIC ACID, ED   I-STAT CG4 LACTIC ACID, ED    EKG EKG Interpretation  Date/Time:  Thursday April 10 2018 16:15:44 EST Ventricular Rate:  62 PR Interval:    QRS Duration: 85 QT Interval:  425 QTC Calculation: 432 R Axis:   87 Text Interpretation:  Sinus rhythm Probable left atrial enlargement Anteroseptal infarct, age indeterminate Confirmed by Virgina NorfolkAdam, Curat[place] [phone]([id number]) on 04/10/2018 4:18:09 PM   Radiology Dg Chest 2 View  Result Date: 04/10/2018 CLINICAL DATA:  85 year old female with lethargy and nonproductive cough. EXAM: CHEST - 2 VIEW COMPARISON:  Portable chest 03/31/2017 and earlier. FINDINGS: Semi upright AP and lateral views of the chest. Chronic elevation of the right hemidiaphragm. Increased linear, streaky and patchy right lower lung and perihilar opacity. No definite pleural effusion. The left lung appears stable and clear. Stable cardiac size and mediastinal contours. No pneumothorax or pulmonary edema. Stable visualized osseous structures. Paucity of bowel gas in the upper abdomen. IMPRESSION: Chronically elevated right hemidiaphragm but new right lung base opacity suspicious for pneumonia. No definite pleural effusion. Electronically Signed   By: Odessa FlemingH  Hall M.D.   On: 04/10/2018 16:14    Procedures .Critical Care Performed by: Jeanie SewerFawze, Marillyn Goren A, PA-C Authorized by: Jeanie SewerFawze, Tayah Idrovo A, PA-C   Critical care provider statement:    Critical care time (minutes):  45   Critical care was necessary to treat or prevent imminent or life-threatening deterioration of the following conditions:  Respiratory failure   Critical care was time spent personally by me on the following activities:  Discussions with consultants, evaluation of patient's response to treatment, examination of patient, ordering and performing treatments and interventions, ordering and review of laboratory studies, ordering and review of radiographic studies, pulse oximetry, re-evaluation of patient's condition, obtaining history from patient  or surrogate and review of old charts   I assumed direction of critical care for this patient from another provider in my specialty: no     (including critical care time)  Medications Ordered in ED Medications  sodium chloride 0.9 % bolus 500 mL (has no administration in time range)  cefTRIAXone (ROCEPHIN) 1 g in sodium chloride 0.9 % 100 mL IVPB (has no administration in time range)  azithromycin (ZITHROMAX) 500 mg in sodium chloride 0.9 % 250 mL IVPB (has no administration in time range)     Initial Impression / Assessment and Plan / ED Course  I have reviewed the triage vital signs and the nursing notes.  Pertinent labs & imaging results that were available during my care of the patient were reviewed by me and considered in my medical decision making (see chart for details).      Patient presenting brought in by daughter and son-in-law for evaluation of progressively worsening generalized weakness, cough, decreased urine output, decreased oral intake.  She is afebrile, initially hypoxic on my assessment with SPO2 saturations 86% on room air with improvement on 2 L via nasal cannula.  She was able to be weaned off shortly but with any attempts to move or speak her O2 saturations dropped and so she  was placed on supplemental oxygen once again.  Lab work reviewed by me shows mild leukocytosis, no metabolic derangements.  No renal insufficiency.  Lactate is negative.  She does not appear to be septic at this time.  Chest x-ray significant for new right lung base opacity suggestive of pneumonia given her symptoms.  She was given IV antibiotics in the ED.  Low suspicion of acute intracranial abnormality given non-focal and reassuring neurologic examination.  Awaiting UA to assess for possible superimposed UTI.  She will require admission for her pneumonia with new oxygen requirement.  Tried hospitalist service to admit.  Patient seen and evaluate Dr. Lockie Molauratolo who agrees with assessment and plan at  this time. Final Clinical Impressions(s) / ED Diagnoses   Final diagnoses:  Hypoxia  Pneumonia of right lower lobe due to infectious organism [facility](HCC)    ED Discharge Orders    None       Jeanie SewerFawze, Chaka Jefferys A, PA-C 04/10/18 1708    Virgina NorfolkCuratolo, Adam, DO 04/10/18 2011

## 2018-04-10 NOTE — ED Provider Notes (Signed)
Medical screening examination/treatment/procedure(s) were conducted as a shared visit with non-physician practitioner(s) and myself.  I personally evaluated the patient during the encounter. Briefly, the patient is a 85 y.o. female with history of dementia who presents to the ED with cough, shortness of breath, mental status change.  Patient with unremarkable vitals.  No fever.  Has some mild hypoxia with O2 sats in the low 90s but is on room air.  Has had viral type symptoms, has been on Mucinex.  Unknown if has had a fever.  No abdominal pain, no chest pain.  Overall is alert, neurologically intact.  Possible pneumonia versus urinary tract infection.  Possibly medication side effect.  We will get labs including chest x-ray, influenza testing, urinary tract infection. No recent falls or trauma.  Will reevaluate.  Patient with signs concerning for right lower lobe pneumonia on exam and on chest x-ray.  Patient mild leukocytosis.  New oxygen requirement and on 2 L.  Has had a cough.  Suspect pneumonia.  Will treat with IV antibiotics.  Otherwise no signs to suggest sepsis.  Normal lactic acid, normal blood pressure.  EKG unremarkable.  Troponin within normal limits.  Will admit the patient for further IV antibiotics.  This chart was dictated using voice recognition software.  Despite best efforts to proofread,  errors can occur which can change the documentation meaning.    EKG Interpretation  Date/Time:  Thursday April 10 2018 16:15:44 EST Ventricular Rate:  62 PR Interval:    QRS Duration: 85 QT Interval:  425 QTC Calculation: 432 R Axis:   87 Text Interpretation:  Sinus rhythm Probable left atrial enlargement Anteroseptal infarct, age indeterminate Confirmed by Virgina Norfolk [phone] on 04/10/2018 4:18:09 PM           Virgina Norfolk, DO 04/10/18 1713

## 2018-04-11 DIAGNOSIS — J9601 Acute respiratory failure with hypoxia: Secondary | ICD-10-CM | POA: Diagnosis present

## 2018-04-11 DIAGNOSIS — F419 Anxiety disorder, unspecified: Secondary | ICD-10-CM | POA: Diagnosis present

## 2018-04-11 DIAGNOSIS — G894 Chronic pain syndrome: Secondary | ICD-10-CM | POA: Diagnosis present

## 2018-04-11 DIAGNOSIS — Z681 Body mass index (BMI) 19 or less, adult: Secondary | ICD-10-CM | POA: Diagnosis not present

## 2018-04-11 DIAGNOSIS — E785 Hyperlipidemia, unspecified: Secondary | ICD-10-CM | POA: Diagnosis present

## 2018-04-11 DIAGNOSIS — R451 Restlessness and agitation: Secondary | ICD-10-CM | POA: Diagnosis not present

## 2018-04-11 DIAGNOSIS — M1990 Unspecified osteoarthritis, unspecified site: Secondary | ICD-10-CM | POA: Diagnosis present

## 2018-04-11 DIAGNOSIS — F0390 Unspecified dementia without behavioral disturbance: Secondary | ICD-10-CM | POA: Diagnosis present

## 2018-04-11 DIAGNOSIS — R0902 Hypoxemia: Secondary | ICD-10-CM | POA: Diagnosis present

## 2018-04-11 DIAGNOSIS — I160 Hypertensive urgency: Secondary | ICD-10-CM | POA: Diagnosis present

## 2018-04-11 DIAGNOSIS — Z79899 Other long term (current) drug therapy: Secondary | ICD-10-CM | POA: Diagnosis not present

## 2018-04-11 DIAGNOSIS — F329 Major depressive disorder, single episode, unspecified: Secondary | ICD-10-CM | POA: Diagnosis present

## 2018-04-11 DIAGNOSIS — H3530 Unspecified macular degeneration: Secondary | ICD-10-CM | POA: Diagnosis present

## 2018-04-11 DIAGNOSIS — E43 Unspecified severe protein-calorie malnutrition: Secondary | ICD-10-CM | POA: Diagnosis present

## 2018-04-11 DIAGNOSIS — Z66 Do not resuscitate: Secondary | ICD-10-CM | POA: Diagnosis present

## 2018-04-11 DIAGNOSIS — I10 Essential (primary) hypertension: Secondary | ICD-10-CM | POA: Diagnosis present

## 2018-04-11 DIAGNOSIS — Z888 Allergy status to other drugs, medicaments and biological substances status: Secondary | ICD-10-CM | POA: Diagnosis not present

## 2018-04-11 DIAGNOSIS — N390 Urinary tract infection, site not specified: Secondary | ICD-10-CM | POA: Diagnosis present

## 2018-04-11 DIAGNOSIS — B952 Enterococcus as the cause of diseases classified elsewhere: Secondary | ICD-10-CM | POA: Diagnosis present

## 2018-04-11 DIAGNOSIS — J189 Pneumonia, unspecified organism: Secondary | ICD-10-CM | POA: Diagnosis present

## 2018-04-11 DIAGNOSIS — M810 Age-related osteoporosis without current pathological fracture: Secondary | ICD-10-CM | POA: Diagnosis present

## 2018-04-11 LAB — CBC
HCT: 31.3 % — ABNORMAL LOW (ref 36.0–46.0)
Hemoglobin: 9.3 g/dL — ABNORMAL LOW (ref 12.0–15.0)
MCH: 31.1 pg (ref 26.0–34.0)
MCHC: 29.7 g/dL — ABNORMAL LOW (ref 30.0–36.0)
MCV: 104.7 fL — ABNORMAL HIGH (ref 80.0–100.0)
Platelets: 222 10*3/uL (ref 150–400)
RBC: 2.99 MIL/uL — ABNORMAL LOW (ref 3.87–5.11)
RDW: 13.8 % (ref 11.5–15.5)
WBC: 7.9 10*3/uL (ref 4.0–10.5)
nRBC: 0 % (ref 0.0–0.2)

## 2018-04-11 LAB — BASIC METABOLIC PANEL
Anion gap: 9 (ref 5–15)
BUN: 22 mg/dL (ref 8–23)
CO2: 26 mmol/L (ref 22–32)
Calcium: 8.3 mg/dL — ABNORMAL LOW (ref 8.9–10.3)
Chloride: 104 mmol/L (ref 98–111)
GFR calc non Af Amer: >60 mL/min (ref >60)
Glucose, Bld: 108 mg/dL — ABNORMAL HIGH (ref 70–99)
Potassium: 3.7 mmol/L (ref 3.5–5.1)

## 2018-04-11 LAB — BASIC METABOLIC PANEL WITH GFR
Creatinine, Ser: 0.58 mg/dL (ref 0.44–1.00)
GFR calc Af Amer: >60 mL/min (ref >60)
Sodium: 139 mmol/L (ref 135–145)

## 2018-04-11 LAB — PROCALCITONIN: Procalcitonin: <0.1 ng/mL

## 2018-04-11 LAB — MRSA PCR SCREENING: MRSA by PCR: NEGATIVE

## 2018-04-11 MED ORDER — ORAL CARE MOUTH RINSE
15.0000 mL | Freq: Two times a day (BID) | OROMUCOSAL | Status: DC
  Administered 2018-04-11 – 2018-04-14 (×5): 15 mL via OROMUCOSAL

## 2018-04-11 MED ORDER — ADULT MULTIVITAMIN W/MINERALS CH
1.0000 | ORAL_TABLET | Freq: Every day | ORAL | Status: DC
  Administered 2018-04-11 – 2018-04-14 (×4): 1 via ORAL
  Filled 2018-04-11 (×4): qty 1

## 2018-04-11 MED ORDER — LIP MEDEX EX OINT
TOPICAL_OINTMENT | CUTANEOUS | Status: AC
  Filled 2018-04-11: qty 7

## 2018-04-11 NOTE — Clinical Social Work Note (Signed)
Clinical Social Work Assessment  Patient Details  Name: Nancy JewelBarbara P Spagnoli MRN: [id number] Date of Birth: Sep 19, 1920  Date of referral:  04/11/18               Reason for consult:  (facility resident)                Permission sought to share information with:  Family Supports Permission granted to share information::     Name::     son Kathlene NovemberMike [phone]  Agency::  [facility]  Relationship::     Contact Information:     Housing/Transportation Living arrangements for the past 2 months:  Assisted Living Facility Source of Information:  Facility, Adult Children Patient Interpreter Needed:  None Criminal Activity/Legal Involvement Pertinent to Current Situation/Hospitalization:  No - Comment as needed Significant Relationships:  Adult Children, Community Support Lives with:  Facility Resident Do you feel safe going back to the place where you live?  Yes Need for family participation in patient care:  Yes (Comment)(pt with dementia)  Care giving concerns:  Pt admitted from memory care ALF. She has resided there for about 2 years, in past few months moved from assisted living section to memory care unit has her cognition has declined. At baseline needs assistance/prompting with ADLs and ambulating. Admitted for pneumonia   Social Worker assessment / plan:  CSW consulted to assist with disposition as pt is resident of facility- [facility] memory [facility]. Spoke with facility and with son, both aware of pt's status. Anticipating pt returning to facility at DC unless level of care needs change. Facility reports pt does not currently have any HH needs or therapy at facility. Will need FL2 and communication with facility at DC. CSWs will follow to assist (pt confused, did not engage with pt)  Employment status:  Retired Health and safety inspectornsurance information:  Managed Care(healthte[employer] advantage) PT Recommendations:  Not assessed at this time Information / Referral to community resources:      Patient/Family's Response to care:  Appreciative, detail oriented  Patient/Family's Understanding of and Emotional Response to Diagnosis, Current Treatment, and Prognosis:   Pt confused consistent with cognitive impairment issues. Son showed good understanding and asked pertinent questions about pt's plan of treatment and potential DC needs.  Emotional Assessment Appearance:    Attitude/Demeanor/Rapport:    Affect (typically observed):    Orientation:    Alcohol / Substance use:    Psych involvement (Current and /or in the community):  No (Comment)  Discharge Needs  Concerns to be addressed:  Care Coordination Readmission within the last 30 days:  No Current discharge risk:  None Barriers to Discharge:  Continued Medical Work up   Terex CorporationMeghan R Deairra Halleck, LCSW 04/11/2018, 10:06 AM [phone]

## 2018-04-11 NOTE — Progress Notes (Signed)
Initial Nutrition Assessment  DOCUMENTATION CODES:   Non-severe (moderate) malnutrition in context of social or environmental circumstances  INTERVENTION:    Ensure Enlive po BID, each supplement provides 350 kcal and 20 grams of protein  Magic cup TID with meals, each supplement provides 290 kcal and 9 grams of protein  MVI daily  NUTRITION DIAGNOSIS:   Moderate Malnutrition related to social / environmental circumstances(dementia) as evidenced by moderate fat depletion, moderate muscle depletion.  GOAL:   Patient will meet greater than or equal to 90% of their needs  MONITOR:   PO intake, Labs, Supplement acceptance, Weight trends  REASON FOR ASSESSMENT:   Malnutrition Screening Tool    ASSESSMENT:   Patient with PMH significant for HTN, depression/anxiety, dementia, osteoporosis, chronic lower extremity edema, and chronic pain. Presents from memory care unit with 1 week of increasing lethargy, difficulty transferring, slight cough and found to have likely CAP.    Pt unable to provide history. Spoke with RN who reports pt ate a small amount of grits and drank a small amount of Ensure this am. No meal completions charted. RN unsure of how pt ate PTA. Will try to obtain more history from family is possible.   Weight noted to increase from 90 lb on 06/04/17 to 106 lb this admission. Nutrition-Focused physical exam completed.  Medications reviewed and include: calcium-vit D, Vit D, colace, metamucil, NS @ 50 ml/hr Labs reviewed.   NUTRITION - FOCUSED PHYSICAL EXAM:    Most Recent Value  Orbital Region  Mild depletion  Upper Arm Region  Moderate depletion  Thoracic and Lumbar Region  Unable to assess  Buccal Region  Moderate depletion  Temple Region  Moderate depletion  Clavicle Bone Region  Severe depletion  Clavicle and Acromion Bone Region  Moderate depletion  Scapular Bone Region  Unable to assess  Dorsal Hand  Mild depletion  Patellar Region  Moderate depletion   Anterior Thigh Region  Moderate depletion  Posterior Calf Region  Moderate depletion  Edema (RD Assessment)  None     Diet Order:   Diet Order            DIET SOFT Room service appropriate? Yes; Fluid consistency: Thin  Diet effective now              EDUCATION NEEDS:   Not appropriate for education at this time  Skin:  Skin Assessment: Reviewed RN Assessment  Last BM:  1/16  Height:   Ht Readings from Last 1 Encounters:  04/10/18 5' (1.524 m)    Weight:   Wt Readings from Last 1 Encounters:  04/10/18 48 kg    Ideal Body Weight:  45.5 kg  BMI:  Body mass index is 20.67 kg/m.  Estimated Nutritional Needs:   Kcal:  1400-1600 kcal  Protein:  70-85 grams  Fluid:  >/= 1.4 L/day   Vanessa Kick RD, LDN Clinical Nutrition Pager # [phone]

## 2018-04-11 NOTE — Progress Notes (Signed)
PROGRESS NOTE    Nancy Meyer  RJJ:[id number] DOB: [birth year] DOA: 04/10/2018 PCP: Kaleen Mask, MD   Brief Narrative:  85 y.o. female with medical history significant of hypertension, depression/anxiety, dementia, osteoporosis, chronic lower extremity edema, chronic pain who presented from memory care facility with 1 week of increasing lethargy, difficulty transferring, slight cough and found to have likely community-acquired pneumonia.    Assessment & Plan:   Active Problems:   Osteoporosis   HTN (hypertension)   Hyperlipidemia   Acute respiratory failure with hypoxia (HCC)   Community acquired pneumonia   CAP (community acquired pneumonia)   #) Acute hypoxic respiratory failure due to pneumonia: Patient continues to be hypoxic with removal of nasal cannula.  She is also a little bit tachypneic. -Wean oxygen as tolerated - Continue IV ceftriaxone and azithromycin started 04/10/2018 - blood cultures ordered 04/10/2018 no growth - influenza panel ordered 04/10/2018 negative  #) Fatigue/lethargy: This continues to persist though patient has not seen PT yet. -PT consult -Case management consult  #) Hypertension: -Continue carvedilol 3.125 mg twice daily -Continue losartan 100 mg daily  #) Pain/psych: -Continue alprazolam 0.5 mg twice daily -Continue bupropion 150 mg twice daily - Continue divalproex 250 mg nightly -Continue gabapentin 300 mg 4 times daily  #) Dementia: -Continue donepezil 10 mg daily  #) Osteoporosis: -Continue calcium and vitamin D supplementations  Fluids: Tolerating p.o. Electrolytes: Monitor and supplement Nutrition: Soft diet   Prophylaxis: Enoxaparin  Disposition: Pending improvement of hypoxia and PT eval  DO NOT RESUSCITATE    Consultants:   None  Procedures:   None  Antimicrobials:   IV ceftriaxone and azithromycin started 04/10/2018   Subjective: This morning the patient does not have any  complaints but she does report that she is in pain but she cannot localize where.  She denies any nausea, vomiting, diarrhea.  She does not have any shortness of breath.  Per discussion with the patient's bedside nurse she apparently became agitated and confused last night and required haloperidol and a sitter.  Unfortunately the patient persistently desaturates when her nasal cannula was removed.  Objective: Vitals:   04/10/18 1851 04/10/18 1857 04/10/18 2303 04/10/18 2311  BP: (!) 153/63 (!) 153/63 (!) 153/70   Pulse: (!) 58 (!) 58 67   Resp: 18 18 20    Temp: 97.7 F (36.5 C) 97.7 F (36.5 C) 97.8 F (36.6 C)   TempSrc: Oral Oral Oral   SpO2: 100% 100% (!) 89% 92%  Weight:  48 kg    Height:  5' (1.524 m)      Intake/Output Summary (Last 24 hours) at 04/11/2018 1031 Last data filed at 04/11/2018 0300 Gross per 24 hour  Intake 330.7 ml  Output -  Net 330.7 ml   Filed Weights   04/10/18 1618 04/10/18 1857  Weight: 48 kg 48 kg    Examination:  General exam: Appears calm and comfortable  Respiratory system: Mildly increased work of breathing, diminished lung sounds at bases, scattered rhonchi, no wheezes or crackles Cardiovascular system: Distant heart sounds, regular rate and rhythm, no murmurs Gastrointestinal system: Soft, nondistended, no rebound or guarding, plus bowel sounds. Central nervous system: Alert but not oriented, grossly intact, moving all extremities Extremities: 1+ edema up to ankles Skin: No rashes over visible skin Psychiatry: Unable to assess due to underlying dementia    Data Reviewed: I have personally reviewed following labs and imaging studies  CBC: Recent Labs  Lab 04/10/18 1404 04/11/18 0315  WBC 10.7*  7.9  HGB 10.8* 9.3*  HCT 36.7 31.3*  MCV 105.8* 104.7*  PLT 251 222   Basic Metabolic Panel: Recent Labs  Lab 04/10/18 1404 04/11/18 0315  NA 138 139  K 4.6 3.7  CL 103 104  CO2 25 26  GLUCOSE 106* 108*  BUN 27* 22  CREATININE  0.70 0.58  CALCIUM 9.0 8.3*   GFR: Estimated Creatinine Clearance: 30.2 mL/min (by C-G formula based on SCr of 0.58 mg/dL). Liver Function Tests: Recent Labs  Lab 04/10/18 1404  AST 15  ALT 12  ALKPHOS 48  BILITOT 0.6  PROT 6.4*  ALBUMIN 3.5   No results for input(s): LIPASE, AMYLASE in the last 168 hours. No results for input(s): AMMONIA in the last 168 hours. Coagulation Profile: No results for input(s): INR, PROTIME in the last 168 hours. Cardiac Enzymes: No results for input(s): CKTOTAL, CKMB, CKMBINDEX, TROPONINI in the last 168 hours. BNP (last 3 results) No results for input(s): PROBNP in the last 8760 hours. HbA1C: No results for input(s): HGBA1C in the last 72 hours. CBG: Recent Labs  Lab 04/10/18 1643  GLUCAP 97   Lipid Profile: No results for input(s): CHOL, HDL, LDLCALC, TRIG, CHOLHDL, LDLDIRECT in the last 72 hours. Thyroid Function Tests: No results for input(s): TSH, T4TOTAL, FREET4, T3FREE, THYROIDAB in the last 72 hours. Anemia Panel: No results for input(s): VITAMINB12, FOLATE, FERRITIN, TIBC, IRON, RETICCTPCT in the last 72 hours. Sepsis Labs: Recent Labs  Lab 04/10/18 1637 04/10/18 2103 04/11/18 0315  PROCALCITON  --  <0.10 <0.10  LATICACIDVEN 0.84  --   --     Recent Results (from the past 240 hour(s))  MRSA PCR Screening     Status: None   Collection Time: 04/11/18  5:56 AM  Result Value Ref Range Status   MRSA by PCR NEGATIVE NEGATIVE Final    Comment:        The GeneXpert MRSA Assay (FDA approved for NASAL specimens only), is one component of a comprehensive MRSA colonization surveillance program. It is not intended to diagnose MRSA infection nor to guide or monitor treatment for MRSA infections. Performed at [facility], [address] [place], [place] [id number]          Radiology Studies: Dg Chest 2 View  Result Date: 04/10/2018 CLINICAL DATA:  85 year old female with lethargy and nonproductive  cough. EXAM: CHEST - 2 VIEW COMPARISON:  Portable chest 03/31/2017 and earlier. FINDINGS: Semi upright AP and lateral views of the chest. Chronic elevation of the right hemidiaphragm. Increased linear, streaky and patchy right lower lung and perihilar opacity. No definite pleural effusion. The left lung appears stable and clear. Stable cardiac size and mediastinal contours. No pneumothorax or pulmonary edema. Stable visualized osseous structures. Paucity of bowel gas in the upper abdomen. IMPRESSION: Chronically elevated right hemidiaphragm but new right lung base opacity suspicious for pneumonia. No definite pleural effusion. Electronically Signed   By: Odessa FlemingH  Hall M.D.   On: 04/10/2018 16:14        Scheduled Meds: . ALPRAZolam  0.25 mg Oral BID  . azithromycin  250 mg Oral Daily  . buPROPion  300 mg Oral Daily  . calcium-vitamin D  1 tablet Oral Q breakfast  . carvedilol  3.125 mg Oral BID  . cholecalciferol  1,000 Units Oral Q breakfast  . divalproex  125 mg Oral Q24H  . divalproex  250 mg Oral BID  . docusate sodium  200 mg Oral BID  . donepezil  10 mg Oral QHS  . enoxaparin (LOVENOX) injection  30 mg Subcutaneous Q24H  . famotidine  20 mg Oral Daily  . feeding supplement (ENSURE ENLIVE)  237 mL Oral BID BM  . gabapentin  300 mg Oral Q24H  . gabapentin  600 mg Oral BID  . loratadine  10 mg Oral Q supper  . losartan  100 mg Oral Q breakfast  . mouth rinse  15 mL Mouth Rinse BID  . Melatonin  3 mg Oral QHS  . MUSCLE RUB   Topical TID  . nystatin cream  1 application Topical TID  . psyllium  1 packet Oral BID   Continuous Infusions: . sodium chloride 50 mL/hr at 04/11/18 0300  . cefTRIAXone (ROCEPHIN)  IV       LOS: 0 days    Time spent: 35    Delaine LameShrey C Brylea Pita, MD [facility]  If 7PM-7AM, please contact night-coverage [email or web address] Password [email or web address] 04/11/2018, 10:31 AM

## 2018-04-12 DIAGNOSIS — G894 Chronic pain syndrome: Secondary | ICD-10-CM | POA: Diagnosis present

## 2018-04-12 DIAGNOSIS — R531 Weakness: Secondary | ICD-10-CM

## 2018-04-12 DIAGNOSIS — F03C0 Unspecified dementia, severe, without behavioral disturbance, psychotic disturbance, mood disturbance, and anxiety: Secondary | ICD-10-CM | POA: Diagnosis present

## 2018-04-12 DIAGNOSIS — I10 Essential (primary) hypertension: Secondary | ICD-10-CM

## 2018-04-12 DIAGNOSIS — E43 Unspecified severe protein-calorie malnutrition: Secondary | ICD-10-CM

## 2018-04-12 DIAGNOSIS — F0390 Unspecified dementia without behavioral disturbance: Secondary | ICD-10-CM

## 2018-04-12 LAB — PROCALCITONIN: Procalcitonin: <0.1 ng/mL

## 2018-04-12 MED ORDER — BUPROPION HCL ER (XL) 300 MG PO TB24
300.0000 mg | ORAL_TABLET | Freq: Every day | ORAL | Status: DC
  Administered 2018-04-13 – 2018-04-14 (×2): 300 mg via ORAL
  Filled 2018-04-12 (×3): qty 1

## 2018-04-12 MED ORDER — SENNA 8.6 MG PO TABS: 1.0000 | ORAL_TABLET | Freq: Two times a day (BID) | ORAL | Status: DC | PRN

## 2018-04-12 MED ORDER — SACCHAROMYCES BOULARDII 250 MG PO CAPS
250.0000 mg | ORAL_CAPSULE | Freq: Every day | ORAL | Status: DC
  Administered 2018-04-12 – 2018-04-14 (×3): 250 mg via ORAL
  Filled 2018-04-12 (×3): qty 1

## 2018-04-12 MED ORDER — DOCUSATE SODIUM 50 MG PO CAPS
250.0000 mg | ORAL_CAPSULE | Freq: Every day | ORAL | Status: DC
  Administered 2018-04-12 – 2018-04-13 (×2): 250 mg via ORAL
  Filled 2018-04-12 (×3): qty 1

## 2018-04-12 MED ORDER — SODIUM CHLORIDE 0.9% FLUSH: 10.0000 mL | INTRAVENOUS | Status: DC | PRN

## 2018-04-12 MED ORDER — HALOPERIDOL LACTATE 5 MG/ML IJ SOLN
2.0000 mg | Freq: Once | INTRAMUSCULAR | Status: AC
  Administered 2018-04-12: 2 mg via INTRAVENOUS
  Filled 2018-04-12: qty 1

## 2018-04-12 NOTE — Progress Notes (Signed)
PROGRESS NOTE    Nancy Meyer  BSJ:[id number] DOB: 07-10-1920 DOA: 04/10/2018 PCP: Kaleen Mask, MD   Brief Narrative:  85 y.o. female with medical history significant of hypertension, depression/anxiety, advanced dementia, osteoporosis, chronic lower extremity edema, chronic pain syndrome, severe malnutrition, who presented from memory care facility with 1 week of increasing lethargy, difficulty transferring, slight cough and found to have hypoxia with community-acquired pneumonia.    Assessment & Plan:   Principal Problem:   Acute respiratory failure with hypoxia (HCC) Active Problems:   Osteoporosis   HTN (hypertension)   Protein-calorie malnutrition, severe (HCC)   CAP (community acquired pneumonia)   Chronic pain syndrome   Weakness generalized   Advanced dementia (HCC)   #) Acute hypoxic respiratory failure due to pneumonia:  -Wean oxygen as tolerated, to keep goal O2 sat >=92% - Continue IV ceftriaxone and azithromycin started 04/10/2018 (day #2) - blood cultures ordered 04/10/2018 no growth - influenza panel ordered 04/10/2018 negative - resume probiotics  #) generalized weakness:  -PT/OT consult -Case management consult  #) Hypertension: HTN urgency on admission resolved -Continue carvedilol 3.125 mg twice daily -Continue losartan 100 mg daily  #) Pain/psych: -Continue alprazolam 0.5 mg twice daily -Continue bupropion 150 mg twice daily - Continue divalproex 250 mg nightly -Continue gabapentin 300 mg 4 times daily  #) Advanced dementia: -Continue donepezil 10 mg daily  #) Osteoporosis: -Continue calcium and vitamin D supplementations  #) severe malnutrition - nutrition consult for supplement  Fluids: Tolerating p.o. Electrolytes: Monitor and supplement Nutrition: Soft diet   Prophylaxis: Enoxaparin  Disposition: Pending improvement of hypoxia and PT eval  DO NOT RESUSCITATE    Consultants:   nutrition  Procedures:     None  Antimicrobials:   IV ceftriaxone and azithromycin started 04/10/2018   Subjective: This morning the patient does not have any complaints but she does report that she is in pain but she cannot localize where.  She's demented and scream out intermittently, and her agitation will instantly improve when her son arrives  She does not have any shortness of breath. She's afebrile but still requires 2L [place]. BP normalized today.   Objective: Vitals:   04/11/18 2247 04/11/18 2251 04/11/18 2317 04/12/18 0259  BP: (!) 181/107 (!) 193/115 (!) 169/97 (!) 124/58  Pulse: 93 (!) 103 93 61  Resp:  15  20  Temp:    97.9 F (36.6 C)  TempSrc:    Axillary  SpO2:  98%  95%  Weight:      Height:        Intake/Output Summary (Last 24 hours) at 04/12/2018 1029 Last data filed at 04/12/2018 7654 Gross per 24 hour  Intake 460 ml  Output 100 ml  Net 360 ml   Filed Weights   04/10/18 1618 04/10/18 1857  Weight: 48 kg 48 kg    Examination:  General exam: Appears calm and comfortable; advanced age. Respiratory system: no respiratory distress, diminished lung sounds at bases, scattered rhonchi, no wheezes or crackles Cardiovascular system: Distant heart sounds, regular rate and rhythm, no murmurs Gastrointestinal system: Soft, nondistended, no rebound or guarding, plus bowel sounds. Central nervous system: Alert but not oriented, grossly intact, moving all extremities Extremities: 1+ edema up to ankles Skin: No rashes over visible skin Psychiatry: Unable to assess due to underlying dementia    Data Reviewed: I have personally reviewed following labs and imaging studies  CBC: Recent Labs  Lab 04/10/18 1404 04/11/18 0315  WBC 10.7* 7.9  HGB  10.8* 9.3*  HCT 36.7 31.3*  MCV 105.8* 104.7*  PLT 251 222   Basic Metabolic Panel: Recent Labs  Lab 04/10/18 1404 04/11/18 0315  NA 138 139  K 4.6 3.7  CL 103 104  CO2 25 26  GLUCOSE 106* 108*  BUN 27* 22  CREATININE 0.70 0.58   CALCIUM 9.0 8.3*   GFR: Estimated Creatinine Clearance: 30.2 mL/min (by C-G formula based on SCr of 0.58 mg/dL). Liver Function Tests: Recent Labs  Lab 04/10/18 1404  AST 15  ALT 12  ALKPHOS 48  BILITOT 0.6  PROT 6.4*  ALBUMIN 3.5   No results for input(s): LIPASE, AMYLASE in the last 168 hours. No results for input(s): AMMONIA in the last 168 hours. Coagulation Profile: No results for input(s): INR, PROTIME in the last 168 hours. Cardiac Enzymes: No results for input(s): CKTOTAL, CKMB, CKMBINDEX, TROPONINI in the last 168 hours. BNP (last 3 results) No results for input(s): PROBNP in the last 8760 hours. HbA1C: No results for input(s): HGBA1C in the last 72 hours. CBG: Recent Labs  Lab 04/10/18 1643  GLUCAP 97   Lipid Profile: No results for input(s): CHOL, HDL, LDLCALC, TRIG, CHOLHDL, LDLDIRECT in the last 72 hours. Thyroid Function Tests: No results for input(s): TSH, T4TOTAL, FREET4, T3FREE, THYROIDAB in the last 72 hours. Anemia Panel: No results for input(s): VITAMINB12, FOLATE, FERRITIN, TIBC, IRON, RETICCTPCT in the last 72 hours. Sepsis Labs: Recent Labs  Lab 04/10/18 1637 04/10/18 2103 04/11/18 0315 04/12/18 0257  PROCALCITON  --  <0.10 <0.10 <0.10  LATICACIDVEN 0.84  --   --   --     Recent Results (from the past 240 hour(s))  Urine culture     Status: Abnormal (Preliminary result)   Collection Time: 04/10/18  3:29 PM  Result Value Ref Range Status   Specimen Description   Final    URINE, CLEAN CATCH Performed at [facility], [address], [place], [place] [address]    Special Requests   Final    NONE Performed at [facility], [address], [place], [place] [address]    Culture 60,000 COLONIES/mL ENTEROCOCCUS FAECALIS (A)  Final   Report Status PENDING  Incomplete  MRSA PCR Screening     Status: None   Collection Time: 04/11/18  5:56 AM  Result Value Ref Range Status   MRSA by PCR NEGATIVE  NEGATIVE Final    Comment:        The GeneXpert MRSA Assay (FDA approved for NASAL specimens only), is one component of a comprehensive MRSA colonization surveillance program. It is not intended to diagnose MRSA infection nor to guide or monitor treatment for MRSA infections. Performed at [facility], [address], [place], [place] [address]          Radiology Studies: Dg Chest 2 View  Result Date: 04/10/2018 CLINICAL DATA:  85 year old female with lethargy and nonproductive cough. EXAM: CHEST - 2 VIEW COMPARISON:  Portable chest 03/31/2017 and earlier. FINDINGS: Semi upright AP and lateral views of the chest. Chronic elevation of the right hemidiaphragm. Increased linear, streaky and patchy right lower lung and perihilar opacity. No definite pleural effusion. The left lung appears stable and clear. Stable cardiac size and mediastinal contours. No pneumothorax or pulmonary edema. Stable visualized osseous structures. Paucity of bowel gas in the upper abdomen. IMPRESSION: Chronically elevated right hemidiaphragm but new right lung base opacity suspicious for pneumonia. No definite pleural effusion. Electronically Signed   By:  Odessa FlemingH  Hall M.D.   On: 04/10/2018 16:14        Scheduled Meds: . ALPRAZolam  0.25 mg Oral BID  . azithromycin  250 mg Oral Daily  . buPROPion  300 mg Oral Daily  . buPROPion  300 mg Oral Daily  . calcium-vitamin D  1 tablet Oral Q breakfast  . carvedilol  3.125 mg Oral BID  . cholecalciferol  1,000 Units Oral Q breakfast  . divalproex  125 mg Oral Q24H  . divalproex  250 mg Oral BID  . docusate sodium  200 mg Oral BID  . docusate sodium  250 mg Oral Daily  . donepezil  10 mg Oral QHS  . enoxaparin (LOVENOX) injection  30 mg Subcutaneous Q24H  . famotidine  20 mg Oral Daily  . feeding supplement (ENSURE ENLIVE)  237 mL Oral BID BM  . gabapentin  300 mg Oral Q24H  . gabapentin  600 mg Oral BID  . loratadine  10 mg Oral Q supper  .  losartan  100 mg Oral Q breakfast  . mouth rinse  15 mL Mouth Rinse BID  . Melatonin  3 mg Oral QHS  . multivitamin with minerals  1 tablet Oral Daily  . MUSCLE RUB   Topical TID  . nystatin cream  1 application Topical TID  . Probiotic   Oral Daily  . psyllium  1 packet Oral BID   Continuous Infusions: . cefTRIAXone (ROCEPHIN)  IV Stopped (04/11/18 1842)     LOS: 1 day    Time spent: 7535    Gala MurdochJoyce S Leontina Skidmore, MD [facility]  If 7PM-7AM, please contact night-coverage [email or web address] Password [email or web address] 04/12/2018, 10:29 AM

## 2018-04-12 NOTE — Progress Notes (Signed)
Pt. With increase agitation entire shift, mittens in place to prevent pulling of lines, BP elevated with agitation, [place] provider made aware. Purewick applied several times throughout the shift. Pt. Reoriented as needed, bed alarm on, turned q.2, provided pericare throughout shift. Pt. Yelling help on and off. xanax given as ordered, pt did sleep for about 2.5 hours. NO s/s of distress, VSS. Will continue to monitor pt closely.

## 2018-04-13 DIAGNOSIS — R451 Restlessness and agitation: Secondary | ICD-10-CM

## 2018-04-13 LAB — URINE CULTURE: Culture: 60000 — AB

## 2018-04-13 MED ORDER — HALOPERIDOL LACTATE 5 MG/ML IJ SOLN
5.0000 mg | Freq: Four times a day (QID) | INTRAMUSCULAR | Status: DC | PRN
  Administered 2018-04-13: 5 mg via INTRAVENOUS
  Filled 2018-04-13: qty 1

## 2018-04-13 NOTE — Progress Notes (Signed)
PT Cancellation Note  Patient Details Name: Nancy Meyer MRN: [id number] DOB: 1920-11-18   Cancelled Treatment:     PT order received but eval deferred - RN advises pt has just received Haldol.  Will follow.   Sahian Kerney 04/13/2018, 1:54 PM

## 2018-04-13 NOTE — Progress Notes (Signed)
Nutrition Note  RD consulted for nutritional assessment and poor PO intake.  Initial assessment completed 1/17, pt was diagnosed with moderate malnutrition and was ordered Ensure and Magic Cup supplements.   No further interventions recommended at this time. Will continue to monitor for needs.  Tilda Franco, MS, RD, LDN [facility] Inpatient Clinical Dietitian Pager: [phone] After Hours Pager: [phone]

## 2018-04-13 NOTE — Progress Notes (Signed)
OT Cancellation Note  Patient Details Name: Nancy JewelBarbara P Meyer MRN: [id number] DOB: October 15, 1920   Cancelled Treatment:    Reason Eval/Treat Not Completed: Other (comment) Attempted to meet with pt, RN stating that he just administered pt Haldol and that she is very fatigued and not appropriate for OT in this moment. Will continue to follow up as pt available and appropriate to initiate OT POC.   Nancy HandingKaylee Nickol Collister, MSOT, OTR/L Behavioral Health OT/ Acute Relief OT [facility] Office: [phone]  Nancy HandingKaylee Jassen Sarver 04/13/2018, 1:52 PM

## 2018-04-13 NOTE — Progress Notes (Signed)
PROGRESS NOTE    Nancy Meyer  TZG:[id number] DOB: 09-Jan-1921 DOA: 04/10/2018 PCP: Kaleen Mask, MD   Brief Narrative:  85 y.o. female with medical history significant of hypertension, depression/anxiety, advanced dementia, osteoporosis, chronic lower extremity edema, chronic pain syndrome, severe malnutrition, who presented from memory care facility with 1 week of increasing lethargy, difficulty transferring, slight cough and found to have hypoxia with community-acquired pneumonia. Started on iv Rocephin and zithromax. All cultures remain negative. She's afebrile and wbc is normal. She has weaned off from 3L [place] required on admission. Pending PT eval for discharge planning. Anticipate dc in 24 hrs.   Assessment & Plan:   Principal Problem:   CAP (community acquired pneumonia) Active Problems:   Osteoporosis   HTN (hypertension)   Protein-calorie malnutrition, severe (HCC)   Chronic pain syndrome   Weakness generalized   Advanced dementia (HCC)   #) Acute hypoxic respiratory failure due to pneumonia:  - Hypoxia has resolved and she's now on RA with O2 sat 93% - Continue IV ceftriaxone and azithromycin started 04/10/2018 (day #3) - blood cultures ordered 04/10/2018 no growth - influenza panel ordered 04/10/2018 negative - resume probiotics  #) generalized weakness:  -PT/OT consult still pending for discharge planning -Case management consult  #) Hypertension: HTN urgency on admission resolved -Continue carvedilol 3.125 mg twice daily -Continue losartan 100 mg daily  #) Pain/psych: -Continue alprazolam 0.5 mg twice daily -Continue bupropion 150 mg twice daily - Continue divalproex 250 mg nightly -Continue gabapentin 300 mg 4 times daily  #) Advanced dementia: -Continue donepezil 10 mg daily -- add iv PRN haldol for agitation control  #) Osteoporosis: -Continue calcium and vitamin D supplementations  #) severe malnutrition - nutrition consult for  supplement  #) asymptomatic bacteruria  -already on antibiotics  Fluids: Tolerating p.o. Electrolytes: Monitor and supplement Nutrition: Soft diet   Prophylaxis: Enoxaparin  Disposition: Pending improvement of hypoxia and PT eval  DO NOT RESUSCITATE    Consultants:   nutrition  Procedures:   None  Antimicrobials:   IV ceftriaxone and azithromycin started 04/10/2018   Subjective: RN reported her being intermittently agitated overnight, she's now calm and comfortable but she received iv Haldol a while ago. She felt cold but no fever. All cultures remain negative. Urine bacteria growth is not reaching significant colonies.  She's off O2 and breathing without difficulty on RA  Objective: Vitals:   04/11/18 2317 04/12/18 0259 04/12/18 1337 04/12/18 2118  BP: (!) 169/97 (!) 124/58 (!) 154/75 (!) 143/68  Pulse: 93 61 88 (!) 55  Resp:  20 16 16   Temp:  97.9 F (36.6 C)  98.7 F (37.1 C)  TempSrc:  Axillary  Oral  SpO2:  95% 97% 93%  Weight:      Height:        Intake/Output Summary (Last 24 hours) at 04/13/2018 0919 Last data filed at 04/12/2018 1300 Gross per 24 hour  Intake 100 ml  Output -  Net 100 ml   Filed Weights   04/10/18 1618 04/10/18 1857  Weight: 48 kg 48 kg    Examination:  General exam: Appears calm and comfortable; advanced age. Respiratory system: no respiratory distress, diminished lung sounds at bases, no rhonchi, wheezes or crackles Cardiovascular system: Distant heart sounds, regular rate and rhythm, no murmurs Gastrointestinal system: Soft, nondistended, no rebound or guarding, plus bowel sounds. Central nervous system: Alert but not oriented, grossly intact, moving all extremities Extremities: 1+ edema up to ankles Skin: No rashes over  visible skin Psychiatry: Unable to assess due to underlying dementia    Data Reviewed: I have personally reviewed following labs and imaging studies  CBC: Recent Labs  Lab 04/10/18 1404  04/11/18 0315  WBC 10.7* 7.9  HGB 10.8* 9.3*  HCT 36.7 31.3*  MCV 105.8* 104.7*  PLT 251 222   Basic Metabolic Panel: Recent Labs  Lab 04/10/18 1404 04/11/18 0315  NA 138 139  K 4.6 3.7  CL 103 104  CO2 25 26  GLUCOSE 106* 108*  BUN 27* 22  CREATININE 0.70 0.58  CALCIUM 9.0 8.3*   GFR: Estimated Creatinine Clearance: 30.2 mL/min (by C-G formula based on SCr of 0.58 mg/dL). Liver Function Tests: Recent Labs  Lab 04/10/18 1404  AST 15  ALT 12  ALKPHOS 48  BILITOT 0.6  PROT 6.4*  ALBUMIN 3.5   No results for input(s): LIPASE, AMYLASE in the last 168 hours. No results for input(s): AMMONIA in the last 168 hours. Coagulation Profile: No results for input(s): INR, PROTIME in the last 168 hours. Cardiac Enzymes: No results for input(s): CKTOTAL, CKMB, CKMBINDEX, TROPONINI in the last 168 hours. BNP (last 3 results) No results for input(s): PROBNP in the last 8760 hours. HbA1C: No results for input(s): HGBA1C in the last 72 hours. CBG: Recent Labs  Lab 04/10/18 1643  GLUCAP 97   Lipid Profile: No results for input(s): CHOL, HDL, LDLCALC, TRIG, CHOLHDL, LDLDIRECT in the last 72 hours. Thyroid Function Tests: No results for input(s): TSH, T4TOTAL, FREET4, T3FREE, THYROIDAB in the last 72 hours. Anemia Panel: No results for input(s): VITAMINB12, FOLATE, FERRITIN, TIBC, IRON, RETICCTPCT in the last 72 hours. Sepsis Labs: Recent Labs  Lab 04/10/18 1637 04/10/18 2103 04/11/18 0315 04/12/18 0257  PROCALCITON  --  <0.10 <0.10 <0.10  LATICACIDVEN 0.84  --   --   --     Recent Results (from the past 240 hour(s))  Urine culture     Status: Abnormal   Collection Time: 04/10/18  3:29 PM  Result Value Ref Range Status   Specimen Description   Final    URINE, CLEAN CATCH Performed at [facility], [address] [place], [place] [address]    Special Requests   Final    NONE Performed at [facility], [address]  [place], [place] [address]    Culture 60,000 COLONIES/mL ENTEROCOCCUS FAECALIS (A)  Final   Report Status 04/13/2018 FINAL  Final   Organism ID, Bacteria ENTEROCOCCUS FAECALIS (A)  Final      Susceptibility   Enterococcus faecalis - MIC*    AMPICILLIN <=2 SENSITIVE Sensitive     LEVOFLOXACIN 0.5 SENSITIVE Sensitive     NITROFURANTOIN <=16 SENSITIVE Sensitive     VANCOMYCIN 1 SENSITIVE Sensitive     * 60,000 COLONIES/mL ENTEROCOCCUS FAECALIS  MRSA PCR Screening     Status: None   Collection Time: 04/11/18  5:56 AM  Result Value Ref Range Status   MRSA by PCR NEGATIVE NEGATIVE Final    Comment:        The GeneXpert MRSA Assay (FDA approved for NASAL specimens only), is one component of a comprehensive MRSA colonization surveillance program. It is not intended to diagnose MRSA infection nor to guide or monitor treatment for MRSA infections. Performed at [facility], [address] [place], [place] [address]          Radiology Studies: No results found.      Scheduled Meds: . ALPRAZolam  0.25 mg Oral BID  . azithromycin  250 mg Oral Daily  . buPROPion  300 mg Oral Daily  . calcium-vitamin D  1 tablet Oral Q breakfast  . carvedilol  3.125 mg Oral BID  . cholecalciferol  1,000 Units Oral Q breakfast  . divalproex  125 mg Oral Q24H  . divalproex  250 mg Oral BID  . docusate sodium  250 mg Oral Daily  . donepezil  10 mg Oral QHS  . enoxaparin (LOVENOX) injection  30 mg Subcutaneous Q24H  . famotidine  20 mg Oral Daily  . feeding supplement (ENSURE ENLIVE)  237 mL Oral BID BM  . gabapentin  300 mg Oral Q24H  . gabapentin  600 mg Oral BID  . loratadine  10 mg Oral Q supper  . losartan  100 mg Oral Q breakfast  . mouth rinse  15 mL Mouth Rinse BID  . Melatonin  3 mg Oral QHS  . multivitamin with minerals  1 tablet Oral Daily  . MUSCLE RUB   Topical TID  . nystatin cream  1 application Topical TID  . psyllium  1 packet Oral BID  . saccharomyces  boulardii  250 mg Oral Daily   Continuous Infusions: . cefTRIAXone (ROCEPHIN)  IV 1 g (04/12/18 1834)     LOS: 2 days    Time spent: 35 min    Gala Murdoch, MD [facility]  If 7PM-7AM, please contact night-coverage [email or web address] Password [facility] 04/13/2018, 9:19 AM

## 2018-04-14 DIAGNOSIS — J189 Pneumonia, unspecified organism: Principal | ICD-10-CM

## 2018-04-14 MED ORDER — AMOXICILLIN-POT CLAVULANATE 875-125 MG PO TABS: 1.0000 | ORAL_TABLET | Freq: Two times a day (BID) | ORAL | 0 refills | Status: AC

## 2018-04-14 NOTE — Discharge Instructions (Signed)
Community-Acquired Pneumonia, Adult °Pneumonia is an infection of the lungs. There are different types of pneumonia. One type can develop while a person is in a hospital. A different type, called community-acquired pneumonia, develops in people who are not, or have not recently been, in the hospital or other health care facility. °What are the causes? ° °Pneumonia may be caused by bacteria, viruses, or funguses. Community-acquired pneumonia is often caused by Streptococcus pneumonia bacteria. These bacteria are often passed from one person to another by breathing in droplets from the cough or sneeze of an infected person. °What increases the risk? °The condition is more likely to develop in: °· People who have chronic diseases, such as chronic obstructive pulmonary disease (COPD), asthma, congestive heart failure, cystic fibrosis, diabetes, or kidney disease. °· People who have early-stage or late-stage HIV. °· People who have sickle cell disease. °· People who have had their spleen removed (splenectomy). °· People who have poor dental hygiene. °· People who have medical conditions that increase the risk of breathing in (aspirating) secretions their own mouth and nose. °· People who have a weakened immune system (immunocompromised). °· People who smoke. °· People who travel to areas where pneumonia-causing germs commonly exist. °· People who are around animal habitats or animals that have pneumonia-causing germs, including birds, bats, rabbits, cats, and farm animals. °What are the signs or symptoms? °Symptoms of this condition include: °· A dry cough. °· A wet (productive) cough. °· Fever. °· Sweating. °· Chest pain, especially when breathing deeply or coughing. °· Rapid breathing or difficulty breathing. °· Shortness of breath. °· Shaking chills. °· Fatigue. °· Muscle aches. °How is this diagnosed? °Your health care provider will take a medical history and perform a physical exam. You may also have other tests,  including: °· Imaging studies of your chest, including X-rays. °· Tests to check your blood oxygen level and other blood gases. °· Other tests on blood, mucus (sputum), fluid around your lungs (pleural fluid), and urine. °If your pneumonia is severe, other tests may be done to identify the specific cause of your illness. °How is this treated? °The type of treatment that you receive depends on many factors, such as the cause of your pneumonia, the medicines you take, and other medical conditions that you have. For most adults, treatment and recovery from pneumonia may occur at home. In some cases, treatment must happen in a hospital. Treatment may include: °· Antibiotic medicines, if the pneumonia was caused by bacteria. °· Antiviral medicines, if the pneumonia was caused by a virus. °· Medicines that are given by mouth or through an IV tube. °· Oxygen. °· Respiratory therapy. °Although rare, treating severe pneumonia may include: °· Mechanical ventilation. This is done if you are not breathing well on your own and you cannot maintain a safe blood oxygen level. °· Thoracentesis. This procedure removes fluid around one lung or both lungs to help you breathe better. °Follow these instructions at home: ° °· Take over-the-counter and prescription medicines only as told by your health care provider. °? Only take cough medicine if you are losing sleep. Understand that cough medicine can prevent your body’s natural ability to remove mucus from your lungs. °? If you were prescribed an antibiotic medicine, take it as told by your health care provider. Do not stop taking the antibiotic even if you start to feel better. °· Sleep in a semi-upright position at night. Try sleeping in a reclining chair, or place a few pillows under your head. °· Do not use tobacco products, including cigarettes, chewing tobacco, and e-cigarettes.   If you need help quitting, ask your health care provider. °· Drink enough water to keep your urine  clear or pale yellow. This will help to thin out mucus secretions in your lungs. °How is this prevented? °There are ways that you can decrease your risk of developing community-acquired pneumonia. Consider getting a pneumococcal vaccine if: °· You are older than 83 years of age. °· You are older than 83 years of age and are undergoing cancer treatment, have chronic lung disease, or have other medical conditions that affect your immune system. Ask your health care provider if this applies to you. °There are different types and schedules of pneumococcal vaccines. Ask your health care provider which vaccination option is best for you. °You may also prevent community-acquired pneumonia if you take these actions: °· Get an influenza vaccine every year. Ask your health care provider which type of influenza vaccine is best for you. °· Go to the dentist on a regular basis. °· Wash your hands often. Use hand sanitizer if soap and water are not available. °Contact a health care provider if: °· You have a fever. °· You are losing sleep because you cannot control your cough with cough medicine. °Get help right away if: °· You have worsening shortness of breath. °· You have increased chest pain. °· Your sickness becomes worse, especially if you are an older adult or have a weakened immune system. °· You cough up blood. °This information is not intended to replace advice given to you by your health care provider. Make sure you discuss any questions you have with your health care provider. °Document Released: 03/12/2005 Document Revised: 11/29/2016 Document Reviewed: 07/07/2014 °Elsevier Interactive Patient Education © 2019 [employer] ° °

## 2018-04-14 NOTE — Progress Notes (Signed)
Physical thaerapy note- If patient returns to ALF/Memory care, recommend HHPT consult. Blanchard KelchKaren Kalasia Crafton PT Acute Rehabilitation Services Pager [phone] Office [phone]

## 2018-04-14 NOTE — Progress Notes (Signed)
RN re-checked pt's O2 Sats which was 91-90% on RA at rest. Pt do not have qualifying diagnosis for insurance to pay for home O2. Pt may purchase for $300/month for home O2.

## 2018-04-14 NOTE — Progress Notes (Addendum)
Spoke with Hilda Lias at [facility]- provided DC information for review. Awaiting review and response that facility prepared for pt care and return. Also requested to coordinate facility social worker making contact with pt/family upon return if appropriate. Multiple family members today have discussed pt's care at [facility] with CSW- their concerns are various: that pt needs more supervision (CSW explained supervision [place] generally hiring a private aid or provided by family friends rather than facility staff), needing a bed alarm (CSW explained facilities cannot have bed alarms as this is considered restraint), wanting to know when it may be time for moving into a skilled nursing facility (CSW explained this is indicated by having skilled nursing needs and requiring more clinical care than ALFs provide, rather than needing increased supervision).  After lengthy discussions with various family members, family still very resistant to plan to return to [facility] however unable to identify need for alternate DC plan.   Ilean Skill, MSW, LCSW Clinical Social Work 04/14/2018 [phone]  15:25-[facility] Marchelle Folks) advised facility prepared for pt's return, need hard scripts for augmentin, tramadol, and mucinex. No need for RN report per Marchelle Folks Will arrange transportation once pt ready for DC (pt's family requesting another PT session)

## 2018-04-14 NOTE — Care Management Important Message (Signed)
Important Message  Patient Details  Name: Nancy Meyer MRN: [id number] Date of Birth: 19-Nov-1920   Medicare Important Message Given:  Yes    Caren Macadam 04/14/2018, 11:28 AMImportant Message  Patient Details  Name: Nancy Meyer MRN: [id number] Date of Birth: October 21, 1920   Medicare Important Message Given:  Yes    Caren Macadam 04/14/2018, 11:28 AM

## 2018-04-14 NOTE — Evaluation (Signed)
Occupational Therapy Evaluation Patient Details Name: Nancy Meyer MRN: [id number] DOB: 1920/04/01 Today's Date: 04/14/2018    History of Present Illness 85 yo female from memory care, H/O dementia,HTN,anxiety and depression admitted 04/11/18 with 1 week of increasing lethargy, difficulty transferring, slight cough and found to have hypoxia with community-acquired pneumonia    Clinical Impression   This 85 yo female admitted with above presents to acute OT with decreased mobility and decreased balance both affecting the safety and ability to A with basic ADLs. Pt will continue to benefit from acute OT with follow up at ALF as they deem appropriate based on her PLOF with them.    Follow Up Recommendations  SNF;Supervision/Assistance - 24 hour    Equipment Recommendations  None recommended by OT       Precautions / Restrictions Precautions Precautions: Fall Restrictions Weight Bearing Restrictions: No      Mobility Bed Mobility Overal bed mobility: Needs Assistance Bed Mobility: Sidelying to Sit   Sidelying to sit: Min assist       General bed mobility comments: extra time, assist to scoot to bed edge with patient initiating the activity  Transfers Overall transfer level: Needs assistance   Transfers: Sit to/from Stand;Stand Pivot Transfers Sit to Stand: Max assist Stand pivot transfers: Max assist       General transfer comment: stand and pivot, bear hug technique, from bed to [facility] then to recliner. Patient does bear some weight through legs for transfer.    Balance Overall balance assessment: Needs assistance Sitting-balance support: Feet supported;Bilateral upper extremity supported Sitting balance-Leahy Scale: Fair     Standing balance support: During functional activity;Bilateral upper extremity supported Standing balance-Leahy Scale: Poor Standing balance comment: needs support                           ADL either performed or assessed  with clinical judgement   ADL Overall ADL's : Needs assistance/impaired Eating/Feeding: Supervision/ safety;Set up;Sitting Eating/Feeding Details (indicate cue type and reason): in recliner Grooming: Minimal assistance;Sitting;Wash/dry face;Brushing hair Grooming Details (indicate cue type and reason): in recliner Upper Body Bathing: Minimal assistance;Sitting Upper Body Bathing Details (indicate cue type and reason): in recliner Lower Body Bathing: Maximal assistance Lower Body Bathing Details (indicate cue type and reason): Max A partial stand Upper Body Dressing : Maximal assistance;Sitting Upper Body Dressing Details (indicate cue type and reason): recliner Lower Body Dressing: Total assistance Lower Body Dressing Details (indicate cue type and reason): Max A partial Database administrator Details (indicate cue type and reason): Mod A +2 squat pivot from bed to [facility]; Max A +1 squat pivot from [facility] to recliner Toileting- Clothing Manipulation and Hygiene: Total assistance Toileting - Clothing Manipulation Details (indicate cue type and reason): Max A +1 partial stand             Vision Patient Visual Report: No change from baseline              Pertinent Vitals/Pain Pain Assessment: No/denies pain     Hand Dominance Right   Extremity/Trunk Assessment Upper Extremity Assessment Upper Extremity Assessment: Generalized weakness   Lower Extremity Assessment Lower Extremity Assessment: Generalized weakness   Cervical / Trunk Assessment Cervical / Trunk Assessment: Kyphotic   Communication Communication Communication: No difficulties   Cognition Arousal/Alertness: Awake/alert Behavior During Therapy: WFL for tasks assessed/performed Overall Cognitive Status: No family/caregiver present to determine baseline cognitive functioning  General Comments: oriented to"hospital", follows simple commands, noted to be able to feed  herself when mit removed              Home Living                                   Additional Comments: from ALF/memory care, transfers/WC dep per report.      Prior Functioning/Environment Level of Independence: Needs assistance                 OT Problem List: Decreased strength;Impaired balance (sitting and/or standing);Decreased cognition;Decreased safety awareness      OT Treatment/Interventions: Self-care/ADL training;Balance training;DME and/or AE instruction;Patient/family education    OT Goals(Current goals can be found in the care plan section) Acute Rehab OT Goals Patient Stated Goal: agreed to getting up to [facility] than recliner OT Goal Formulation: With patient Time For Goal Achievement: 04/28/18 Potential to Achieve Goals: Fair  OT Frequency: Min 2X/week           Co-evaluation PT/OT/SLP Co-Evaluation/Treatment: Yes Reason for Co-Treatment: Necessary to address cognition/behavior during functional activity;For patient/therapist safety PT goals addressed during session: Mobility/safety with mobility;Balance;Strengthening/ROM OT goals addressed during session: ADL's and self-care;Strengthening/ROM      AM-PAC OT "6 Clicks" Daily Activity     Outcome Measure Help from another person eating meals?: A Little Help from another person taking care of personal grooming?: A Little Help from another person toileting, which includes using toliet, bedpan, or urinal?: Total Help from another person bathing (including washing, rinsing, drying)?: A Lot Help from another person to put on and taking off regular upper body clothing?: A Lot Help from another person to put on and taking off regular lower body clothing?: Total 6 Click Score: 12   End of Session    Activity Tolerance: Patient tolerated treatment well Patient left: in chair;with call bell/phone within reach;with chair alarm set  OT Visit Diagnosis: Unsteadiness on feet (R26.81);Other  abnormalities of gait and mobility (R26.89);Muscle weakness (generalized) (M62.81)                Time: 4098-1191 OT Time Calculation (min): 20 min Charges:  OT General Charges $OT Visit: 1 Visit OT Evaluation $OT Eval Moderate Complexity: 1 Mod  Ignacia Palma, OTR/L Acute [employer] Pager [phone] Office [phone]     Evette Georges 04/14/2018, 9:40 AM

## 2018-04-14 NOTE — [place] FL2 (Addendum)
[place] MEDICAID FL2 LEVEL OF CARE SCREENING TOOL     IDENTIFICATION  Patient Name: Nancy Meyer Birthdate: 01-18-1921 Sex: female Admission Date (Current Location): 04/10/2018  [facility] and [place] Number:  Producer, television/film/video and Address:  [facility],  [address] [place] [address]      Provider Number: [id number]  Attending Physician Name and Address:  Delaine Lame, MD  Relative Name and Phone Number:       Current Level of Care: Hospital Recommended Level of Care: Memory Care Prior Approval Number:    Date Approved/Denied:   PASRR Number:    Discharge Plan: Other (Comment)(memory care, assisted living)    Current Diagnoses: Patient Active Problem List   Diagnosis Date Noted  . Chronic pain syndrome 04/12/2018  . Weakness generalized 04/12/2018  . Advanced dementia (HCC) 04/12/2018  . CAP (community acquired pneumonia) 04/10/2018  . Traumatic compression fracture of T7 thoracic vertebra 08/29/2016  . Traumatic compression fracture of T11 thoracic vertebra (HCC) 08/29/2016  . Compression fracture of body of thoracic vertebra (HCC)   . Generalized abdominal pain   . Malnutrition of moderate degree 08/28/2016  . Acute urinary retention 08/27/2016  . Rib fractures 01/17/2014  . Acute encephalopathy 01/17/2014  . Protein-calorie malnutrition, severe (HCC) 12/15/2013  . Compression fracture of L1 lumbar vertebra (HCC) 12/15/2013  . Hypokalemia 12/15/2013  . Constipation 12/14/2013  . Nausea alone 12/04/2013  . Depression 11/06/2013  . Unspecified constipation 11/06/2013  . Compression fracture of L4 lumbar vertebra 11/05/2013  . Fracture of vertebra 11/03/2013  . Inguinal hernia, right. 08/09/2011  . Hyperglycemia 03/01/2011  . Seasonal allergies 03/01/2011  . Hyponatremia 02/28/2011  . Normocytic anemia 02/28/2011  . HTN (hypertension) 02/27/2011  . Diskitis 02/27/2011  . Cataract 02/27/2011  . Hyperlipidemia 02/27/2011  .  Osteoporosis     Orientation RESPIRATION BLADDER Height & Weight     Self  normal Continent Weight: 71 lb 6.9 oz (32.4 kg) Height:  5' (152.4 cm)  BEHAVIORAL SYMPTOMS/MOOD NEUROLOGICAL BOWEL NUTRITION STATUS      Continent Diet(soft diet/chopped meats, pureed)  AMBULATORY STATUS COMMUNICATION OF NEEDS Skin   Extensive Assist Verbally Normal                       Personal Care Assistance Level of Assistance  Bathing, Feeding, Dressing Bathing Assistance: Maximum assistance Feeding assistance: Independent Dressing Assistance: Maximum assistance     Functional Limitations Info  Sight, Hearing, Speech Sight Info: Adequate Hearing Info: Adequate Speech Info: Adequate    SPECIAL CARE FACTORS FREQUENCY  OT (By licensed OT)  PT (By licensed PT)       OT Frequency: 2x  PT frequency: 2x          Contractures Contractures Info: Not present    Additional Factors Info  Code Status Code Status Info: DNR             Current Medications (04/14/2018):  This is the current hospital active medication list Current Facility-Administered Medications  Medication Dose Route Frequency Provider Last Rate Last Dose  . acetaminophen (TYLENOL) tablet 650 mg  650 mg Oral Q6H PRN Purohit, Shrey C, MD   650 mg at 04/14/18 1007   Or  . acetaminophen (TYLENOL) suppository 650 mg  650 mg Rectal Q6H PRN Purohit, Shrey C, MD      . ALPRAZolam Prudy Feeler) tablet 0.25 mg  0.25 mg Oral BID Purohit, Shrey C, MD   0.25 mg at  04/14/18 1100  . azithromycin (ZITHROMAX) tablet 250 mg  250 mg Oral Daily Purohit, Shrey C, MD   250 mg at 04/14/18 1100  . buPROPion (WELLBUTRIN XL) 24 hr tablet 300 mg  300 mg Oral Daily Gala Murdoch, MD   300 mg at 04/14/18 1100  . calcium-vitamin D (OSCAL WITH D) 500-200 MG-UNIT per tablet 1 tablet  1 tablet Oral Q breakfast Purohit, Salli Quarry, MD   1 tablet at 04/14/18 0848  . carvedilol (COREG) tablet 3.125 mg  3.125 mg Oral BID Purohit, Shrey C, MD   3.125 mg at 04/14/18  1100  . cefTRIAXone (ROCEPHIN) 1 g in sodium chloride 0.9 % 100 mL IVPB  1 g Intravenous Q24H Purohit, Salli Quarry, MD   Stopped at 04/13/18 1143  . cholecalciferol (VITAMIN D3) tablet 1,000 Units  1,000 Units Oral Q breakfast Purohit, Salli Quarry, MD   1,000 Units at 04/14/18 0848  . divalproex (DEPAKOTE SPRINKLE) capsule 125 mg  125 mg Oral Q24H Purohit, Shrey C, MD   125 mg at 04/13/18 1137  . divalproex (DEPAKOTE SPRINKLE) capsule 250 mg  250 mg Oral BID Purohit, Shrey C, MD   250 mg at 04/14/18 0848  . docusate sodium (COLACE) capsule 250 mg  250 mg Oral Daily Gala Murdoch, MD   250 mg at 04/13/18 0944  . donepezil (ARICEPT) tablet 10 mg  10 mg Oral QHS Purohit, Shrey C, MD   10 mg at 04/13/18 2222  . enoxaparin (LOVENOX) injection 30 mg  30 mg Subcutaneous Q24H Purohit, Shrey C, MD   30 mg at 04/13/18 2222  . famotidine (PEPCID) tablet 20 mg  20 mg Oral Daily Purohit, Shrey C, MD   20 mg at 04/14/18 1100  . feeding supplement (ENSURE ENLIVE) (ENSURE ENLIVE) liquid 237 mL  237 mL Oral BID BM Purohit, Shrey C, MD   237 mL at 04/14/18 1008  . gabapentin (NEURONTIN) capsule 300 mg  300 mg Oral Q24H Purohit, Shrey C, MD   300 mg at 04/12/18 1350  . gabapentin (NEURONTIN) capsule 600 mg  600 mg Oral BID Purohit, Shrey C, MD   600 mg at 04/14/18 1008  . haloperidol lactate (HALDOL) injection 5 mg  5 mg Intravenous Q6H PRN Gala Murdoch, MD   5 mg at 04/13/18 1204  . loratadine (CLARITIN) tablet 10 mg  10 mg Oral Q supper Purohit, Salli Quarry, MD   10 mg at 04/12/18 1837  . losartan (COZAAR) tablet 100 mg  100 mg Oral Q breakfast Purohit, Shrey C, MD   100 mg at 04/14/18 0848  . magnesium citrate solution 1 Bottle  1 Bottle Oral Once PRN Purohit, Shrey C, MD      . MEDLINE mouth rinse  15 mL Mouth Rinse BID Purohit, Shrey C, MD   15 mL at 04/13/18 2244  . Melatonin TABS 3 mg  3 mg Oral QHS Purohit, Shrey C, MD   3 mg at 04/13/18 2222  . multivitamin with minerals tablet 1 tablet  1 tablet Oral Daily Purohit,  Shrey C, MD   1 tablet at 04/14/18 1100  . MUSCLE RUB CREA   Topical TID Purohit, Shrey C, MD      . nystatin cream (MYCOSTATIN) 1 application  1 application Topical TID Purohit, Salli Quarry, MD   1 application at 04/14/18 1100  . ondansetron (ZOFRAN) tablet 4 mg  4 mg Oral Q6H PRN Purohit, Salli Quarry, MD  Or  . ondansetron (ZOFRAN) injection 4 mg  4 mg Intravenous Q6H PRN Purohit, Salli Quarry, MD   4 mg at 04/14/18 0214  . polyethylene glycol (MIRALAX / GLYCOLAX) packet 17 g  17 g Oral Daily PRN Purohit, Shrey C, MD      . psyllium (HYDROCIL/METAMUCIL) packet 1 packet  1 packet Oral BID Purohit, Salli Quarry, MD   1 packet at 04/13/18 2222  . saccharomyces boulardii (FLORASTOR) capsule 250 mg  250 mg Oral Daily Gala Murdoch, MD   250 mg at 04/14/18 1100  . senna (SENOKOT) tablet 8.6 mg  1 tablet Oral BID PRN Gala Murdoch, MD      . simethicone [facility]) chewable tablet 80 mg  80 mg Oral Q6H PRN Purohit, Shrey C, MD      . sodium chloride flush (NS) 0.9 % injection 10-40 mL  10-40 mL Intracatheter PRN Gala Murdoch, MD      . traMADol Janean Sark) tablet 50 mg  50 mg Oral Q6H PRN Purohit, Salli Quarry, MD   50 mg at 04/14/18 1007     Discharge Medications: TAKE these medications   acetaminophen 650 MG CR tablet Commonly known as:  TYLENOL Take 650 mg by mouth 3 (three) times daily.   ALPRAZolam 0.25 MG tablet Commonly known as:  XANAX Take 0.25 mg by mouth 2 (two) times daily.   amoxicillin-clavulanate 875-125 MG tablet Commonly known as:  AUGMENTIN Take 1 tablet by mouth 2 (two) times daily for 5 days.   BIOFREEZE 4 % Gel Generic drug:  Menthol (Topical Analgesic) Apply 1 application topically 3 (three) times daily.   buPROPion 300 MG 24 hr tablet Commonly known as:  WELLBUTRIN XL Take 300 mg by mouth daily.   calcium citrate-vitamin D 315-200 MG-UNIT tablet Commonly known as:  CITRACAL+D Take 1 tablet by mouth daily with breakfast.   carvedilol 3.125 MG tablet Commonly known as:   COREG Take 3.125 mg by mouth 2 (two) times daily with a meal.   cholecalciferol 1000 units tablet Commonly known as:  VITAMIN D Take 1,000 Units by mouth daily with breakfast.   CLEARLAX packet Generic drug:  polyethylene glycol Take 17 g by mouth daily.   dextromethorphan-guaiFENesin 30-600 MG 12hr tablet Commonly known as:  MUCINEX DM Take 2 tablets by mouth every 12 (twelve) hours. 7 Day Course   divalproex 125 MG capsule Commonly known as:  DEPAKOTE SPRINKLE Take 125-250 mg by mouth 3 (three) times daily. Take 2 capsules (250 mg) in the morning, Take 1 capsule (125 mg) at noon, Take 2 capsules (250 mg) at 8 pm. HOLD FOR SEDATION   docusate sodium 250 MG capsule Commonly known as:  COLACE Take 250 mg by mouth daily.   donepezil 10 MG tablet Commonly known as:  ARICEPT Take 10 mg by mouth at bedtime.   famotidine 20 MG tablet Commonly known as:  PEPCID Take 1 tablet (20 mg total) by mouth daily.   furosemide 20 MG tablet Commonly known as:  LASIX Take 1 tablet (20 mg total) by mouth daily.   gabapentin 300 MG capsule Commonly known as:  NEURONTIN Take 300-600 mg by mouth 3 (three) times daily. Take 2 capsules (600 mg) in the morning, Take 1 capsule (300 mg) at 1 pm and Take 2 capsules (600 mg) at bedtime.   loratadine 10 MG tablet Commonly known as:  CLARITIN Take 10 mg by mouth daily as needed for allergies.   losartan 100 MG tablet Commonly  known as:  COZAAR Take 100 mg by mouth daily with breakfast.   Melatonin 3 MG Tabs Take 3 mg by mouth at bedtime.   METAMUCIL FIBER SINGLES PO Take 1 packet by mouth daily.   nystatin cream Commonly known as:  MYCOSTATIN Apply 1 application topically 2 (two) times daily as needed for dry skin. Apply under left breast   OCUVITE PRESERVISION PO Take 1 tablet by mouth daily as needed (nutrition).   ondansetron 4 MG tablet Commonly known as:  ZOFRAN Take 4 mg by mouth every 8 (eight) hours as needed for  nausea or vomiting.   potassium chloride SA 20 MEQ tablet Commonly known as:  K-DUR,KLOR-CON Take 20 mEq by mouth daily.   PROBIOTIC PO Take 1 capsule by mouth daily.   senna 8.6 MG tablet Commonly known as:  SENOKOT Take 1 tablet by mouth 2 (two) times daily as needed for constipation.   Simethicone 125 MG Tabs Take 1 tablet by mouth every 8 (eight) hours as needed (for gas).   traMADol 50 MG tablet Commonly known as:  ULTRAM Take 0.5 tablets (25 mg total) by mouth every 6 (six) hours as needed for severe pain. What changed:  how much to take      Relevant Imaging Results:  Relevant Lab Results:   Additional Information SS#: [id number]. Needs home health RN  Nelwyn SalisburyMeghan R Nalanie Winiecki, LCSW

## 2018-04-14 NOTE — Discharge Summary (Signed)
Physician Discharge Summary  Nancy Meyer:[id number] DOB: 05-07-1920 DOA: 04/10/2018  PCP: Kaleen Mask, MD  Admit date: 04/10/2018 Discharge date: 04/14/2018  Admitted From: Memory Care Disposition:  Memory Care  Recommendations for Outpatient Follow-up:  1. Follow up with PCP in 1-2 weeks 2. Please obtain BMP/CBC in one week   Home Health: No Equipment/Devices:No  Discharge Condition: stable CODE STATUS: DNR Diet recommendation: soft diet   Brief/Interim Summary:  #) Acute hypoxic respiratory failure due to pneumonia: Patient was admitted with acute hypoxic respiratory failure, concerning for pneumonia on chest x-ray.  Patient was given IV ceftriaxone and azithromycin.  Patient's oxygen requirement resolved.  Influenza panel was negative.  Blood cultures on 04/10/2018 were negative.  #) Possible UTI: Patient's urine culture on 04/10/2018 grew out enterococcus.  This was susceptible to Augmentin.  Patient was discharged on Augmentin for both pneumonia as well as possible UTI.  #) Fatigue/lethargy: This was thought to be secondary to either pneumonia or UTI.  PT was consulted and patient will return to baseline.  #) Hypertension: Patient was continued on home losartan and carvedilol.  #) Pain/psych: Patient was continued on home alprazolam, bupropion, divalproex, gabapentin.  #) Dementia: Patient was continued on donepezil.  #) Osteoporosis: Patient was continued on calcium and vitamin D.  Discharge Diagnoses:  Principal Problem:   CAP (community acquired pneumonia) Active Problems:   Osteoporosis   HTN (hypertension)   Protein-calorie malnutrition, severe (HCC)   Chronic pain syndrome   Weakness generalized   Advanced dementia [facility])    Discharge Instructions  Discharge Instructions    Call MD for:  difficulty breathing, headache or visual disturbances   Complete by:  As directed    Call MD for:  hives   Complete by:  As directed    Call MD for:   persistant dizziness or light-headedness   Complete by:  As directed    Call MD for:  persistant nausea and vomiting   Complete by:  As directed    Call MD for:  redness, tenderness, or signs of infection (pain, swelling, redness, odor or green/yellow discharge around incision site)   Complete by:  As directed    Call MD for:  severe uncontrolled pain   Complete by:  As directed    Call MD for:  temperature >100.4   Complete by:  As directed    Diet - low sodium heart healthy   Complete by:  As directed    Discharge instructions   Complete by:  As directed    Please take your antibiotics as prescribed.  Please follow-up with your primary care doctor in 1 week.   Increase activity slowly   Complete by:  As directed      Allergies as of 04/14/2018      Reactions   Macrobid [nitrofurantoin] Itching      Medication List    TAKE these medications   acetaminophen 650 MG CR tablet Commonly known as:  TYLENOL Take 650 mg by mouth 3 (three) times daily.   ALPRAZolam 0.25 MG tablet Commonly known as:  XANAX Take 0.25 mg by mouth 2 (two) times daily.   amoxicillin-clavulanate 875-125 MG tablet Commonly known as:  AUGMENTIN Take 1 tablet by mouth 2 (two) times daily for 5 days.   BIOFREEZE 4 % Gel Generic drug:  Menthol (Topical Analgesic) Apply 1 application topically 3 (three) times daily.   buPROPion 300 MG 24 hr tablet Commonly known as:  WELLBUTRIN XL Take 300 mg  by mouth daily.   calcium citrate-vitamin D 315-200 MG-UNIT tablet Commonly known as:  CITRACAL+D Take 1 tablet by mouth daily with breakfast.   carvedilol 3.125 MG tablet Commonly known as:  COREG Take 3.125 mg by mouth 2 (two) times daily with a meal.   cholecalciferol 1000 units tablet Commonly known as:  VITAMIN D Take 1,000 Units by mouth daily with breakfast.   CLEARLAX packet Generic drug:  polyethylene glycol Take 17 g by mouth daily.   dextromethorphan-guaiFENesin 30-600 MG 12hr  tablet Commonly known as:  MUCINEX DM Take 2 tablets by mouth every 12 (twelve) hours. 7 Day Course   divalproex 125 MG capsule Commonly known as:  DEPAKOTE SPRINKLE Take 125-250 mg by mouth 3 (three) times daily. Take 2 capsules (250 mg) in the morning, Take 1 capsule (125 mg) at noon, Take 2 capsules (250 mg) at 8 pm. HOLD FOR SEDATION   docusate sodium 250 MG capsule Commonly known as:  COLACE Take 250 mg by mouth daily.   donepezil 10 MG tablet Commonly known as:  ARICEPT Take 10 mg by mouth at bedtime.   famotidine 20 MG tablet Commonly known as:  PEPCID Take 1 tablet (20 mg total) by mouth daily.   furosemide 20 MG tablet Commonly known as:  LASIX Take 1 tablet (20 mg total) by mouth daily.   gabapentin 300 MG capsule Commonly known as:  NEURONTIN Take 300-600 mg by mouth 3 (three) times daily. Take 2 capsules (600 mg) in the morning, Take 1 capsule (300 mg) at 1 pm and Take 2 capsules (600 mg) at bedtime.   loratadine 10 MG tablet Commonly known as:  CLARITIN Take 10 mg by mouth daily as needed for allergies.   losartan 100 MG tablet Commonly known as:  COZAAR Take 100 mg by mouth daily with breakfast.   Melatonin 3 MG Tabs Take 3 mg by mouth at bedtime.   METAMUCIL FIBER SINGLES PO Take 1 packet by mouth daily.   nystatin cream Commonly known as:  MYCOSTATIN Apply 1 application topically 2 (two) times daily as needed for dry skin. Apply under left breast   OCUVITE PRESERVISION PO Take 1 tablet by mouth daily as needed (nutrition).   ondansetron 4 MG tablet Commonly known as:  ZOFRAN Take 4 mg by mouth every 8 (eight) hours as needed for nausea or vomiting.   potassium chloride SA 20 MEQ tablet Commonly known as:  K-DUR,KLOR-CON Take 20 mEq by mouth daily.   PROBIOTIC PO Take 1 capsule by mouth daily.   senna 8.6 MG tablet Commonly known as:  SENOKOT Take 1 tablet by mouth 2 (two) times daily as needed for constipation.   Simethicone 125 MG  Tabs Take 1 tablet by mouth every 8 (eight) hours as needed (for gas).   traMADol 50 MG tablet Commonly known as:  ULTRAM Take 0.5 tablets (25 mg total) by mouth every 6 (six) hours as needed for severe pain. What changed:  how much to take      Contact information for after-discharge care    Destination    [facility] .   Service:  Assisted Living Contact information: [address] [place] [id number] [phone]             Allergies  Allergen Reactions  . Macrobid [Nitrofurantoin] Itching    Consultations:  None   Procedures/Studies: Dg Chest 2 View  Result Date: 04/10/2018 CLINICAL DATA:  85 year old female with lethargy and nonproductive  cough. EXAM: CHEST - 2 VIEW COMPARISON:  Portable chest 03/31/2017 and earlier. FINDINGS: Semi upright AP and lateral views of the chest. Chronic elevation of the right hemidiaphragm. Increased linear, streaky and patchy right lower lung and perihilar opacity. No definite pleural effusion. The left lung appears stable and clear. Stable cardiac size and mediastinal contours. No pneumothorax or pulmonary edema. Stable visualized osseous structures. Paucity of bowel gas in the upper abdomen. IMPRESSION: Chronically elevated right hemidiaphragm but new right lung base opacity suspicious for pneumonia. No definite pleural effusion. Electronically Signed   By: Odessa FlemingH  Hall M.D.   On: 04/10/2018 16:14     Subjective:   Discharge Exam: Vitals:   04/13/18 2103 04/14/18 0514  BP: (!) 169/81 (!) 152/73  Pulse: 77 91  Resp: 16 18  Temp: 97.8 F (36.6 C) (!) 97.5 F (36.4 C)  SpO2: 96% 91%   Vitals:   04/12/18 2118 04/13/18 2103 04/14/18 0349 04/14/18 0514  BP: (!) 143/68 (!) 169/81  (!) 152/73  Pulse: (!) 55 77  91  Resp: 16 16  18   Temp: 98.7 F (37.1 C) 97.8 F (36.6 C)  (!) 97.5 F (36.4 C)  TempSrc: Oral Oral  Oral  SpO2: 93% 96%  91%  Weight:   32.4 kg   Height:       General exam:  Appears calm and comfortable  Respiratory system: no increased work of breathing, diminished lung sounds at bases, scattered rhonchi, no wheezes or crackles Cardiovascular system: Distant heart sounds, regular rate and rhythm, no murmurs Gastrointestinal system: Soft, nondistended, no rebound or guarding, plus bowel sounds. Central nervous system: Alert but not oriented, grossly intact, moving all extremities Extremities: 1+ edema up to ankles Skin: No rashes over visible skin Psychiatry: Unable to assess due to underlying dementia  The results of significant diagnostics from this hospitalization (including imaging, microbiology, ancillary and laboratory) are listed below for reference.     Microbiology: Recent Results (from the past 240 hour(s))  Urine culture     Status: Abnormal   Collection Time: 04/10/18  3:29 PM  Result Value Ref Range Status   Specimen Description   Final    URINE, CLEAN CATCH Performed at [facility], [address] [place], [place] [address]    Special Requests   Final    NONE Performed at [facility], [address] [place], [place] [address]    Culture 60,000 COLONIES/mL ENTEROCOCCUS FAECALIS (A)  Final   Report Status 04/13/2018 FINAL  Final   Organism ID, Bacteria ENTEROCOCCUS FAECALIS (A)  Final      Susceptibility   Enterococcus faecalis - MIC*    AMPICILLIN <=2 SENSITIVE Sensitive     LEVOFLOXACIN 0.5 SENSITIVE Sensitive     NITROFURANTOIN <=16 SENSITIVE Sensitive     VANCOMYCIN 1 SENSITIVE Sensitive     * 60,000 COLONIES/mL ENTEROCOCCUS FAECALIS  MRSA PCR Screening     Status: None   Collection Time: 04/11/18  5:56 AM  Result Value Ref Range Status   MRSA by PCR NEGATIVE NEGATIVE Final    Comment:        The GeneXpert MRSA Assay (FDA approved for NASAL specimens only), is one component of a comprehensive MRSA colonization surveillance program. It is not intended to diagnose MRSA infection nor to  guide or monitor treatment for MRSA infections. Performed at [facility], [address] [place], [place] [address]      Labs: BNP (last 3 results)  No results for input(s): BNP in the last 8760 hours. Basic Metabolic Panel: Recent Labs  Lab 04/10/18 1404 04/11/18 0315  NA 138 139  K 4.6 3.7  CL 103 104  CO2 25 26  GLUCOSE 106* 108*  BUN 27* 22  CREATININE 0.70 0.58  CALCIUM 9.0 8.3*   Liver Function Tests: Recent Labs  Lab 04/10/18 1404  AST 15  ALT 12  ALKPHOS 48  BILITOT 0.6  PROT 6.4*  ALBUMIN 3.5   No results for input(s): LIPASE, AMYLASE in the last 168 hours. No results for input(s): AMMONIA in the last 168 hours. CBC: Recent Labs  Lab 04/10/18 1404 04/11/18 0315  WBC 10.7* 7.9  HGB 10.8* 9.3*  HCT 36.7 31.3*  MCV 105.8* 104.7*  PLT 251 222   Cardiac Enzymes: No results for input(s): CKTOTAL, CKMB, CKMBINDEX, TROPONINI in the last 168 hours. BNP: Invalid input(s): POCBNP CBG: Recent Labs  Lab 04/10/18 1643  GLUCAP 97   D-Dimer No results for input(s): DDIMER in the last 72 hours. Hgb A1c No results for input(s): HGBA1C in the last 72 hours. Lipid Profile No results for input(s): CHOL, HDL, LDLCALC, TRIG, CHOLHDL, LDLDIRECT in the last 72 hours. Thyroid function studies No results for input(s): TSH, T4TOTAL, T3FREE, THYROIDAB in the last 72 hours.  Invalid input(s): FREET3 Anemia work up No results for input(s): VITAMINB12, FOLATE, FERRITIN, TIBC, IRON, RETICCTPCT in the last 72 hours. Urinalysis    Component Value Date/Time   COLORURINE YELLOW 04/10/2018 1526   APPEARANCEUR CLEAR 04/10/2018 1526   LABSPEC 1.020 04/10/2018 1526   PHURINE 6.0 04/10/2018 1526   GLUCOSEU NEGATIVE 04/10/2018 1526   HGBUR NEGATIVE 04/10/2018 1526   BILIRUBINUR NEGATIVE 04/10/2018 1526   KETONESUR 20 (A) 04/10/2018 1526   PROTEINUR NEGATIVE 04/10/2018 1526   UROBILINOGEN 0.2 01/17/2014 1826   NITRITE NEGATIVE 04/10/2018 1526    LEUKOCYTESUR NEGATIVE 04/10/2018 1526   Sepsis Labs Invalid input(s): PROCALCITONIN,  WBC,  LACTICIDVEN Microbiology Recent Results (from the past 240 hour(s))  Urine culture     Status: Abnormal   Collection Time: 04/10/18  3:29 PM  Result Value Ref Range Status   Specimen Description   Final    URINE, CLEAN CATCH Performed at [facility], [address], [place], [place] [address]    Special Requests   Final    NONE Performed at [facility], [address], [place], [place] [address]    Culture 60,000 COLONIES/mL ENTEROCOCCUS FAECALIS (A)  Final   Report Status 04/13/2018 FINAL  Final   Organism ID, Bacteria ENTEROCOCCUS FAECALIS (A)  Final      Susceptibility   Enterococcus faecalis - MIC*    AMPICILLIN <=2 SENSITIVE Sensitive     LEVOFLOXACIN 0.5 SENSITIVE Sensitive     NITROFURANTOIN <=16 SENSITIVE Sensitive     VANCOMYCIN 1 SENSITIVE Sensitive     * 60,000 COLONIES/mL ENTEROCOCCUS FAECALIS  MRSA PCR Screening     Status: None   Collection Time: 04/11/18  5:56 AM  Result Value Ref Range Status   MRSA by PCR NEGATIVE NEGATIVE Final    Comment:        The GeneXpert MRSA Assay (FDA approved for NASAL specimens only), is one component of a comprehensive MRSA colonization surveillance program. It is not intended to diagnose MRSA infection nor to guide or monitor treatment for MRSA infections. Performed at [facility], [address] [place], [place] [address]      Time coordinating  discharge: 35  SIGNED:   Delaine Lame, MD  [facility] 04/14/2018, 11:50 AM Pager   If 7PM-7AM, please contact night-coverage [email or web address] Password [email or web address]

## 2018-04-14 NOTE — Evaluation (Signed)
Physical Therapy Evaluation Patient Details Name: Nancy JewelBarbara P Meyer MRN: [id number] DOB: 1920-09-02 Today's Date: 04/14/2018   History of Present Illness  85 yo female from memory care, H/O ndementia,HTN,anxiety and depre admitted  04/11/18 with 1   week of increasing lethargy, difficulty transferring, slight cough and found to have hypoxia with community-acquired pneumonia   Clinical Impression  The  Patient is alert and pleasant. [facility] then to [facility] with 1 assist for pivot transfer. Patient comes from memory care unit. No family present and prior level of function unknown. . Pt admitted with above diagnosis. Pt currently with functional limitations due to the deficits listed below (see PT Problem List).  Pt will benefit from skilled PT to increase their independence and safety with mobility to allow discharge to the venue listed below.       Follow Up Recommendations (back to ALF is ?  the plan)    Equipment Recommendations  None recommended by PT    Recommendations for Other Services       Precautions / Restrictions Precautions Precautions: Fall      Mobility  Bed Mobility Overal bed mobility: Needs Assistance Bed Mobility: Sidelying to Sit   Sidelying to sit: Min assist       General bed mobility comments: extra time, assist to scoot to bed edge with patient initiating the activity  Transfers Overall transfer level: Needs assistance   Transfers: Sit to/from Stand;Stand Pivot Transfers Sit to Stand: Max assist Stand pivot transfers: Max assist       General transfer comment: stand and pibot, bear hug technique, [facility] then [facility]. Patient does bear some weight through legs for transfer.  Ambulation/Gait                Stairs            Wheelchair Mobility    Modified Rankin (Stroke Patients Only)       Balance Overall balance assessment: Needs assistance Sitting-balance support: Feet  supported;Bilateral upper extremity supported Sitting balance-Leahy Scale: Fair     Standing balance support: During functional activity;Bilateral upper extremity supported Standing balance-Leahy Scale: Poor Standing balance comment: needs support                             Pertinent Vitals/Pain Pain Assessment: No/denies pain    Home Living                   Additional Comments: from ALF/memory care, transfers/WC dep per report.    Prior Function Level of Independence: Needs assistance               Hand Dominance        Extremity/Trunk Assessment        Lower Extremity Assessment Lower Extremity Assessment: Generalized weakness    Cervical / Trunk Assessment Cervical / Trunk Assessment: Kyphotic  Communication   Communication: No difficulties  Cognition Arousal/Alertness: Awake/alert Behavior During Therapy: WFL for tasks assessed/performed Overall Cognitive Status: No family/caregiver present to determine baseline cognitive functioning                                 General Comments: oriented to"hospital", follows simple commands, noted to be able to feed herself when mit removed      General Comments      Exercises  Assessment/Plan    PT Assessment    PT Problem List         PT Treatment Interventions      PT Goals (Current goals can be found in the Care Plan section)  Acute Rehab PT Goals PT Goal Formulation: Patient unable to participate in goal setting Time For Goal Achievement: 04/28/18 Potential to Achieve Goals: Fair    Frequency     Barriers to discharge        Co-evaluation               AM-PAC PT "6 Clicks" Mobility  Outcome Measure Help needed turning from your back to your side while in a flat bed without using bedrails?: A Lot Help needed moving from lying on your back to sitting on the side of a flat bed without using bedrails?: Total Help needed moving to and from a bed to  a chair (including a wheelchair)?: Total Help needed standing up from a chair using your arms (e.g., wheelchair or bedside chair)?: Total Help needed to walk in hospital room?: Total Help needed climbing 3-5 steps with a railing? : Total 6 Click Score: 7    End of Session   Activity Tolerance: Patient tolerated treatment well Patient left: in chair;with call bell/phone within reach;with chair alarm set Nurse Communication: Mobility status PT Visit Diagnosis: Unsteadiness on feet (R26.81)    Time: 0802-0822 PT Time Calculation (min) (ACUTE ONLY): 20 min   Charges:   PT Evaluation $PT Eval Low Complexity: 1 Low          Blanchard Kelch PT Acute Rehabilitation Services Pager [phone] Office [phone]   Rada Hay 04/14/2018, 8:46 AM

## 2018-04-15 ENCOUNTER — Other Ambulatory Visit: Payer: Self-pay

## 2018-04-15 NOTE — Patient Outreach (Signed)
[facility]) Care Management  04/15/2018  AVYN HERSI Jan 28, 1921 [id number]  Successful outreach to the patients son Dette Brignac, patient HIPAA identifiers confirmed. BSW introduced self and the reason for today's call, indicating the patient had been referred to [facility] Care Management for caregiver resources. Mr. Kesterson reports his mother currently resides in an assisted living memory care community. She has experienced several falls. Mr. Bensel reported he contacted the patients insurance plan to inquire if there was any coverage to assist with hiring a sitter.   BSW explained that unfortunately sitter services are not covered under Medicare. BSW inquired if Mr. Waight was interested in a list of agencies who provide sitter services. Mr. Wiker stated "it's a dead issue without insurance coverage" due to lack of resources. Mr. Rabanales reports he and his family try to visit as often as they can but they are unable to be there around the clock. BSW provided ideas for interventions to keep the patient engaged in hopes of eliminating falls including an established toileting schedule, asking the staff to keep her in a common area and more visible, asking the staff to engage her in life skills such as folding washcloths or wiping tables after meals.   BSW explained that with dementia there are anxious times where patients may feel the need to get up and do something. BSW further explained that providing engagement may help to alleviate some of this anxious energy and allow the patient a sense of purpose.  Mr. Hardin understands all dementia patients respond differently to interventions but plans to discuss some of these ideas with the staff where the patient resides in hopes of identifying options the patient would participate in. BSW encouraged Mr. Jasinski to outreach this BSW if he identifies any community resource needs. BSW will mark the patient as inactive at  this time.  Bevelyn Ngo, BSW, CDP [facility] [phone]

## 2018-04-25 ENCOUNTER — Emergency Department (HOSPITAL_COMMUNITY)
Admission: EM | Admit: 2018-04-25 | Discharge: 2018-04-25 | Disposition: A | Discharge status: Home / Self Care | Payer: PPO | Attending: Emergency Medicine | Admitting: Emergency Medicine

## 2018-04-25 ENCOUNTER — Other Ambulatory Visit: Payer: Self-pay

## 2018-04-25 ENCOUNTER — Encounter (HOSPITAL_COMMUNITY): Payer: Self-pay | Admitting: Emergency Medicine

## 2018-04-25 DIAGNOSIS — F0390 Unspecified dementia without behavioral disturbance: Secondary | ICD-10-CM | POA: Insufficient documentation

## 2018-04-25 DIAGNOSIS — R58 Hemorrhage, not elsewhere classified: Secondary | ICD-10-CM | POA: Diagnosis not present

## 2018-04-25 DIAGNOSIS — N390 Urinary tract infection, site not specified: Secondary | ICD-10-CM | POA: Diagnosis not present

## 2018-04-25 DIAGNOSIS — Z79899 Other long term (current) drug therapy: Secondary | ICD-10-CM | POA: Diagnosis not present

## 2018-04-25 DIAGNOSIS — R319 Hematuria, unspecified: Secondary | ICD-10-CM | POA: Diagnosis not present

## 2018-04-25 DIAGNOSIS — D649 Anemia, unspecified: Secondary | ICD-10-CM | POA: Diagnosis not present

## 2018-04-25 DIAGNOSIS — R339 Retention of urine, unspecified: Secondary | ICD-10-CM | POA: Diagnosis not present

## 2018-04-25 DIAGNOSIS — R569 Unspecified convulsions: Secondary | ICD-10-CM | POA: Diagnosis not present

## 2018-04-25 DIAGNOSIS — K649 Unspecified hemorrhoids: Secondary | ICD-10-CM | POA: Insufficient documentation

## 2018-04-25 DIAGNOSIS — K625 Hemorrhage of anus and rectum: Secondary | ICD-10-CM | POA: Insufficient documentation

## 2018-04-25 DIAGNOSIS — I10 Essential (primary) hypertension: Secondary | ICD-10-CM | POA: Diagnosis not present

## 2018-04-25 DIAGNOSIS — R1030 Lower abdominal pain, unspecified: Secondary | ICD-10-CM | POA: Diagnosis not present

## 2018-04-25 DIAGNOSIS — R1084 Generalized abdominal pain: Secondary | ICD-10-CM | POA: Diagnosis not present

## 2018-04-25 LAB — CBC WITH DIFFERENTIAL/PLATELET
ABS IMMATURE GRANULOCYTES: 0.02 10*3/uL (ref 0.00–0.07)
Basophils Absolute: 0.1 10*3/uL (ref 0.0–0.1)
Basophils Relative: 1 %
Eosinophils Absolute: 0.1 10*3/uL (ref 0.0–0.5)
Eosinophils Relative: 2 %
HCT: 36.5 % (ref 36.0–46.0)
HEMOGLOBIN: 10.9 g/dL — AB (ref 12.0–15.0)
IMMATURE GRANULOCYTES: 0 %
LYMPHS PCT: 18 %
Lymphs Abs: 1.1 10*3/uL (ref 0.7–4.0)
MCH: 30.1 pg (ref 26.0–34.0)
MCHC: 29.9 g/dL — ABNORMAL LOW (ref 30.0–36.0)
MCV: 100.8 fL — ABNORMAL HIGH (ref 80.0–100.0)
Monocytes Absolute: 0.7 10*3/uL (ref 0.1–1.0)
Monocytes Relative: 11 %
Neutro Abs: 4.3 10*3/uL (ref 1.7–7.7)
Neutrophils Relative %: 68 %
Platelets: 314 10*3/uL (ref 150–400)
RBC: 3.62 MIL/uL — ABNORMAL LOW (ref 3.87–5.11)
RDW: 14.3 % (ref 11.5–15.5)
WBC: 6.3 10*3/uL (ref 4.0–10.5)
nRBC: 0 % (ref 0.0–0.2)

## 2018-04-25 LAB — PROTIME-INR
INR: 0.89
Prothrombin Time: 12 seconds (ref 11.4–15.2)

## 2018-04-25 LAB — COMPREHENSIVE METABOLIC PANEL
ALK PHOS: 44 U/L (ref 38–126)
ALT: 13 U/L (ref 0–44)
AST: 15 U/L (ref 15–41)
Albumin: 3.5 g/dL (ref 3.5–5.0)
Anion gap: 8 (ref 5–15)
BUN: 24 mg/dL — AB (ref 8–23)
CO2: 29 mmol/L (ref 22–32)
Calcium: 8.7 mg/dL — ABNORMAL LOW (ref 8.9–10.3)
Chloride: 102 mmol/L (ref 98–111)
Creatinine, Ser: 0.85 mg/dL (ref 0.44–1.00)
GFR calc Af Amer: >60 mL/min (ref >60)
GFR calc non Af Amer: 58 mL/min — ABNORMAL LOW (ref >60)
Glucose, Bld: 102 mg/dL — ABNORMAL HIGH (ref 70–99)
Potassium: 4.1 mmol/L (ref 3.5–5.1)
Sodium: 139 mmol/L (ref 135–145)
Total Bilirubin: 0.7 mg/dL (ref 0.3–1.2)
Total Protein: 6.1 g/dL — ABNORMAL LOW (ref 6.5–8.1)

## 2018-04-25 LAB — URINALYSIS, ROUTINE W REFLEX MICROSCOPIC
Bilirubin Urine: NEGATIVE
Glucose, UA: NEGATIVE mg/dL
Hgb urine dipstick: NEGATIVE
Ketones, ur: NEGATIVE mg/dL
Leukocytes, UA: NEGATIVE
Nitrite: NEGATIVE
Protein, ur: NEGATIVE mg/dL
Specific Gravity, Urine: 1.015 (ref 1.005–1.030)
pH: 6 (ref 5.0–8.0)

## 2018-04-25 LAB — TYPE AND SCREEN
ABO/RH(D): A POS
Antibody Screen: NEGATIVE

## 2018-04-25 LAB — ABO/RH: ABO/RH(D): A POS

## 2018-04-25 LAB — POC OCCULT BLOOD, ED: Fecal Occult Bld: POSITIVE — AB

## 2018-04-25 MED ORDER — ACETAMINOPHEN 500 MG PO TABS
1000.0000 mg | ORAL_TABLET | Freq: Once | ORAL | Status: AC
  Administered 2018-04-25: 1000 mg via ORAL
  Filled 2018-04-25: qty 2

## 2018-04-25 MED ORDER — LORAZEPAM 0.5 MG PO TABS
0.5000 mg | ORAL_TABLET | Freq: Two times a day (BID) | ORAL | Status: DC | PRN
  Administered 2018-04-25: 0.5 mg via ORAL
  Filled 2018-04-25: qty 1

## 2018-04-25 NOTE — ED Notes (Signed)
Patient given [place] sandwich and tea.

## 2018-04-25 NOTE — ED Notes (Signed)
Patient requesting pain medicine. MD made aware. 

## 2018-04-25 NOTE — ED Notes (Addendum)
Assisted patient with use of bedpan. Patient reports she is unable to urinate.

## 2018-04-25 NOTE — ED Notes (Signed)
Bed: WA01 Expected date:  Expected time:  Means of arrival:  Comments: 85 yo rectal bleeding

## 2018-04-25 NOTE — ED Provider Notes (Signed)
[facility]-EMERGENCY DEPT Provider Note   CSN: [id number] Arrival date & time: 04/25/18  1018     History   Chief Complaint Chief Complaint  Patient presents with  . Rectal Bleeding    HPI Nancy Meyer is a 85 y.o. female.  HPI   [age]yo female with history of dementia, hypertension, hyperlipidemia presents with concern for bright red blood per rectum from her assisted living facility. Per EMS, patient urinated and there was approximately 20cc of bright red blood in the toilet. The patient reports she had a hard stool days ago and that is why she had bright red blood.  No nausea, no vomiting, no diarrhea. Chronic constipation and alternating between diarrhea and constipation per family.  No fever.   Past Medical History:  Diagnosis Date  . Anxiety and depression   . Arthritis   . Chronic pain   . Compression fracture of C-spine (HCC)   . Dementia (HCC)   . Hyperlipidemia   . Hypertension   . Macular degeneration   . Osteoporosis     Patient Active Problem List   Diagnosis Date Noted  . Chronic pain syndrome 04/12/2018  . Weakness generalized 04/12/2018  . Advanced dementia (HCC) 04/12/2018  . CAP (community acquired pneumonia) 04/10/2018  . Traumatic compression fracture of T7 thoracic vertebra 08/29/2016  . Traumatic compression fracture of T11 thoracic vertebra (HCC) 08/29/2016  . Compression fracture of body of thoracic vertebra (HCC)   . Generalized abdominal pain   . Malnutrition of moderate degree 08/28/2016  . Acute urinary retention 08/27/2016  . Rib fractures 01/17/2014  . Acute encephalopathy 01/17/2014  . Protein-calorie malnutrition, severe (HCC) 12/15/2013  . Compression fracture of L1 lumbar vertebra (HCC) 12/15/2013  . Hypokalemia 12/15/2013  . Constipation 12/14/2013  . Nausea alone 12/04/2013  . Depression 11/06/2013  . Unspecified constipation 11/06/2013  . Compression fracture of L4 lumbar vertebra 11/05/2013  .  Fracture of vertebra 11/03/2013  . Inguinal hernia, right. 08/09/2011  . Hyperglycemia 03/01/2011  . Seasonal allergies 03/01/2011  . Hyponatremia 02/28/2011  . Normocytic anemia 02/28/2011  . HTN (hypertension) 02/27/2011  . Diskitis 02/27/2011  . Cataract 02/27/2011  . Hyperlipidemia 02/27/2011  . Osteoporosis     Past Surgical History:  Procedure Laterality Date  . APPENDECTOMY    . BACK SURGERY     kyphoplasty  . EYE SURGERY    . IR RADIOLOGIST EVAL & MGMT  10/01/2016     OB History    Gravida  3   Para  3   Term  3   Preterm      AB      Living        SAB      TAB      Ectopic      Multiple      Live Births               Home Medications    Prior to Admission medications   Medication Sig Start Date End Date Taking? Authorizing Provider  acetaminophen (TYLENOL) 650 MG CR tablet Take 650 mg by mouth 3 (three) times daily.   Yes [provider]  buPROPion (WELLBUTRIN XL) 300 MG 24 hr tablet Take 300 mg by mouth daily.  03/27/18  Yes [provider]  calcium citrate-vitamin D (CITRACAL+D) 315-200 MG-UNIT tablet Take 1 tablet by mouth daily with breakfast.   Yes [provider]  carvedilol (COREG) 3.125 MG tablet Take 3.125 mg by mouth  2 (two) times daily with a meal.   Yes [provider]  cholecalciferol (VITAMIN D) 25 MCG (1000 UT) tablet Take 1,000 Units by mouth daily.   Yes [provider]  divalproex (DEPAKOTE SPRINKLE) 125 MG capsule Take 125-250 mg by mouth 3 (three) times daily. Take 2 capsules (250 mg) in the morning, Take 1 capsule (125 mg) at noon, Take 2 capsules (250 mg) at 8 pm. HOLD FOR SEDATION   Yes [provider]  docusate sodium (COLACE) 250 MG capsule Take 250 mg by mouth daily.   Yes [provider]  donepezil (ARICEPT) 10 MG tablet Take 10 mg by mouth at bedtime.    Yes [provider]  famotidine (PEPCID) 20 MG tablet Take 1 tablet (20 mg total) by mouth daily.  09/01/16  Yes Rolly Salter, MD  furosemide (LASIX) 20 MG tablet Take 1 tablet (20 mg total) by mouth daily. 03/31/17  Yes Dione Booze, MD  gabapentin (NEURONTIN) 300 MG capsule Take 300-600 mg by mouth 3 (three) times daily. Take 2 capsules (600 mg) in the morning, Take 1 capsule (300 mg) at 1 pm and Take 2 capsules (600 mg) at bedtime.   Yes [provider]  loratadine (CLARITIN) 10 MG tablet Take 10 mg by mouth daily as needed for allergies.    Yes [provider]  LORazepam (ATIVAN) 0.5 MG tablet Take 0.5 mg by mouth 2 (two) times daily.    Yes [provider]  LORazepam (ATIVAN) 0.5 MG tablet Take 0.5 mg by mouth 2 (two) times daily as needed (agitation).   Yes [provider]  losartan (COZAAR) 100 MG tablet Take 100 mg by mouth daily with breakfast.  07/12/11  Yes [provider]  Melatonin 3 MG TABS Take 3 mg by mouth at bedtime.    Yes [provider]  Menthol, Topical Analgesic, (BIOFREEZE) 4 % GEL Apply 1 application topically 3 (three) times daily. Apply to back and hip   Yes [provider]  Multiple Vitamins-Minerals (OCUVITE PRESERVISION PO) Take 1 tablet by mouth daily as needed (nutrition).    Yes [provider]  ondansetron (ZOFRAN) 4 MG tablet Take 4 mg by mouth every 8 (eight) hours as needed for nausea or vomiting.   Yes [provider]  polyethylene glycol (CLEARLAX) packet Take 17 g by mouth daily.    Yes [provider]  potassium chloride SA (K-DUR,KLOR-CON) 20 MEQ tablet Take 20 mEq by mouth daily.   Yes [provider]  Probiotic Product (PROBIOTIC PO) Take 1 capsule by mouth daily.   Yes [provider]  senna (SENOKOT) 8.6 MG tablet Take 1 tablet by mouth 2 (two) times daily as needed for constipation.   Yes [provider]  Simethicone 125 MG TABS Take 1 tablet by mouth every 8 (eight) hours as needed (for gas).    Yes [provider]  traMADol  (ULTRAM) 50 MG tablet Take 0.5 tablets (25 mg total) by mouth every 6 (six) hours as needed for severe pain. 08/31/16  Yes Rolly Salter, MD    Family History Family History  Problem Relation Age of Onset  . Cancer Daughter        breast    Social History Social History   Tobacco Use  . Smoking status: Never Smoker  . Smokeless tobacco: Never Used  Substance Use Topics  . Alcohol use: No  . Drug use: No     Allergies  Macrobid [nitrofurantoin]   Review of Systems Review of Systems  Unable to perform ROS: Dementia     Physical Exam Updated Vital Signs BP (!) 159/79   Pulse 79   Temp 98.6 F (37 C) (Oral)   Resp 20   SpO2 94%   Physical Exam Vitals signs and nursing note reviewed.  Constitutional:      General: She is not in acute distress.    Appearance: She is well-developed. She is not diaphoretic.  HENT:     Head: Normocephalic and atraumatic.  Eyes:     Conjunctiva/sclera: Conjunctivae normal.  Neck:     Musculoskeletal: Normal range of motion.  Cardiovascular:     Rate and Rhythm: Normal rate and regular rhythm.     Heart sounds: Normal heart sounds. No murmur. No friction rub. No gallop.   Pulmonary:     Effort: Pulmonary effort is normal. No respiratory distress.     Breath sounds: Normal breath sounds. No wheezing or rales.  Abdominal:     General: There is no distension.     Palpations: Abdomen is soft.     Tenderness: There is no abdominal tenderness. There is no guarding.  Genitourinary:    Rectum: External hemorrhoid present.  Musculoskeletal:        General: No tenderness.  Skin:    General: Skin is warm and dry.     Findings: No erythema or rash.  Neurological:     Mental Status: She is alert.     Comments: Oriented to self, states in OR, year [id number]      ED Treatments / Results  Labs (all labs ordered are listed, but only abnormal results are displayed) Labs Reviewed  CBC WITH DIFFERENTIAL/PLATELET - Abnormal; Notable for  the following components:      Result Value   RBC 3.62 (*)    Hemoglobin 10.9 (*)    MCV 100.8 (*)    MCHC 29.9 (*)    All other components within normal limits  COMPREHENSIVE METABOLIC PANEL - Abnormal; Notable for the following components:   Glucose, Bld 102 (*)    BUN 24 (*)    Calcium 8.7 (*)    Total Protein 6.1 (*)    GFR calc non Af Amer 58 (*)    All other components within normal limits  POC OCCULT BLOOD, ED - Abnormal; Notable for the following components:   Fecal Occult Bld POSITIVE (*)    All other components within normal limits  URINE CULTURE  URINALYSIS, ROUTINE W REFLEX MICROSCOPIC  PROTIME-INR  TYPE AND SCREEN  ABO/RH    EKG None  Radiology No results found.  Procedures Procedures (including critical care time)  Medications Ordered in ED Medications  acetaminophen (TYLENOL) tablet 1,000 mg (1,000 mg Oral Given 04/25/18 1147)     Initial Impression / Assessment and Plan / ED Course  I have reviewed the triage vital signs and the nursing notes.  Pertinent labs & imaging results that were available during my care of the patient were reviewed by me and considered in my medical decision making (see chart for details).      85yo female with history of dementia, hypertension, hyperlipidemia presents with concern for bright red blood per rectum from her assisted living facility.  Patient hemodynamically stable in the ED through hours of observation. Rectal exam with normal color stool, hemorrhoids on exam, occult blood positive likely secondary to hemorrhoids.  Patient without episode of bleeding in the ED during period of  observation, and history and rectal exam most consistent with rectal bleeding from hemorrhoids. Hgb at baseline. Other labs WNL.  Patient stable for outpatient monitoring.  Doubt diverticular bleed.  Discussed reasons to return in detail. Discussed GI follow up is appropriate to evaluate for other sources of bleeding, however family reports  difficulty moving pt out of facility and goals of care are more focused on comfort.  Discussed recheck hgb with PCP appropriate as well as return to the ED for worsening symptoms.   Patient reported difficulty urinating in ED, has hx of prior urinary retention. Approx 350cc in bladder and foley catheter placed.  Discussed discharge with catheter, however family reports she is in assisted living and are concerned regarding patient pulling out catheter or pulling it off leg and tripping.  They report at times she states she cannot urinate however after they wait she is able to. Given this hx, normal Cr, risks vs benefit of catheter with unclear history of patient voluntarily holding versus inability to urinate, will remove catheter and recommend continued monitoring and recheck Cr with PCP.      Final Clinical Impressions(s) / ED Diagnoses   Final diagnoses:  Rectal bleeding  Hemorrhoids, unspecified hemorrhoid type    ED Discharge Orders    None       Alvira Monday, MD 04/25/18 2120

## 2018-04-25 NOTE — Discharge Instructions (Addendum)
You were seen today for rectal bleeding. You have hemorrhoids on exam and bright red blood noted may be secondary to hemorrhoid bleeding. Your blood levels are unchanged from prior visits and your blood pressures and heart rate are normal.  Continue to monitor for bleeding, if you are passing large clots you need to return to the ED, if you have small amount of bright red blood on tissue paper I suspect it is from hemorrhoids. It is unclear if your difficulty urinating today was due to anxiety or another problem, however in the setting of history of similar problems, normal kidney function without catheter, no abdominal pain, and concern for fall risk we have decided to remove the catheter. Please follow up with your PCP for a recheck of your kidneys and your blood levels.

## 2018-04-25 NOTE — ED Triage Notes (Signed)
Pt from NH, staff from NH noticed blood in beside commode after pt used the bathroom. Pt reports she strained to use bathroom, but no stool present in bedside commode.

## 2018-04-26 LAB — URINE CULTURE: Culture: NO GROWTH

## 2018-04-30 ENCOUNTER — Encounter: Payer: PPO | Admitting: Nurse Practitioner

## 2018-05-01 ENCOUNTER — Encounter: Payer: PPO | Admitting: Nurse Practitioner

## 2018-05-01 ENCOUNTER — Other Ambulatory Visit: Payer: Self-pay | Admitting: *Deleted

## 2018-05-01 NOTE — Patient Outreach (Signed)
THN Review of pt chart. Note an entry from [facility] Called To speak with Stephanie [place], NP, who may have seen this pt. I left a message on her voice mail to return my call.  Zara Council. Burgess Estelle, MSN, [facility] Gerontological Nurse Practitioner [facility] Care Management [phone]

## 2018-05-01 NOTE — Patient Outreach (Signed)
Addendum: Anna Genre, NP, returned my call and confirmed she has seen Mrs. Stohr and has been admitting into the [facility], Palliative Care Team. She said the usual workflow will be for a provider to see her once a month. I thanked her for her report and advised that I would not be involved if they are going to be.  Zara Council. Burgess Estelle, MSN, [facility] Gerontological Nurse Practitioner [facility] Care Management [phone]

## 2018-05-02 ENCOUNTER — Encounter: Payer: Self-pay | Admitting: Nurse Practitioner

## 2018-05-02 NOTE — Progress Notes (Unsigned)
Therapist, nutritional Palliative Care Consult Note Telephone: [phone]  Fax: [phone]  PATIENT NAME: Nancy Meyer DOB: 25-Feb-1921 MRN: [id number]  PRIMARY CARE PROVIDER:   Kaleen Mask, MD  REFERRING PROVIDER:  Kaleen Mask, MD [address] [place], [place] [address]  RESPONSIBLE PARTY:   Leslian Battaglino (daughter) [phone]  ASSESSMENT/PLAN:  Dementia wo behavioral disturbance -gait disorder    -Pain              I spent *** minutes providing this consultation,  from *** to ***. More than 50% of the time in this consultation was spent coordinating communication.   HISTORY OF PRESENT ILLNESS:  Nancy Meyer is a 85 y.o. year old female with multiple medical problems including ***. Palliative Care was asked to help address goals of care.   CODE STATUS:   PPS: 0% HOSPICE ELIGIBILITY/DIAGNOSIS: TBD  PAST MEDICAL HISTORY:  Past Medical History:  Diagnosis Date  . Anxiety and depression   . Arthritis   . Chronic pain   . Compression fracture of C-spine (HCC)   . Dementia (HCC)   . Hyperlipidemia   . Hypertension   . Macular degeneration   . Osteoporosis     SOCIAL HX:  Social History   Tobacco Use  . Smoking status: Never Smoker  . Smokeless tobacco: Never Used  Substance Use Topics  . Alcohol use: No    ALLERGIES:  Allergies  Allergen Reactions  . Macrobid [Nitrofurantoin] Itching     PERTINENT MEDICATIONS:  Outpatient Encounter Medications as of 05/01/2018  Medication Sig  . acetaminophen (TYLENOL) 650 MG CR tablet Take 650 mg by mouth 3 (three) times daily.  Marland Kitchen buPROPion (WELLBUTRIN XL) 300 MG 24 hr tablet Take 300 mg by mouth daily.   . calcium citrate-vitamin D (CITRACAL+D) 315-200 MG-UNIT tablet Take 1 tablet by mouth daily with breakfast.  . carvedilol (COREG) 3.125 MG tablet Take 3.125 mg by mouth 2 (two) times daily with a meal.  . cholecalciferol (VITAMIN D) 25 MCG (1000 UT) tablet  Take 1,000 Units by mouth daily.  . divalproex (DEPAKOTE SPRINKLE) 125 MG capsule Take 125-250 mg by mouth 3 (three) times daily. Take 2 capsules (250 mg) in the morning, Take 1 capsule (125 mg) at noon, Take 2 capsules (250 mg) at 8 pm. HOLD FOR SEDATION  . docusate sodium (COLACE) 250 MG capsule Take 250 mg by mouth daily.  Marland Kitchen donepezil (ARICEPT) 10 MG tablet Take 10 mg by mouth at bedtime.   . famotidine (PEPCID) 20 MG tablet Take 1 tablet (20 mg total) by mouth daily.  . furosemide (LASIX) 20 MG tablet Take 1 tablet (20 mg total) by mouth daily.  Marland Kitchen gabapentin (NEURONTIN) 300 MG capsule Take 300-600 mg by mouth 3 (three) times daily. Take 2 capsules (600 mg) in the morning, Take 1 capsule (300 mg) at 1 pm and Take 2 capsules (600 mg) at bedtime.  Marland Kitchen loratadine (CLARITIN) 10 MG tablet Take 10 mg by mouth daily as needed for allergies.   Marland Kitchen LORazepam (ATIVAN) 0.5 MG tablet Take 0.5 mg by mouth 2 (two) times daily.   Marland Kitchen LORazepam (ATIVAN) 0.5 MG tablet Take 0.5 mg by mouth 2 (two) times daily as needed (agitation).  Marland Kitchen losartan (COZAAR) 100 MG tablet Take 100 mg by mouth daily with breakfast.   . Melatonin 3 MG TABS Take 3 mg by mouth at bedtime.   . Menthol, Topical Analgesic, (BIOFREEZE) 4 % GEL Apply 1  application topically 3 (three) times daily. Apply to back and hip  . Multiple Vitamins-Minerals (OCUVITE PRESERVISION PO) Take 1 tablet by mouth daily as needed (nutrition).   . ondansetron (ZOFRAN) 4 MG tablet Take 4 mg by mouth every 8 (eight) hours as needed for nausea or vomiting.  . polyethylene glycol (CLEARLAX) packet Take 17 g by mouth daily.   . potassium chloride SA (K-DUR,KLOR-CON) 20 MEQ tablet Take 20 mEq by mouth daily.  . Probiotic Product (PROBIOTIC PO) Take 1 capsule by mouth daily.  Marland Kitchen senna (SENOKOT) 8.6 MG tablet Take 1 tablet by mouth 2 (two) times daily as needed for constipation.  . Simethicone 125 MG TABS Take 1 tablet by mouth every 8 (eight) hours as needed (for gas).   .  traMADol (ULTRAM) 50 MG tablet Take 0.5 tablets (25 mg total) by mouth every 6 (six) hours as needed for severe pain.   No facility-administered encounter medications on file as of 05/01/2018.     PHYSICAL EXAM:   General: NAD, frail appearing, thin, severely kyphotic Cardiovascular: regular rate and rhythm Pulmonary: clear ant fields Abdomen: soft, nontender, + bowel sounds GU: no suprapubic tenderness Extremities: no edema, no joint deformities Skin: no rashes Neurological: Weakness but otherwise nonfocal  Powell Halbert G Sw[place], NP

## 2018-05-07 DIAGNOSIS — Z79899 Other long term (current) drug therapy: Secondary | ICD-10-CM | POA: Diagnosis not present

## 2018-05-07 DIAGNOSIS — R5383 Other fatigue: Secondary | ICD-10-CM | POA: Diagnosis not present

## 2018-05-07 DIAGNOSIS — R609 Edema, unspecified: Secondary | ICD-10-CM | POA: Diagnosis not present

## 2018-05-07 DIAGNOSIS — D649 Anemia, unspecified: Secondary | ICD-10-CM | POA: Diagnosis not present

## 2018-05-07 DIAGNOSIS — I10 Essential (primary) hypertension: Secondary | ICD-10-CM | POA: Diagnosis not present

## 2018-05-14 ENCOUNTER — Encounter: Payer: Self-pay | Admitting: Adult Health Nurse Practitioner

## 2018-05-14 ENCOUNTER — Non-Acute Institutional Stay: Payer: PPO | Admitting: Adult Health Nurse Practitioner

## 2018-05-14 DIAGNOSIS — Z515 Encounter for palliative care: Secondary | ICD-10-CM | POA: Diagnosis not present

## 2018-05-14 NOTE — Progress Notes (Signed)
Therapist, nutritional Palliative Care Consult Note Telephone: [phone]  Fax: [phone] 05/14/2018  PATIENT NAME: Nancy Meyer DOB: 08/09/1920 MRN: [id number]  PRIMARY CARE PROVIDER:   Dr.Wilson Despina Hick PROVIDER:  Lance Bosch NP  RESPONSIBLE PARTY:   Nancy Meyer (son) [phone]             Daughter, Nancy Meyer, [phone]       RECOMMENDATIONS and PLAN:  1.  Pain.  Send order to facility for Salon Pas patches as discussed at last appointment with patient on 05/07/2018.  Family does state that at last appointment Salon Pas patches were discussed to help with pain management.  Patient currently on scheduled Tylenol 650 mg TID, Biofreeze applied topically TID, and Tramadol 50 mg Q6 hrs PRN.  Family does state that the tramadol does make her sleepy.  Would prefer not to use this too often due to risk of falls.   2. Hemorrhoids. Preparation H ointment PRN up to 4 times a day.  Patient had recent ER visit for rectal bleeding believed to be related to hemorrhoids.  Patient has issues with constipation and is currently on daily Miralax and colace.  Continue current medications for constipation so patient does not strain with BMs that would aggravate the hemorrhoids.  3.  Recurrent UTIs.  Could add a cranberry supplement; patient is currently taking a daily probiotic.  Discussed with family prophylactic antibiotics and the risks of resistance.  Family would prefer to try cranberry supplement over prophylactic antibiotics at this time due to possible ongoing abdominal discomfort.    4.  Advanced Care Directives.  Patient has DNR in place.  Family states that there is a living will in place but could not locate it on the chart today.  Advised family to make sure living will is located at facility and if not to make sure there is a copy at the facility.  5. Anxiety/agitation.  Continue with recommendations from geriatric psychiatrist.  Patient recently was  switched from xanax to ativan 0.5 mg BID.  Family does report that she has had less anxiety and agitation with the switch.    I spent 90 minutes providing this consultation,  from 9:40 to 11:10. More than 50% of the time in this consultation was spent coordinating communication. Did have meeting with family that included son, daughter, and son-in-law.  Spoke with staff at [employer] office to check on order for Salon Pas patches.  HISTORY OF PRESENT ILLNESS:  Nancy Meyer is a 85 yo female who resides at the memory care unit at [facility] [place].  Patient referred to palliative for symptom management.  Staff and family state that patient does require 1 person assistance with ADLs and transfers.  She has fair appetite with no changes in weight.    CODE STATUS: DNR  PPS: 50% HOSPICE ELIGIBILITY/DIAGNOSIS: TBD  PHYSICAL EXAM:   General: Thin elderly female in NAD Cardiovascular: regular rate and rhythm,  No gallops, rubs, murmurs heard Pulmonary: lung fields clear throughout with normal respiratory effort Abdomen: soft, nontender, + bowel sounds GU: no suprapubic tenderness Extremities: patient has nonpitting edema noted to bilateral lower extremities Neurological: Patient A&O to self only.  Pleasant and cooperative with exam.  No facial drooping, no slurred speech, equal hand grips.  Nancy Meyer K. Andrena Mews, AGNP-C

## 2018-06-04 ENCOUNTER — Ambulatory Visit: Payer: Self-pay | Admitting: Nurse Practitioner

## 2018-06-05 DIAGNOSIS — M25551 Pain in right hip: Secondary | ICD-10-CM | POA: Diagnosis not present

## 2018-06-05 DIAGNOSIS — W19XXXA Unspecified fall, initial encounter: Secondary | ICD-10-CM | POA: Diagnosis not present

## 2018-06-05 DIAGNOSIS — S32000S Wedge compression fracture of unspecified lumbar vertebra, sequela: Secondary | ICD-10-CM | POA: Diagnosis not present

## 2018-06-05 DIAGNOSIS — M25552 Pain in left hip: Secondary | ICD-10-CM | POA: Diagnosis not present

## 2018-06-06 ENCOUNTER — Non-Acute Institutional Stay: Payer: PPO | Admitting: Adult Health Nurse Practitioner

## 2018-06-06 ENCOUNTER — Other Ambulatory Visit: Payer: Self-pay

## 2018-06-06 DIAGNOSIS — Z515 Encounter for palliative care: Secondary | ICD-10-CM

## 2018-06-06 NOTE — Progress Notes (Addendum)
Therapist, nutritional Palliative Care Consult Note Telephone: [phone]  Fax: [phone]  PATIENT NAME: Nancy Meyer DOB: March 22, 1921 MRN: [id number]  PRIMARY CARE PROVIDER:   Kaleen Mask, MD  REFERRING PROVIDER:  Lance Bosch NP  RESPONSIBLE PARTY:   Niyla Zamor (son) [phone]                                              Daughter, Rinaldo Cloud, [phone]     RECOMMENDATIONS and PLAN:  1. Pain.  Salon Pas Patches have been added.  Spoke with son via phone and he states that she will complain of pain but when redirected the pain goes away.  Also states that the pain will go away immediately after given tylenol.  He is unsure if it more mental than physical  2.  Advanced Care Directives.  Patient has DNR in place.  No changes today  5. Anxiety/agitation.  Continue with recommendations from geriatric psychiatrist, Dr. Salley Slaughter.  Son states that the ativan at 0.5mg  is making her too lethargic.  Staff reports that they do not see a difference in her anxiety/agitation even when given the PRN dose of ativan.  This will be hard to control as staff and family do report that higher doses will make her more lethargic and she has increased falls.  Son does state that she tried xanax in the past which seemed to worsen her agitation.  Have seen with other patients where buspar or zoloft have helped with agitation.  Would recommend if trying one of these that it would replace another psych med instead of adding another med to prevent excessive drowsiness which could lead to falls.  Patient can be redirected but the redirection does not last long.  Getting involved in group activities or staff providing one on one attention as able would help with anxiety as well  3.  Frequent urination.  Patient has to go to the bathroom often.  Son states that this has been going on for years.  Oxybutynin may help but this can cause dehydration   I spent 30 minutes  providing this consultation,  from 9:00 to 9:30. More than 50% of the time in this consultation was spent coordinating communication.   HISTORY OF PRESENT ILLNESS:  Nancy Meyer is a 85 yo female who resides at the memory care unit at [facility] [place].  Patient referred to palliative for symptom management.  Staff and family state that patient does require 1 person assistance with ADLs and transfers.  She has fair appetite with no changes in weight.  CODE STATUS: DNR  PPS: 50% HOSPICE ELIGIBILITY/DIAGNOSIS: TBD  PHYSICAL EXAM:   General: Thin elderly female in NAD Cardiovascular: regular rate and rhythm,  No gallops, rubs, murmurs heard Pulmonary: lung fields clear throughout with normal respiratory effort Abdomen: soft, nontender, + bowel sounds GU: no suprapubic tenderness Extremities: patient has nonpitting edema noted to bilateral lower extremities Neurological: Patient A&O to self only.  Pleasant and cooperative with exam.  No facial drooping, no slurred speech.  PAST MEDICAL HISTORY:  Past Medical History:  Diagnosis Date  . Anxiety and depression   . Arthritis   . Chronic pain   . Compression fracture of C-spine (HCC)   . Dementia (HCC)   . Hyperlipidemia   . Hypertension   . Macular degeneration   . Osteoporosis  SOCIAL HX:  Social History   Tobacco Use  . Smoking status: Never Smoker  . Smokeless tobacco: Never Used  Substance Use Topics  . Alcohol use: No    ALLERGIES:  Allergies  Allergen Reactions  . Macrobid [Nitrofurantoin] Itching     PERTINENT MEDICATIONS:  Outpatient Encounter Medications as of 06/06/2018  Medication Sig  . acetaminophen (TYLENOL) 650 MG CR tablet Take 650 mg by mouth 3 (three) times daily.  Marland Kitchen buPROPion (WELLBUTRIN XL) 300 MG 24 hr tablet Take 300 mg by mouth daily.   . calcium citrate-vitamin D (CITRACAL+D) 315-200 MG-UNIT tablet Take 1 tablet by mouth daily with breakfast.  . carvedilol (COREG) 3.125 MG tablet Take 3.125  mg by mouth 2 (two) times daily with a meal.  . cholecalciferol (VITAMIN D) 25 MCG (1000 UT) tablet Take 1,000 Units by mouth daily.  . divalproex (DEPAKOTE SPRINKLE) 125 MG capsule Take 125-250 mg by mouth See admin instructions. Take 2 capsules (250 mg) in the morning, Take 1 capsule (125 mg) at noon, Take 2 capsules (250 mg) at 8 pm. HOLD FOR SEDATION  . docusate sodium (COLACE) 250 MG capsule Take 250 mg by mouth daily.  Marland Kitchen donepezil (ARICEPT) 10 MG tablet Take 10 mg by mouth at bedtime.   . famotidine (PEPCID) 20 MG tablet Take 1 tablet (20 mg total) by mouth daily.  . furosemide (LASIX) 20 MG tablet Take 1 tablet (20 mg total) by mouth daily.  Marland Kitchen gabapentin (NEURONTIN) 300 MG capsule Take 300-600 mg by mouth See admin instructions. Take 2 capsules (600 mg) in the morning, Take 1 capsule (300 mg) at 1 pm and Take 2 capsules (600 mg) at bedtime.  Marland Kitchen loratadine (CLARITIN) 10 MG tablet Take 10 mg by mouth daily as needed for allergies.   Marland Kitchen LORazepam (ATIVAN) 0.5 MG tablet Take 0.5 mg by mouth 2 (two) times daily.   Marland Kitchen LORazepam (ATIVAN) 0.5 MG tablet Take 0.5 mg by mouth 2 (two) times daily as needed (agitation).  Marland Kitchen losartan (COZAAR) 100 MG tablet Take 100 mg by mouth daily with breakfast.   . Melatonin 3 MG TABS Take 3 mg by mouth at bedtime.   . Menthol, Topical Analgesic, (BIOFREEZE) 4 % GEL Apply 1 application topically 3 (three) times daily. Apply to back and hip  . Multiple Vitamins-Minerals (OCUVITE PRESERVISION PO) Take 1 tablet by mouth daily as needed (nutrition).   . ondansetron (ZOFRAN) 4 MG tablet Take 4 mg by mouth every 8 (eight) hours as needed for nausea or vomiting.  . polyethylene glycol (CLEARLAX) packet Take 17 g by mouth daily.   . potassium chloride SA (K-DUR,KLOR-CON) 20 MEQ tablet Take 20 mEq by mouth daily.  . Probiotic Product (PROBIOTIC PO) Take 1 capsule by mouth daily.  Marland Kitchen senna (SENOKOT) 8.6 MG tablet Take 1 tablet by mouth 2 (two) times daily as needed for  constipation.  . Simethicone 125 MG TABS Take 125 mg by mouth every 8 (eight) hours as needed (for gas).   . traMADol (ULTRAM) 50 MG tablet Take 0.5 tablets (25 mg total) by mouth every 6 (six) hours as needed for severe pain.   No facility-administered encounter medications on file as of 06/06/2018.      Ida Milbrath Marlena Clipper, NP

## 2018-06-09 DIAGNOSIS — M25532 Pain in left wrist: Secondary | ICD-10-CM | POA: Diagnosis not present

## 2018-06-15 DIAGNOSIS — F0390 Unspecified dementia without behavioral disturbance: Secondary | ICD-10-CM | POA: Diagnosis not present

## 2018-06-15 DIAGNOSIS — Z9181 History of falling: Secondary | ICD-10-CM | POA: Diagnosis not present

## 2018-06-15 DIAGNOSIS — F419 Anxiety disorder, unspecified: Secondary | ICD-10-CM | POA: Diagnosis not present

## 2018-06-15 DIAGNOSIS — H3530 Unspecified macular degeneration: Secondary | ICD-10-CM | POA: Diagnosis not present

## 2018-06-15 DIAGNOSIS — E785 Hyperlipidemia, unspecified: Secondary | ICD-10-CM | POA: Diagnosis not present

## 2018-06-15 DIAGNOSIS — M1990 Unspecified osteoarthritis, unspecified site: Secondary | ICD-10-CM | POA: Diagnosis not present

## 2018-06-15 DIAGNOSIS — I10 Essential (primary) hypertension: Secondary | ICD-10-CM | POA: Diagnosis not present

## 2018-06-15 DIAGNOSIS — G8929 Other chronic pain: Secondary | ICD-10-CM | POA: Diagnosis not present

## 2018-06-15 DIAGNOSIS — M810 Age-related osteoporosis without current pathological fracture: Secondary | ICD-10-CM | POA: Diagnosis not present

## 2018-06-15 DIAGNOSIS — F339 Major depressive disorder, recurrent, unspecified: Secondary | ICD-10-CM | POA: Diagnosis not present

## 2018-06-19 DIAGNOSIS — F0390 Unspecified dementia without behavioral disturbance: Secondary | ICD-10-CM | POA: Diagnosis not present

## 2018-06-19 DIAGNOSIS — G8929 Other chronic pain: Secondary | ICD-10-CM | POA: Diagnosis not present

## 2018-06-19 DIAGNOSIS — M1990 Unspecified osteoarthritis, unspecified site: Secondary | ICD-10-CM | POA: Diagnosis not present

## 2018-06-19 DIAGNOSIS — H3530 Unspecified macular degeneration: Secondary | ICD-10-CM | POA: Diagnosis not present

## 2018-06-19 DIAGNOSIS — M810 Age-related osteoporosis without current pathological fracture: Secondary | ICD-10-CM | POA: Diagnosis not present

## 2018-06-19 DIAGNOSIS — Z9181 History of falling: Secondary | ICD-10-CM | POA: Diagnosis not present

## 2018-06-19 DIAGNOSIS — F339 Major depressive disorder, recurrent, unspecified: Secondary | ICD-10-CM | POA: Diagnosis not present

## 2018-06-19 DIAGNOSIS — E785 Hyperlipidemia, unspecified: Secondary | ICD-10-CM | POA: Diagnosis not present

## 2018-06-19 DIAGNOSIS — F419 Anxiety disorder, unspecified: Secondary | ICD-10-CM | POA: Diagnosis not present

## 2018-06-19 DIAGNOSIS — I10 Essential (primary) hypertension: Secondary | ICD-10-CM | POA: Diagnosis not present

## 2018-06-24 DIAGNOSIS — M1990 Unspecified osteoarthritis, unspecified site: Secondary | ICD-10-CM | POA: Diagnosis not present

## 2018-06-24 DIAGNOSIS — H3530 Unspecified macular degeneration: Secondary | ICD-10-CM | POA: Diagnosis not present

## 2018-06-24 DIAGNOSIS — F0390 Unspecified dementia without behavioral disturbance: Secondary | ICD-10-CM | POA: Diagnosis not present

## 2018-06-24 DIAGNOSIS — I10 Essential (primary) hypertension: Secondary | ICD-10-CM | POA: Diagnosis not present

## 2018-06-24 DIAGNOSIS — F339 Major depressive disorder, recurrent, unspecified: Secondary | ICD-10-CM | POA: Diagnosis not present

## 2018-06-24 DIAGNOSIS — Z9181 History of falling: Secondary | ICD-10-CM | POA: Diagnosis not present

## 2018-06-24 DIAGNOSIS — F419 Anxiety disorder, unspecified: Secondary | ICD-10-CM | POA: Diagnosis not present

## 2018-06-24 DIAGNOSIS — G8929 Other chronic pain: Secondary | ICD-10-CM | POA: Diagnosis not present

## 2018-06-24 DIAGNOSIS — E785 Hyperlipidemia, unspecified: Secondary | ICD-10-CM | POA: Diagnosis not present

## 2018-06-24 DIAGNOSIS — M810 Age-related osteoporosis without current pathological fracture: Secondary | ICD-10-CM | POA: Diagnosis not present

## 2018-07-03 ENCOUNTER — Emergency Department (HOSPITAL_COMMUNITY): Payer: PPO

## 2018-07-03 ENCOUNTER — Emergency Department (HOSPITAL_COMMUNITY)
Admission: EM | Admit: 2018-07-03 | Discharge: 2018-07-03 | Disposition: A | Discharge status: Home / Self Care | Payer: PPO | Attending: Emergency Medicine | Admitting: Emergency Medicine

## 2018-07-03 ENCOUNTER — Encounter (HOSPITAL_COMMUNITY): Payer: Self-pay | Admitting: Emergency Medicine

## 2018-07-03 DIAGNOSIS — Y939 Activity, unspecified: Secondary | ICD-10-CM | POA: Insufficient documentation

## 2018-07-03 DIAGNOSIS — S199XXA Unspecified injury of neck, initial encounter: Secondary | ICD-10-CM | POA: Diagnosis not present

## 2018-07-03 DIAGNOSIS — Y92129 Unspecified place in nursing home as the place of occurrence of the external cause: Secondary | ICD-10-CM | POA: Insufficient documentation

## 2018-07-03 DIAGNOSIS — R0902 Hypoxemia: Secondary | ICD-10-CM | POA: Diagnosis not present

## 2018-07-03 DIAGNOSIS — E7800 Pure hypercholesterolemia, unspecified: Secondary | ICD-10-CM | POA: Diagnosis not present

## 2018-07-03 DIAGNOSIS — I10 Essential (primary) hypertension: Secondary | ICD-10-CM | POA: Diagnosis not present

## 2018-07-03 DIAGNOSIS — H9192 Unspecified hearing loss, left ear: Secondary | ICD-10-CM | POA: Diagnosis not present

## 2018-07-03 DIAGNOSIS — S0181XA Laceration without foreign body of other part of head, initial encounter: Secondary | ICD-10-CM | POA: Diagnosis not present

## 2018-07-03 DIAGNOSIS — M2550 Pain in unspecified joint: Secondary | ICD-10-CM | POA: Diagnosis not present

## 2018-07-03 DIAGNOSIS — R51 Headache: Secondary | ICD-10-CM | POA: Diagnosis not present

## 2018-07-03 DIAGNOSIS — Z7401 Bed confinement status: Secondary | ICD-10-CM | POA: Diagnosis not present

## 2018-07-03 DIAGNOSIS — F0390 Unspecified dementia without behavioral disturbance: Secondary | ICD-10-CM | POA: Insufficient documentation

## 2018-07-03 DIAGNOSIS — R52 Pain, unspecified: Secondary | ICD-10-CM | POA: Diagnosis not present

## 2018-07-03 DIAGNOSIS — W06XXXA Fall from bed, initial encounter: Secondary | ICD-10-CM | POA: Diagnosis not present

## 2018-07-03 DIAGNOSIS — Z79899 Other long term (current) drug therapy: Secondary | ICD-10-CM | POA: Insufficient documentation

## 2018-07-03 DIAGNOSIS — S0990XA Unspecified injury of head, initial encounter: Secondary | ICD-10-CM | POA: Diagnosis not present

## 2018-07-03 DIAGNOSIS — Y999 Unspecified external cause status: Secondary | ICD-10-CM | POA: Insufficient documentation

## 2018-07-03 DIAGNOSIS — R410 Disorientation, unspecified: Secondary | ICD-10-CM | POA: Diagnosis not present

## 2018-07-03 DIAGNOSIS — W19XXXA Unspecified fall, initial encounter: Secondary | ICD-10-CM | POA: Diagnosis not present

## 2018-07-03 DIAGNOSIS — S79912A Unspecified injury of left hip, initial encounter: Secondary | ICD-10-CM | POA: Diagnosis not present

## 2018-07-03 MED ORDER — ACETAMINOPHEN 325 MG PO TABS: 650.0000 mg | ORAL_TABLET | Freq: Once | ORAL | Status: DC

## 2018-07-03 NOTE — ED Provider Notes (Addendum)
[facility] EMERGENCY DEPARTMENT Provider Note   CSN: [id number] Arrival date & time: 07/03/18  0507    History   Chief Complaint Chief Complaint  Patient presents with  . Fall  . Head Laceration    HPI ROMIE KEEBLE is a 85 y.o. female.     HPI   Level 5 caveat due to dementia.  SHELSEA HANGARTNER is a 85 y.o. female, with a history of depression, HTN, and hyperlipidemia, presenting to the ED for evaluation following a fall.  Patient arrives via EMS from [facility] nursing facility.  She reportedly had a mechanical fall while attempting to sit on her bed.  She struck her forehead on a nightstand. There was reportedly no loss of consciousness or seizure activity. Patient complains of pain in the region of a wound to her central forehead, but denies any other complaints     Past Medical History:  Diagnosis Date  . Anxiety and depression   . Arthritis   . Chronic pain   . Compression fracture of C-spine (HCC)   . Dementia (HCC)   . Hyperlipidemia   . Hypertension   . Macular degeneration   . Osteoporosis     Patient Active Problem List   Diagnosis Date Noted  . Chronic pain syndrome 04/12/2018  . Weakness generalized 04/12/2018  . Advanced dementia (HCC) 04/12/2018  . CAP (community acquired pneumonia) 04/10/2018  . Traumatic compression fracture of T7 thoracic vertebra 08/29/2016  . Traumatic compression fracture of T11 thoracic vertebra (HCC) 08/29/2016  . Compression fracture of body of thoracic vertebra (HCC)   . Generalized abdominal pain   . Malnutrition of moderate degree 08/28/2016  . Acute urinary retention 08/27/2016  . Rib fractures 01/17/2014  . Acute encephalopathy 01/17/2014  . Protein-calorie malnutrition, severe (HCC) 12/15/2013  . Compression fracture of L1 lumbar vertebra (HCC) 12/15/2013  . Hypokalemia 12/15/2013  . Constipation 12/14/2013  . Nausea alone 12/04/2013  . Depression 11/06/2013  . Unspecified  constipation 11/06/2013  . Compression fracture of L4 lumbar vertebra 11/05/2013  . Fracture of vertebra 11/03/2013  . Inguinal hernia, right. 08/09/2011  . Hyperglycemia 03/01/2011  . Seasonal allergies 03/01/2011  . Hyponatremia 02/28/2011  . Normocytic anemia 02/28/2011  . HTN (hypertension) 02/27/2011  . Diskitis 02/27/2011  . Cataract 02/27/2011  . Hyperlipidemia 02/27/2011  . Osteoporosis     Past Surgical History:  Procedure Laterality Date  . APPENDECTOMY    . BACK SURGERY     kyphoplasty  . EYE SURGERY    . IR RADIOLOGIST EVAL & MGMT  10/01/2016     OB History    Gravida  3   Para  3   Term  3   Preterm      AB      Living        SAB      TAB      Ectopic      Multiple      Live Births               Home Medications    Prior to Admission medications   Medication Sig Start Date End Date Taking? Authorizing Provider  acetaminophen (TYLENOL) 650 MG CR tablet Take 650 mg by mouth 3 (three) times daily.    [provider]  buPROPion (WELLBUTRIN XL) 300 MG 24 hr tablet Take 300 mg by mouth daily.  03/27/18   [provider]  calcium citrate-vitamin D (CITRACAL+D) 315-200 MG-UNIT tablet Take 1  tablet by mouth daily with breakfast.    [provider]  carvedilol (COREG) 3.125 MG tablet Take 3.125 mg by mouth 2 (two) times daily with a meal.    [provider]  cholecalciferol (VITAMIN D) 25 MCG (1000 UT) tablet Take 1,000 Units by mouth daily.    [provider]  divalproex (DEPAKOTE SPRINKLE) 125 MG capsule Take 125-250 mg by mouth See admin instructions. Take 2 capsules (250 mg) in the morning, Take 1 capsule (125 mg) at noon, Take 2 capsules (250 mg) at 8 pm. HOLD FOR SEDATION    [provider]  docusate sodium (COLACE) 250 MG capsule Take 250 mg by mouth daily.    [provider]  donepezil (ARICEPT) 10 MG tablet Take 10 mg by mouth at bedtime.     [provider]  famotidine  (PEPCID) 20 MG tablet Take 1 tablet (20 mg total) by mouth daily. 09/01/16   Rolly Salter, MD  furosemide (LASIX) 20 MG tablet Take 1 tablet (20 mg total) by mouth daily. 03/31/17   Dione Booze, MD  gabapentin (NEURONTIN) 300 MG capsule Take 300-600 mg by mouth See admin instructions. Take 2 capsules (600 mg) in the morning, Take 1 capsule (300 mg) at 1 pm and Take 2 capsules (600 mg) at bedtime.    [provider]  loratadine (CLARITIN) 10 MG tablet Take 10 mg by mouth daily as needed for allergies.     [provider]  LORazepam (ATIVAN) 0.5 MG tablet Take 0.5 mg by mouth 2 (two) times daily.     [provider]  LORazepam (ATIVAN) 0.5 MG tablet Take 0.5 mg by mouth 2 (two) times daily as needed (agitation).    [provider]  losartan (COZAAR) 100 MG tablet Take 100 mg by mouth daily with breakfast.  07/12/11   [provider]  Melatonin 3 MG TABS Take 3 mg by mouth at bedtime.     [provider]  Menthol, Topical Analgesic, (BIOFREEZE) 4 % GEL Apply 1 application topically 3 (three) times daily. Apply to back and hip    [provider]  Multiple Vitamins-Minerals (OCUVITE PRESERVISION PO) Take 1 tablet by mouth daily as needed (nutrition).     [provider]  ondansetron (ZOFRAN) 4 MG tablet Take 4 mg by mouth every 8 (eight) hours as needed for nausea or vomiting.    [provider]  polyethylene glycol (CLEARLAX) packet Take 17 g by mouth daily.     [provider]  potassium chloride SA (K-DUR,KLOR-CON) 20 MEQ tablet Take 20 mEq by mouth daily.    [provider]  Probiotic Product (PROBIOTIC PO) Take 1 capsule by mouth daily.    [provider]  senna (SENOKOT) 8.6 MG tablet Take 1 tablet by mouth 2 (two) times daily as needed for constipation.    [provider]  Simethicone 125 MG TABS Take 125 mg by mouth every 8 (eight) hours as needed (for gas).     [provider]  traMADol (ULTRAM) 50 MG tablet Take 0.5 tablets (25 mg total) by mouth every 6 (six) hours as needed for severe pain. 08/31/16   Rolly Salter, MD    Family History Family History  Problem Relation Age of Onset  . Cancer Daughter        breast    Social History Social History   Tobacco Use  . Smoking status: Never Smoker  . Smokeless tobacco: Never Used  Substance Use Topics  . Alcohol use: No  . Drug use: No     Allergies   Macrobid [nitrofurantoin]   Review of Systems Review of Systems  Unable to perform ROS: Dementia     Physical Exam Updated Vital Signs BP (!) 142/53   Pulse (!) 59   Temp (!) 97.4 F (36.3 C) (Oral)   Resp 11   SpO2 96%   Physical Exam Vitals signs and nursing note reviewed.  Constitutional:      General: She is not in acute distress.    Appearance: She is well-developed. She is not diaphoretic.  HENT:     Head: Normocephalic.     Comments: Area of swelling and bruising to the central forehead at the hairline.  0.25 cm laceration overlying the region of swelling.  No noted instability or deformity.  Small amount of dried blood from the left nare.  No septal hematoma.  No swelling, tenderness, deformity, or instability noted to the nose.  Nares appear to be patent.  The remainder of the scalp and face was examined without evidence of swelling, wound, deformity, color change, or instability.    Mouth/Throat:     Mouth: Mucous membranes are moist.     Pharynx: Oropharynx is clear.  Eyes:     Extraocular Movements: Extraocular movements intact.     Conjunctiva/sclera: Conjunctivae normal.     Pupils: Pupils are equal, round, and reactive to light.  Neck:     Musculoskeletal: Neck supple.  Cardiovascular:     Rate and Rhythm: Normal rate and regular rhythm.     Pulses: Normal pulses.          Radial pulses are 2+ on the right side and 2+ on the left side.       Posterior tibial pulses are 2+ on the right side and 2+ on the left  side.     Heart sounds: Normal heart sounds.     Comments: Tactile temperature in the extremities appropriate and equal bilaterally. Pulmonary:     Effort: Pulmonary effort is normal. No respiratory distress.     Breath sounds: Normal breath sounds.  Abdominal:     Palpations: Abdomen is soft.     Tenderness: There is no abdominal tenderness. There is no guarding.  Musculoskeletal:     Right lower leg: No edema.     Left lower leg: No edema.     Comments: Each of the patient's joints were ranged without noted deformity, swelling, instability, or pain.  Normal motor function intact in all extremities. No midline spinal tenderness.  No step-off, swelling, or deformity over the spine.  Lymphadenopathy:     Cervical: No cervical adenopathy.  Skin:    General: Skin is warm and dry.     Comments: Skin tear left anterior lower leg.  No noted deformity, instability, swelling, color change, or surrounding tenderness.  Neurological:     Mental Status: She is alert.     Comments: Sensation to light touch seems to be grossly intact in each of the extremities. Patient moves each of the extremities. She is overall able to follow commands.  Psychiatric:        Mood and Affect: Mood and affect normal.        Speech: Speech normal.        Behavior: Behavior normal.      ED Treatments / Results  Labs (all labs ordered are listed, but only abnormal results are displayed) Labs Reviewed - No  data to display  EKG None  Radiology Ct Head Wo Contrast  Result Date: 07/03/2018 CLINICAL DATA:  Mechanical fall getting out of bed. Initial encounter. EXAM: CT HEAD WITHOUT CONTRAST CT CERVICAL SPINE WITHOUT CONTRAST TECHNIQUE: Multidetector CT imaging of the head and cervical spine was performed following the standard protocol without intravenous contrast. Multiplanar CT image reconstructions of the cervical spine were also generated. COMPARISON:  12/01/2017 FINDINGS: CT HEAD FINDINGS Brain: No evidence  of acute infarction, hemorrhage, hydrocephalus, extra-axial collection or mass lesion/mass effect. Atrophy correlating with history of dementia. Chronic small vessel ischemia in the periventricular white matter. Vascular: Atherosclerotic calcification Skull: Forehead hematoma without calvarial fracture Sinuses/Orbits: Bilateral cataract resection CT CERVICAL SPINE FINDINGS Alignment: No traumatic malalignment. 4 mm of degenerative anterolisthesis at C4-5. Mild C3-4 anterolisthesis. Skull base and vertebrae: Negative for acute fracture Soft tissues and spinal canal: No prevertebral fluid or swelling. No visible canal hematoma. Right thyroid nodule that is considered insignificant given comorbidities Disc levels: Degenerative disc narrowing that is advanced at C4-5 to C6-7. Degenerative facet spurring superiorly. Upper chest: Negative IMPRESSION: No evidence of acute intracranial or cervical spine injury. Electronically Signed   By: Marnee Spring M.D.   On: 07/03/2018 06:23   Ct Cervical Spine Wo Contrast  Result Date: 07/03/2018 CLINICAL DATA:  Mechanical fall getting out of bed. Initial encounter. EXAM: CT HEAD WITHOUT CONTRAST CT CERVICAL SPINE WITHOUT CONTRAST TECHNIQUE: Multidetector CT imaging of the head and cervical spine was performed following the standard protocol without intravenous contrast. Multiplanar CT image reconstructions of the cervical spine were also generated. COMPARISON:  12/01/2017 FINDINGS: CT HEAD FINDINGS Brain: No evidence of acute infarction, hemorrhage, hydrocephalus, extra-axial collection or mass lesion/mass effect. Atrophy correlating with history of dementia. Chronic small vessel ischemia in the periventricular white matter. Vascular: Atherosclerotic calcification Skull: Forehead hematoma without calvarial fracture Sinuses/Orbits: Bilateral cataract resection CT CERVICAL SPINE FINDINGS Alignment: No traumatic malalignment. 4 mm of degenerative anterolisthesis at C4-5. Mild C3-4  anterolisthesis. Skull base and vertebrae: Negative for acute fracture Soft tissues and spinal canal: No prevertebral fluid or swelling. No visible canal hematoma. Right thyroid nodule that is considered insignificant given comorbidities Disc levels: Degenerative disc narrowing that is advanced at C4-5 to C6-7. Degenerative facet spurring superiorly. Upper chest: Negative IMPRESSION: No evidence of acute intracranial or cervical spine injury. Electronically Signed   By: Marnee Spring M.D.   On: 07/03/2018 06:23    Procedures .Marland KitchenLaceration Repair Date/Time: 07/03/2018 6:35 AM Performed by: Anselm Pancoast, PA-C Authorized by: Anselm Pancoast, PA-C     (including critical care time)  Medications Ordered in ED Medications - No data to display   Initial Impression / Assessment and Plan / ED Course  I have reviewed the triage vital signs and the nursing notes.  Pertinent labs & imaging results that were available during my care of the patient were reviewed by me and considered in my medical decision making (see chart for details).  Clinical Course as of Jul 02 705  Thu Jul 03, 2018  0543 Attempted to call patient's listed point of contact, Starleen Arms (daughter). Left VM.   [SJ]  [id number] Patient previously not complaining of hip pain and no indication of pain with previous exam. Patient now complaining of pain to the left hip. Lateral tenderness.    [SJ]    Clinical Course User Index [SJ] Anselm Pancoast, PA-C       Patient arrives from a skilled nursing facility following a mechanical fall. I  attempted to contact the patient's listed point of contact to discuss options for evaluation here in the ED.  I was unable to successfully make this contact, therefore we went forward with the evaluation.  No acute abnormalities on CT of the head or cervical spine.  Findings and plan of care discussed with Drema PryPedro Cardama, MD.   When patient was about to be discharged, she began to complain of left hip  pain.  Upon repeat exam, she had tenderness to the left lateral hip without other abnormality noted.  Discharge was delayed and order for x-ray of the hip was placed.  End of shift patient care handoff report given to Sharen Hecklaudia Gibbons, PA-C. Plan: Follow-up on pending hip x-ray.  If no acute abnormality, proceed with discharge.  Vitals:   07/03/18 0512 07/03/18 0615  BP: (!) 163/74 (!) 142/53  Pulse: 66 (!) 59  Resp: 18 11  Temp: (!) 97.4 F (36.3 C)   TempSrc: Oral   SpO2: 95% 96%     Final Clinical Impressions(s) / ED Diagnoses   Final diagnoses:  Injury of head, initial encounter  Laceration of forehead, initial encounter    ED Discharge Orders    None       Concepcion LivingJoy, Shawn C, PA-C 07/03/18 0718    Anselm PancoastJoy, Shawn C, PA-C 07/03/18 0719    Nira Connardama, Pedro Eduardo, MD 07/05/18 [phone]

## 2018-07-03 NOTE — ED Provider Notes (Signed)
0730: Hand off from previous EDPA. See previous note for full details.  Pt awaiting left hip x-ray for report of new left hip pain on re-peat evaluation.  Low suspicion for fracture.  Anticipate discharge. Will plan on calling family, attempt to ambulate. VS WNL.  Physical Exam  BP (!) 153/76 (BP Location: Left Arm)   Pulse 75   Temp (!) 97.4 F (36.3 C) (Oral)   Resp 20   SpO2 99%   Physical Exam Constitutional:      Appearance: She is well-developed. She is not toxic-appearing.  HENT:     Head: Normocephalic.     Right Ear: External ear normal.     Left Ear: External ear normal.     Nose: Nose normal.  Eyes:     Conjunctiva/sclera: Conjunctivae normal.  Neck:     Musculoskeletal: Full passive range of motion without pain.  Cardiovascular:     Rate and Rhythm: Regular rhythm. Bradycardia present.     Pulses: Normal pulses.     Comments: 1+ radial and DP pulses bilaterally  Pulmonary:     Effort: Pulmonary effort is normal. No tachypnea.     Breath sounds: Normal breath sounds.  Musculoskeletal: Normal range of motion.     Comments: No tenderness to anterior/lateral prominences of left hip. No leg shortening or rotation. Pelvis stable.   Skin:    General: Skin is warm and dry.     Capillary Refill: Capillary refill takes less than 2 seconds.  Neurological:     Mental Status: She is alert and oriented to person, place, and time.  Psychiatric:        Behavior: Behavior normal.        Thought Content: Thought content normal.     ED Course/Procedures   Clinical Course as of Jul 03 811  Thu Jul 03, 2018  0543 Attempted to call patient's listed point of contact, Starleen Arms (daughter). Left VM.   [SJ]  [id number] Patient previously not complaining of hip pain and no indication of pain with previous exam. Patient now complaining of pain to the left hip. Lateral tenderness.    [SJ]    Clinical Course User Index [SJ] [facility]    Procedures  MDM    [phone]: x-ray  negative. Attempted to call daughter Pam x 2 and left VM. Pt tolerating PO and in NAD on re-evaluation. Appropriate for discharge via [facility] to SNF.    Liberty Handy, PA-C 07/03/18 0813    Terrilee Files, MD 07/03/18 [phone]

## 2018-07-03 NOTE — ED Notes (Signed)
Patient requesting to urinate at this time, purewick placed on patient.

## 2018-07-03 NOTE — ED Notes (Signed)
[facility] at bedside to transport patient back to [facility]

## 2018-07-03 NOTE — ED Notes (Signed)
Patient transported to X-ray 

## 2018-07-03 NOTE — ED Notes (Signed)
Called [facility] 

## 2018-07-03 NOTE — Discharge Instructions (Signed)
Wound Care - Dermabond  Your wound has been closed with a medical-grade glue called Dermabond. Please adhere to the following wound care instructions:  You may shower, but avoid submerging the wound. Do not scrub the wound, as this may cause the glue to wear off prematurely.    The glue will wear off on its own, usually within 5-10 days. During this time period DO NOT apply antibiotic ointments or any other ointments or lotions to the area as this can cause the glue to wear off prematurely.  After 10 days, you may apply ointments, such as Neosporin, to the area to help the remaining glue to wear off.   After the wound has healed and the glue is gone, you may apply ointments such as Aquaphor to the wound to reduce scarring.  May use Tylenol for pain.  Return to the ED should the wound edges come apart or signs of infection arise, such as spreading redness, puffiness/swelling, pus draining from the wound, severe increase in pain, or any other major issues.   Follow up: Follow up with the concussion clinic or your primary care provider for further management of this issue. Return: Return to the ED should you begin to have confusion, abnormal behavior, aggression, violence, or personality changes, repeated vomiting, vision loss, numbness or weakness on one side of the body, difficulty standing due to dizziness, significantly worsening pain, or any other major concerns.

## 2018-07-03 NOTE — ED Notes (Signed)
Patient transported to CT 

## 2018-07-03 NOTE — ED Notes (Signed)
[facility] arrived to transport patient. Harolyn Rutherford PA spoke with [facility] and advised that patient needs to go to xray prior to leaving. [facility] requested we call them back once patient is ready for discharge.

## 2018-07-03 NOTE — ED Triage Notes (Addendum)
Pt arrives via [facility] from [facility] where she had a mechanical fall when attempting to sit down on her bed. No LOC, hematoma present to medial forehead and various skin tears to legs. Ccollar in place. Hx of dementia, oriented to person and place. vss bp 134/76 hr 74 rr18 O2 98% on 2L, CBG 121. DNR arrived with pt and placed at bedside. Patient not reportedly on any anticoags.

## 2018-08-18 ENCOUNTER — Other Ambulatory Visit: Payer: Self-pay

## 2018-08-18 ENCOUNTER — Emergency Department (HOSPITAL_COMMUNITY): Payer: PPO

## 2018-08-18 ENCOUNTER — Inpatient Hospital Stay (HOSPITAL_COMMUNITY): Payer: PPO

## 2018-08-18 ENCOUNTER — Encounter (HOSPITAL_COMMUNITY): Payer: Self-pay | Admitting: Pharmacy Technician

## 2018-08-18 ENCOUNTER — Inpatient Hospital Stay (HOSPITAL_COMMUNITY)
Admission: EM | Admit: 2018-08-18 | Discharge: 2018-08-22 | DRG: 871 | Disposition: A | Discharge status: Hospice | Payer: PPO | Attending: Internal Medicine | Admitting: Internal Medicine

## 2018-08-18 DIAGNOSIS — N179 Acute kidney failure, unspecified: Secondary | ICD-10-CM | POA: Diagnosis not present

## 2018-08-18 DIAGNOSIS — E785 Hyperlipidemia, unspecified: Secondary | ICD-10-CM | POA: Diagnosis not present

## 2018-08-18 DIAGNOSIS — A419 Sepsis, unspecified organism: Secondary | ICD-10-CM | POA: Diagnosis not present

## 2018-08-18 DIAGNOSIS — Z66 Do not resuscitate: Secondary | ICD-10-CM | POA: Diagnosis present

## 2018-08-18 DIAGNOSIS — Z20828 Contact with and (suspected) exposure to other viral communicable diseases: Secondary | ICD-10-CM | POA: Diagnosis present

## 2018-08-18 DIAGNOSIS — F0390 Unspecified dementia without behavioral disturbance: Secondary | ICD-10-CM | POA: Diagnosis not present

## 2018-08-18 DIAGNOSIS — Z79899 Other long term (current) drug therapy: Secondary | ICD-10-CM

## 2018-08-18 DIAGNOSIS — M4850XS Collapsed vertebra, not elsewhere classified, site unspecified, sequela of fracture: Secondary | ICD-10-CM | POA: Diagnosis not present

## 2018-08-18 DIAGNOSIS — L89152 Pressure ulcer of sacral region, stage 2: Secondary | ICD-10-CM | POA: Diagnosis present

## 2018-08-18 DIAGNOSIS — R4182 Altered mental status, unspecified: Secondary | ICD-10-CM | POA: Diagnosis not present

## 2018-08-18 DIAGNOSIS — R4020 Unspecified coma: Secondary | ICD-10-CM | POA: Diagnosis not present

## 2018-08-18 DIAGNOSIS — E872 Acidosis: Secondary | ICD-10-CM | POA: Diagnosis not present

## 2018-08-18 DIAGNOSIS — Z515 Encounter for palliative care: Secondary | ICD-10-CM | POA: Diagnosis present

## 2018-08-18 DIAGNOSIS — A4151 Sepsis due to Escherichia coli [E. coli]: Secondary | ICD-10-CM | POA: Diagnosis not present

## 2018-08-18 DIAGNOSIS — R404 Transient alteration of awareness: Secondary | ICD-10-CM | POA: Diagnosis not present

## 2018-08-18 DIAGNOSIS — Z888 Allergy status to other drugs, medicaments and biological substances status: Secondary | ICD-10-CM | POA: Diagnosis not present

## 2018-08-18 DIAGNOSIS — Z881 Allergy status to other antibiotic agents status: Secondary | ICD-10-CM | POA: Diagnosis not present

## 2018-08-18 DIAGNOSIS — R6521 Severe sepsis with septic shock: Secondary | ICD-10-CM | POA: Diagnosis present

## 2018-08-18 DIAGNOSIS — L8990 Pressure ulcer of unspecified site, unspecified stage: Secondary | ICD-10-CM

## 2018-08-18 DIAGNOSIS — Z7401 Bed confinement status: Secondary | ICD-10-CM | POA: Diagnosis not present

## 2018-08-18 DIAGNOSIS — R0682 Tachypnea, not elsewhere classified: Secondary | ICD-10-CM | POA: Diagnosis not present

## 2018-08-18 DIAGNOSIS — I10 Essential (primary) hypertension: Secondary | ICD-10-CM | POA: Diagnosis present

## 2018-08-18 DIAGNOSIS — G8929 Other chronic pain: Secondary | ICD-10-CM | POA: Diagnosis not present

## 2018-08-18 DIAGNOSIS — F419 Anxiety disorder, unspecified: Secondary | ICD-10-CM | POA: Diagnosis present

## 2018-08-18 DIAGNOSIS — X58XXXA Exposure to other specified factors, initial encounter: Secondary | ICD-10-CM | POA: Diagnosis not present

## 2018-08-18 DIAGNOSIS — R319 Hematuria, unspecified: Secondary | ICD-10-CM | POA: Diagnosis not present

## 2018-08-18 DIAGNOSIS — I959 Hypotension, unspecified: Secondary | ICD-10-CM | POA: Diagnosis not present

## 2018-08-18 DIAGNOSIS — R8281 Pyuria: Secondary | ICD-10-CM | POA: Diagnosis not present

## 2018-08-18 DIAGNOSIS — S0081XA Abrasion of other part of head, initial encounter: Secondary | ICD-10-CM | POA: Diagnosis not present

## 2018-08-18 DIAGNOSIS — R0689 Other abnormalities of breathing: Secondary | ICD-10-CM | POA: Diagnosis not present

## 2018-08-18 DIAGNOSIS — A4159 Other Gram-negative sepsis: Secondary | ICD-10-CM | POA: Diagnosis present

## 2018-08-18 DIAGNOSIS — R000 Tachycardia, unspecified: Secondary | ICD-10-CM | POA: Diagnosis not present

## 2018-08-18 DIAGNOSIS — R5381 Other malaise: Secondary | ICD-10-CM | POA: Diagnosis not present

## 2018-08-18 DIAGNOSIS — M2550 Pain in unspecified joint: Secondary | ICD-10-CM | POA: Diagnosis not present

## 2018-08-18 LAB — CBC WITH DIFFERENTIAL/PLATELET
Abs Immature Granulocytes: 0 10*3/uL (ref 0.00–0.07)
Basophils Absolute: 0.1 10*3/uL (ref 0.0–0.1)
Basophils Relative: 2 %
Eosinophils Absolute: 0 10*3/uL (ref 0.0–0.5)
Eosinophils Relative: 0 %
HCT: 39.4 % (ref 36.0–46.0)
Hemoglobin: 11.8 g/dL — ABNORMAL LOW (ref 12.0–15.0)
Lymphocytes Relative: 7 %
Lymphs Abs: 0.2 10*3/uL — ABNORMAL LOW (ref 0.7–4.0)
MCH: 30.1 pg (ref 26.0–34.0)
MCHC: 29.9 g/dL — ABNORMAL LOW (ref 30.0–36.0)
MCV: 100.5 fL — ABNORMAL HIGH (ref 80.0–100.0)
Monocytes Absolute: 0 10*3/uL — ABNORMAL LOW (ref 0.1–1.0)
Monocytes Relative: 0 %
Neutro Abs: 2.3 10*3/uL (ref 1.7–7.7)
Neutrophils Relative %: 91 %
Platelets: 177 10*3/uL (ref 150–400)
RBC: 3.92 MIL/uL (ref 3.87–5.11)
RDW: 14.5 % (ref 11.5–15.5)
WBC: 2.5 10*3/uL — ABNORMAL LOW (ref 4.0–10.5)
nRBC: 0 % (ref 0.0–0.2)
nRBC: 0 /100 WBC

## 2018-08-18 LAB — POCT I-STAT 7, (LYTES, BLD GAS, ICA,H+H)
Acid-base deficit: 4 mmol/L — ABNORMAL HIGH (ref 0.0–2.0)
Bicarbonate: 20.4 mmol/L (ref 20.0–28.0)
Calcium, Ion: 1.24 mmol/L (ref 1.15–1.40)
HCT: 33 % — ABNORMAL LOW (ref 36.0–46.0)
Hemoglobin: 11.2 g/dL — ABNORMAL LOW (ref 12.0–15.0)
O2 Saturation: 85 %
Potassium: 4.4 mmol/L (ref 3.5–5.1)
Sodium: 138 mmol/L (ref 135–145)
TCO2: 21 mmol/L — ABNORMAL LOW (ref 22–32)
pCO2 arterial: 33.8 mmHg (ref 32.0–48.0)
pH, Arterial: 7.39 (ref 7.350–7.450)
pO2, Arterial: 49 mmHg — ABNORMAL LOW (ref 83.0–108.0)

## 2018-08-18 LAB — URINALYSIS, ROUTINE W REFLEX MICROSCOPIC
Bilirubin Urine: NEGATIVE
Glucose, UA: NEGATIVE mg/dL
Ketones, ur: NEGATIVE mg/dL
Leukocytes,Ua: NEGATIVE
Nitrite: NEGATIVE
Protein, ur: 30 mg/dL — AB
Specific Gravity, Urine: 1.016 (ref 1.005–1.030)
pH: 5 (ref 5.0–8.0)

## 2018-08-18 LAB — COMPREHENSIVE METABOLIC PANEL
ALT: 39 U/L (ref 0–44)
AST: 69 U/L — ABNORMAL HIGH (ref 15–41)
Albumin: 2.9 g/dL — ABNORMAL LOW (ref 3.5–5.0)
Alkaline Phosphatase: 68 U/L (ref 38–126)
Anion gap: 11 (ref 5–15)
BUN: 27 mg/dL — ABNORMAL HIGH (ref 8–23)
CO2: 23 mmol/L (ref 22–32)
Calcium: 9 mg/dL (ref 8.9–10.3)
Chloride: 104 mmol/L (ref 98–111)
Creatinine, Ser: 1.14 mg/dL — ABNORMAL HIGH (ref 0.44–1.00)
GFR calc Af Amer: 47 mL/min — ABNORMAL LOW (ref >60)
GFR calc non Af Amer: 41 mL/min — ABNORMAL LOW (ref >60)
Glucose, Bld: 123 mg/dL — ABNORMAL HIGH (ref 70–99)
Potassium: 4.5 mmol/L (ref 3.5–5.1)
Sodium: 138 mmol/L (ref 135–145)
Total Bilirubin: 0.4 mg/dL (ref 0.3–1.2)
Total Protein: 5.7 g/dL — ABNORMAL LOW (ref 6.5–8.1)

## 2018-08-18 LAB — LACTIC ACID, PLASMA
Lactic Acid, Venous: 5.3 mmol/L (ref 0.5–1.9)
Lactic Acid, Venous: 5.4 mmol/L (ref 0.5–1.9)

## 2018-08-18 LAB — SARS CORONAVIRUS 2 BY RT PCR (HOSPITAL ORDER, PERFORMED IN ~~LOC~~ HOSPITAL LAB): SARS Coronavirus 2: NEGATIVE

## 2018-08-18 MED ORDER — METRONIDAZOLE IN NACL 5-0.79 MG/ML-% IV SOLN
500.0000 mg | Freq: Once | INTRAVENOUS | Status: AC
  Administered 2018-08-18: 500 mg via INTRAVENOUS
  Filled 2018-08-18: qty 100

## 2018-08-18 MED ORDER — LACTATED RINGERS IV BOLUS (SEPSIS)
250.0000 mL | Freq: Once | INTRAVENOUS | Status: AC
  Administered 2018-08-18: 250 mL via INTRAVENOUS

## 2018-08-18 MED ORDER — ACETAMINOPHEN 325 MG PO TABS: 650.0000 mg | ORAL_TABLET | Freq: Four times a day (QID) | ORAL | Status: DC | PRN

## 2018-08-18 MED ORDER — LACTATED RINGERS IV BOLUS (SEPSIS)
1000.0000 mL | Freq: Once | INTRAVENOUS | Status: AC
  Administered 2018-08-18: 1000 mL via INTRAVENOUS

## 2018-08-18 MED ORDER — LACTATED RINGERS IV BOLUS (SEPSIS)
250.0000 mL | Freq: Once | INTRAVENOUS | Status: AC
  Administered 2018-08-18: via INTRAVENOUS

## 2018-08-18 MED ORDER — ACETAMINOPHEN 650 MG RE SUPP
650.0000 mg | Freq: Once | RECTAL | Status: AC
  Administered 2018-08-18: 650 mg via RECTAL
  Filled 2018-08-18: qty 1

## 2018-08-18 MED ORDER — SODIUM CHLORIDE 0.9 % IV SOLN
2.0000 g | Freq: Once | INTRAVENOUS | Status: AC
  Administered 2018-08-18: 2 g via INTRAVENOUS
  Filled 2018-08-18: qty 2

## 2018-08-18 MED ORDER — VANCOMYCIN HCL IN DEXTROSE 1-5 GM/200ML-% IV SOLN
1000.0000 mg | Freq: Once | INTRAVENOUS | Status: AC
  Administered 2018-08-18: 1000 mg via INTRAVENOUS
  Filled 2018-08-18: qty 200

## 2018-08-18 MED ORDER — ONDANSETRON HCL 4 MG/2ML IJ SOLN: 4.0000 mg | Freq: Four times a day (QID) | INTRAMUSCULAR | Status: DC | PRN

## 2018-08-18 MED ORDER — VANCOMYCIN HCL IN DEXTROSE 750-5 MG/150ML-% IV SOLN: 750.0000 mg | INTRAVENOUS | Status: DC

## 2018-08-18 MED ORDER — SODIUM CHLORIDE 0.9 % IV SOLN: 2.0000 g | INTRAVENOUS | Status: DC

## 2018-08-18 MED ORDER — LACTATED RINGERS IV BOLUS
500.0000 mL | Freq: Once | INTRAVENOUS | Status: AC
  Administered 2018-08-18: 500 mL via INTRAVENOUS

## 2018-08-18 MED ORDER — ONDANSETRON HCL 4 MG PO TABS: 4.0000 mg | ORAL_TABLET | Freq: Four times a day (QID) | ORAL | Status: DC | PRN

## 2018-08-18 MED ORDER — ACETAMINOPHEN 650 MG RE SUPP
650.0000 mg | Freq: Four times a day (QID) | RECTAL | Status: DC | PRN
  Administered 2018-08-19: 650 mg via RECTAL
  Filled 2018-08-18: qty 1

## 2018-08-18 MED ORDER — ENOXAPARIN SODIUM 30 MG/0.3ML ~~LOC~~ SOLN
30.0000 mg | Freq: Every day | SUBCUTANEOUS | Status: DC
  Administered 2018-08-19: 30 mg via SUBCUTANEOUS
  Filled 2018-08-18: qty 0.3

## 2018-08-18 NOTE — ED Provider Notes (Signed)
[facility] EMERGENCY DEPARTMENT Provider Note   CSN: [id number] Arrival date & time: 08/18/18  1756  LEVEL 5 CAVEAT - ALTERED MENTAL STATUS   History   Chief Complaint Chief Complaint  Patient presents with  . unresponsive    HPI Nancy Meyer is a 85 y.o. female.     HPI  85 year old female presents from nursing home with altered mental status.  Per report from EMS, patient was at her baseline at 4 PM and then all of a sudden altered. Is from [facility]. Otherwise history very limited, patient is altered and does not speak to me  I talked to son over the phone, Nancy Meyer, who notes that she has had a steady decline over the last several weeks to months.  Is much less verbal and gets around in a wheelchair.  When discussing plan of care, he confirms she is DO NOT RESUSCITATE.  He does not think she would want something aggressive such as pressors but he would like fluids and antibiotics and see if this improves her symptoms and condition.  History reviewed. No pertinent past medical history.  Patient Active Problem List   Diagnosis Date Noted  . Sepsis (HCC) 08/18/2018       OB History   No obstetric history on file.      Home Medications    Prior to Admission medications   Medication Sig Start Date End Date Taking? Authorizing Provider  acetaminophen (TYLENOL) 650 MG CR tablet Take 650 mg by mouth 3 (three) times daily.   Yes [provider]  buPROPion (WELLBUTRIN XL) 300 MG 24 hr tablet Take 300 mg by mouth daily.   Yes [provider]  calcium citrate-vitamin D (CITRACAL+D) 315-200 MG-UNIT tablet Take 1 tablet by mouth daily with breakfast.   Yes [provider]  carvedilol (COREG) 3.125 MG tablet Take 3.125 mg by mouth 2 (two) times daily with a meal.   Yes [provider]  cholecalciferol (VITAMIN D3) 25 MCG (1000 UT) tablet Take 1,000 Units by mouth daily with breakfast.   Yes [provider]  divalproex (DEPAKOTE SPRINKLE) 125 MG capsule Take 125-250 mg by mouth See admin instructions. Take 2 capsules (250 mg) by mouth every morning, take 1 capsule (125 mg) at noon, and take 2 capsules (250 mg) at 8pm   Yes [provider]  docusate sodium (COLACE) 250 MG capsule Take 250 mg by mouth daily.   Yes [provider]  donepezil (ARICEPT) 10 MG tablet Take 10 mg by mouth at bedtime.   Yes [provider]  famotidine (PEPCID) 40 MG tablet Take 40 mg by mouth daily.   Yes [provider]  ferrous gluconate (FERGON) 240 (27 FE) MG tablet Take 240 mg by mouth daily.   Yes [provider]  furosemide (LASIX) 20 MG tablet Take 20 mg by mouth daily.   Yes [provider]  gabapentin (NEURONTIN) 300 MG capsule Take 300-600 mg by mouth See admin instructions. Take 2 capsules (600 mg) by mouth every morning, 1 capsule (300 mg) at 1pm and 2 capsules (600 mg) at bedtime   Yes [provider]  Liniments (SALONPAS EX) Place 1 patch onto the skin every 8 (eight) hours as needed (pain).   Yes [provider]  loratadine (CLARITIN) 10 MG tablet Take 10 mg by mouth daily as needed for allergies.   Yes [provider]  LORazepam (ATIVAN) 0.5 MG tablet Take 0.5 mg by mouth  3 (three) times daily.   Yes [provider]  losartan (COZAAR) 100 MG tablet Take 100 mg by mouth daily with breakfast.   Yes [provider]  Melatonin 3 MG TABS Take 3 mg by mouth at bedtime.   Yes [provider]  Menthol, Topical Analgesic, (BIOFREEZE) 4 % GEL Apply 1 application topically 3 (three) times daily. Apply to back and hip   Yes [provider]  Multiple Vitamins-Minerals (OCUVITE PRESERVISION PO) Take 1 tablet by mouth daily as needed (supplement).   Yes [provider]  nystatin cream (MYCOSTATIN) Apply 1 application topically 2 (two) times daily as needed (rash). Apply under left breast   Yes  [provider]  OVER THE COUNTER MEDICATION Take 1 capsule by mouth daily. Probiotic formula veggie capsule 1B-250 mg   Yes [provider]  polyethylene glycol (MIRALAX / GLYCOLAX) 17 g packet Take 17 g by mouth daily.   Yes [provider]  potassium chloride SA (K-DUR) 20 MEQ tablet Take 20 mEq by mouth daily.   Yes [provider]  Psyllium (METAMUCIL PO) Take 1 packet by mouth daily with breakfast.   Yes [provider]  senna (SENOKOT) 8.6 MG TABS tablet Take 1 tablet by mouth 2 (two) times daily as needed (constipation).   Yes [provider]  simethicone (MYLICON) 125 MG chewable tablet Chew 125 mg by mouth every 8 (eight) hours as needed for flatulence. GasX   Yes [provider]  traMADol (ULTRAM) 50 MG tablet Take 25 mg by mouth every 6 (six) hours as needed for severe pain.   Yes [provider]    Family History No family history on file.  Social History Social History   Tobacco Use  . Smoking status: Not on file  Substance Use Topics  . Alcohol use: Not on file  . Drug use: Not on file     Allergies   Macrobid [nitrofurantoin macrocrystal]   Review of Systems Review of Systems  Unable to perform ROS: Mental status change     Physical Exam Updated Vital Signs BP (!) 72/58   Pulse (!) 102   Temp 98.9 F (37.2 C) (Oral)   Resp (!) 24   Ht 5' (1.524 m)   Wt 54.4 kg   SpO2 99%   BMI 23.44 kg/m   Physical Exam Vitals signs and nursing note reviewed.  Constitutional:      Appearance: She is well-developed. She is cachectic. She is ill-appearing. She is not diaphoretic.  HENT:     Head: Normocephalic and atraumatic.     Right Ear: External ear normal.     Left Ear: External ear normal.     Nose: Nose normal.  Eyes:     General:        Right eye: No discharge.        Left eye: No discharge.  Cardiovascular:     Rate and Rhythm: Normal rate and regular rhythm.     Heart sounds:  Normal heart sounds.  Pulmonary:     Effort: Pulmonary effort is normal. Tachypnea present.     Breath sounds: Normal breath sounds. No wheezing or rales.  Abdominal:     Palpations: Abdomen is soft.     Tenderness: There is no abdominal tenderness.  Skin:    General: Skin is warm and dry.  Neurological:     Mental Status: She is lethargic.     Comments: All 4 extremities are stiff and  resist movements  Psychiatric:        Mood and Affect: Mood is not anxious.      ED Treatments / Results  Labs (all labs ordered are listed, but only abnormal results are displayed) Labs Reviewed  LACTIC ACID, PLASMA - Abnormal; Notable for the following components:      Result Value   Lactic Acid, Venous 5.3 (*)    All other components within normal limits  LACTIC ACID, PLASMA - Abnormal; Notable for the following components:   Lactic Acid, Venous 5.4 (*)    All other components within normal limits  COMPREHENSIVE METABOLIC PANEL - Abnormal; Notable for the following components:   Glucose, Bld 123 (*)    BUN 27 (*)    Creatinine, Ser 1.14 (*)    Total Protein 5.7 (*)    Albumin 2.9 (*)    AST 69 (*)    GFR calc non Af Amer 41 (*)    GFR calc Af Amer 47 (*)    All other components within normal limits  CBC WITH DIFFERENTIAL/PLATELET - Abnormal; Notable for the following components:   WBC 2.5 (*)    Hemoglobin 11.8 (*)    MCV 100.5 (*)    MCHC 29.9 (*)    Lymphs Abs 0.2 (*)    Monocytes Absolute 0.0 (*)    All other components within normal limits  URINALYSIS, ROUTINE W REFLEX MICROSCOPIC - Abnormal; Notable for the following components:   APPearance HAZY (*)    Hgb urine dipstick SMALL (*)    Protein, ur 30 (*)    Bacteria, UA RARE (*)    All other components within normal limits  POCT I-STAT 7, (LYTES, BLD GAS, ICA,H+H) - Abnormal; Notable for the following components:   pO2, Arterial 49.0 (*)    TCO2 21 (*)    Acid-base deficit 4.0 (*)    HCT 33.0 (*)    Hemoglobin 11.2 (*)     All other components within normal limits  SARS CORONAVIRUS 2 (HOSPITAL ORDER, PERFORMED IN [facility])  CULTURE, BLOOD (ROUTINE X 2)  CULTURE, BLOOD (ROUTINE X 2)  URINE CULTURE  BASIC METABOLIC PANEL  CBC  LACTIC ACID, PLASMA  LACTIC ACID, PLASMA  I-STAT ARTERIAL BLOOD GAS, ED    EKG EKG Interpretation  Date/Time:  Monday Aug 18 2018 18:07:31 EDT Ventricular Rate:  133 PR Interval:    QRS Duration: 84 QT Interval:  300 QTC Calculation: 447 R Axis:   65 Text Interpretation:  Sinus tachycardia Aberrant complex Probable anteroseptal infarct, old Repolarization abnormality, prob rate related Confirmed by Pricilla LovelessGoldston, Londell Noll [phone]([id number]) on 08/18/2018 6:51:16 PM   Radiology Ct Head Wo Contrast  Result Date: 08/18/2018 CLINICAL DATA:  Dementia EXAM: CT HEAD WITHOUT CONTRAST TECHNIQUE: Contiguous axial images were obtained from the base of the skull through the vertex without intravenous contrast. COMPARISON:  None. FINDINGS: Brain: Chronic atrophic changes are noted as well as chronic white matter ischemic change. No findings to suggest acute hemorrhage, acute infarction or space-occupying mass lesion are noted. Vascular: No hyperdense vessel or unexpected calcification. Skull: Normal. Negative for fracture or focal lesion. Sinuses/Orbits: No acute finding. Other: None. IMPRESSION: Chronic atrophic and ischemic changes without acute abnormality noted. Electronically Signed   By: Alcide CleverMark  Lukens M.D.   On: 08/18/2018 22:37   Dg Chest Port 1 View  Result Date: 08/18/2018 CLINICAL DATA:  Altered mental status EXAM: PORTABLE CHEST 1 VIEW COMPARISON:  None. FINDINGS: Elevation of the right hemidiaphragm with  bandlike scarring or atelectasis of the right midlung. No acute appearing airspace opacity. Cardiomegaly. IMPRESSION: Elevation of the right hemidiaphragm with bandlike scarring or atelectasis of the right midlung. No acute appearing airspace opacity. Cardiomegaly. Electronically Signed    By: Lauralyn Primes M.D.   On: 08/18/2018 20:07    Procedures .Critical Care Performed by: Pricilla Loveless, MD Authorized by: Pricilla Loveless, MD   Critical care provider statement:    Critical care time (minutes):  45   Critical care time was exclusive of:  Separately billable procedures and treating other patients   Critical care was necessary to treat or prevent imminent or life-threatening deterioration of the following conditions:  Sepsis and shock   Critical care was time spent personally by me on the following activities:  Discussions with consultants, evaluation of patient's response to treatment, examination of patient, ordering and performing treatments and interventions, ordering and review of laboratory studies, ordering and review of radiographic studies, pulse oximetry, re-evaluation of patient's condition, obtaining history from patient or surrogate and review of old charts   (including critical care time)  Medications Ordered in ED Medications  vancomycin (VANCOCIN) IVPB 750 mg/150 ml premix (has no administration in time range)  ceFEPIme (MAXIPIME) 2 g in sodium chloride 0.9 % 100 mL IVPB (has no administration in time range)  enoxaparin (LOVENOX) injection 30 mg (has no administration in time range)  acetaminophen (TYLENOL) tablet 650 mg (has no administration in time range)    Or  acetaminophen (TYLENOL) suppository 650 mg (has no administration in time range)  ondansetron (ZOFRAN) tablet 4 mg (has no administration in time range)    Or  ondansetron (ZOFRAN) injection 4 mg (has no administration in time range)  lactated ringers bolus 1,000 mL (1,000 mLs Intravenous New Bag/Given 08/18/18 2243)    And  lactated ringers bolus 250 mL (has no administration in time range)  ceFEPIme (MAXIPIME) 2 g in sodium chloride 0.9 % 100 mL IVPB (0 g Intravenous Stopped 08/18/18 1915)  metroNIDAZOLE (FLAGYL) IVPB 500 mg (0 mg Intravenous Stopped 08/18/18 2032)  vancomycin (VANCOCIN)  IVPB 1000 mg/200 mL premix (0 mg Intravenous Stopped 08/18/18 2149)  acetaminophen (TYLENOL) suppository 650 mg (650 mg Rectal Given 08/18/18 1841)  lactated ringers bolus 500 mL (0 mLs Intravenous Stopped 08/18/18 2043)  lactated ringers bolus 1,000 mL (0 mLs Intravenous Stopped 08/18/18 2242)    And  lactated ringers bolus 250 mL (0 mLs Intravenous Stopped 08/18/18 2149)     Initial Impression / Assessment and Plan / ED Course  I have reviewed the triage vital signs and the nursing notes.  Pertinent labs & imaging results that were available during my care of the patient were reviewed by me and considered in my medical decision making (see chart for details).        Patient treated with Tylenol, fluids, and broad antibiotics.  Unclear exact cause of her infection though she does have 11-20 WBCs in the urine.  I did discuss care with the son, will try IV antibiotics and fluids but it does not seem like he would want further critical care such as pressors, central line, etc.  While in the ED her blood pressure has slowly been trending down, currently on IV fluids.  Discussed with internal medicine teaching service who will admit.  Final Clinical Impressions(s) / ED Diagnoses   Final diagnoses:  Septic shock [facility])    ED Discharge Orders    None       Pricilla Loveless, MD  08/18/18 2244  

## 2018-08-18 NOTE — ED Notes (Signed)
ED TO INPATIENT HANDOFF REPORT  ED Nurse Name and Phone #: \ Maxyne Derocher [phone]  S Name/Age/Gender Nancy Meyer 85 y.o. female Room/Bed: TRAAC/TRAAC  Code Status   Code Status: DNR  Home/SNF/Other Nursing Home Patient oriented to: self Is this baseline? No   Triage Complete: Triage complete  Chief Complaint unresponsive  Triage Note [facility]. Staff report patient has been at her baselinet today and they could not awaken her at 4pm for dinner. Patient arrived with NPA in place and moaning. Bruise noted to face but staff deny recent falls. Skin warm to touch and pale in color, CBG 124    Allergies Allergies  Allergen Reactions  . Macrobid [Nitrofurantoin Macrocrystal] Itching    Level of Care/Admitting Diagnosis ED Disposition    ED Disposition Condition Comment   Admit  Hospital Area: [facility] [[id number]]  Level of Care: Progressive [102]  Covid Evaluation: N/A  Diagnosis: Sepsis [facility]) [[id number]]  Admitting Physician: Nena Polio  Attending Physician: Nena Polio  Estimated length of stay: 3 - 4 days  Certification:: I certify this patient will need inpatient services for at least 2 midnights  PT Class (Do Not Modify): Inpatient [101]  PT Acc Code (Do Not Modify): Private [1]       B Medical/Surgery History History reviewed. No pertinent past medical history. History reviewed. No pertinent surgical history.   A IV Location/Drains/Wounds Patient Lines/Drains/Airways Status   Active Line/Drains/Airways    Name:   Placement date:   Placement time:   Site:   Days:   Peripheral IV 08/18/18 Left Forearm   08/18/18    1834    Forearm   less than 1          Intake/Output Last 24 hours  Intake/Output Summary (Last 24 hours) at 08/18/2018 2213 Last data filed at 08/18/2018 2028 Gross per 24 hour  Intake -  Output 13 ml  Net -13 ml    Labs/Imaging Results for  orders placed or performed during the hospital encounter of 08/18/18 (from the past 48 hour(s))  Lactic acid, plasma     Status: Abnormal   Collection Time: 08/18/18  6:35 PM  Result Value Ref Range   Lactic Acid, Venous 5.3 (HH) 0.5 - 1.9 mmol/L    Comment: CRITICAL RESULT CALLED TO, READ BACK BY AND VERIFIED WITH: J.GLOSTER RN 1919 08/18/2018 MCCORMICK K Performed at [facility], [address] [place], [place] [address]   Comprehensive metabolic panel     Status: Abnormal   Collection Time: 08/18/18  6:35 PM  Result Value Ref Range   Sodium 138 135 - 145 mmol/L   Potassium 4.5 3.5 - 5.1 mmol/L   Chloride 104 98 - 111 mmol/L   CO2 23 22 - 32 mmol/L   Glucose, Bld 123 (H) 70 - 99 mg/dL   BUN 27 (H) 8 - 23 mg/dL   Creatinine, Ser 0.34 (H) 0.44 - 1.00 mg/dL   Calcium 9.0 8.9 - 74.2 mg/dL   Total Protein 5.7 (L) 6.5 - 8.1 g/dL   Albumin 2.9 (L) 3.5 - 5.0 g/dL   AST 69 (H) 15 - 41 U/L   ALT 39 0 - 44 U/L   Alkaline Phosphatase 68 38 - 126 U/L   Total Bilirubin 0.4 0.3 - 1.2 mg/dL   GFR calc non Af Amer 41 (L) >60 mL/min   GFR calc Af Amer 47 (L) >60 mL/min  Anion gap 11 5 - 15    Comment: Performed at [facility], [address] [place], [place] [address]  CBC WITH DIFFERENTIAL     Status: Abnormal   Collection Time: 08/18/18  6:35 PM  Result Value Ref Range   WBC 2.5 (L) 4.0 - 10.5 K/uL   RBC 3.92 3.87 - 5.11 MIL/uL   Hemoglobin 11.8 (L) 12.0 - 15.0 g/dL   HCT 09.839.4 11.936.0 - 14.746.0 %   MCV 100.5 (H) 80.0 - 100.0 fL   MCH 30.1 26.0 - 34.0 pg   MCHC 29.9 (L) 30.0 - 36.0 g/dL   RDW 82.914.5 56.211.5 - 13.015.5 %   Platelets 177 150 - 400 K/uL   nRBC 0.0 0.0 - 0.2 %   Neutrophils Relative % 91 %   Neutro Abs 2.3 1.7 - 7.7 K/uL   Lymphocytes Relative 7 %   Lymphs Abs 0.2 (L) 0.7 - 4.0 K/uL   Monocytes Relative 0 %   Monocytes Absolute 0.0 (L) 0.1 - 1.0 K/uL   Eosinophils Relative 0 %   Eosinophils Absolute 0.0 0.0 - 0.5 K/uL   Basophils Relative 2 %   Basophils Absolute  0.1 0.0 - 0.1 K/uL   nRBC 0 0 /100 WBC   Abs Immature Granulocytes 0.00 0.00 - 0.07 K/uL   Tear Drop Cells PRESENT     Comment: Performed at [facility], [address] [place], [place] [address]  SARS Coronavirus 2 (CEPHEID- Performed in [facility] hospital lab), Hosp Order     Status: None   Collection Time: 08/18/18  6:37 PM  Result Value Ref Range   SARS Coronavirus 2 NEGATIVE NEGATIVE    Comment: (NOTE) If result is NEGATIVE SARS-CoV-2 target nucleic acids are NOT DETECTED. The SARS-CoV-2 RNA is generally detectable in upper and lower  respiratory specimens during the acute phase of infection. The lowest  concentration of SARS-CoV-2 viral copies this assay can detect is 250  copies / mL. A negative result does not preclude SARS-CoV-2 infection  and should not be used as the sole basis for treatment or other  patient management decisions.  A negative result may occur with  improper specimen collection / handling, submission of specimen other  than nasopharyngeal swab, presence of viral mutation(s) within the  areas targeted by this assay, and inadequate number of viral copies  (<250 copies / mL). A negative result must be combined with clinical  observations, patient history, and epidemiological information. If result is POSITIVE SARS-CoV-2 target nucleic acids are DETECTED. The SARS-CoV-2 RNA is generally detectable in upper and lower  respiratory specimens dur ing the acute phase of infection.  Positive  results are indicative of active infection with SARS-CoV-2.  Clinical  correlation with patient history and other diagnostic information is  necessary to determine patient infection status.  Positive results do  not rule out bacterial infection or co-infection with other viruses. If result is PRESUMPTIVE POSTIVE SARS-CoV-2 nucleic acids MAY BE PRESENT.   A presumptive positive result was obtained on the submitted specimen  and confirmed on repeat testing.  While 2019  novel coronavirus  (SARS-CoV-2) nucleic acids may be present in the submitted sample  additional confirmatory testing may be necessary for epidemiological  and / or clinical management purposes  to differentiate between  SARS-CoV-2 and other Sarbecovirus currently known to infect humans.  If clinically indicated additional testing with an alternate test  methodology [phone]([id number]) is advised. The SARS-CoV-2 RNA is generally  detectable  in upper and lower respiratory sp ecimens during the acute  phase of infection. The expected result is Negative. Fact Sheet for Patients:  BoilerBrush.com.cy Fact Sheet for Healthcare Providers: https://pope.com/ This test is not yet approved or cleared by the [place] FDA and has been authorized for detection and/or diagnosis of SARS-CoV-2 by FDA under an Emergency Use Authorization (EUA).  This EUA will remain in effect (meaning this test can be used) for the duration of the COVID-19 declaration under Section 564(b)(1) of the Act, 21 U.S.C. section 360bbb-3(b)(1), unless the authorization is terminated or revoked sooner. Performed at [facility], [address], [place], [place] [address]   I-STAT 7, (LYTES, BLD GAS, ICA, H+H)     Status: Abnormal   Collection Time: 08/18/18  7:03 PM  Result Value Ref Range   pH, Arterial 7.390 7.350 - 7.450   pCO2 arterial 33.8 32.0 - 48.0 mmHg   pO2, Arterial 49.0 (L) 83.0 - 108.0 mmHg   Bicarbonate 20.4 20.0 - 28.0 mmol/L   TCO2 21 (L) 22 - 32 mmol/L   O2 Saturation 85.0 %   Acid-base deficit 4.0 (H) 0.0 - 2.0 mmol/L   Sodium 138 135 - 145 mmol/L   Potassium 4.4 3.5 - 5.1 mmol/L   Calcium, Ion 1.24 1.15 - 1.40 mmol/L   HCT 33.0 (L) 36.0 - 46.0 %   Hemoglobin 11.2 (L) 12.0 - 15.0 g/dL   Patient temperature HIDE    Collection site RADIAL, ALLEN'S TEST ACCEPTABLE    Drawn by RT    Sample type ARTERIAL   Urinalysis, Routine w reflex microscopic      Status: Abnormal   Collection Time: 08/18/18  8:27 PM  Result Value Ref Range   Color, Urine YELLOW YELLOW   APPearance HAZY (A) CLEAR   Specific Gravity, Urine 1.016 1.005 - 1.030   pH 5.0 5.0 - 8.0   Glucose, UA NEGATIVE NEGATIVE mg/dL   Hgb urine dipstick SMALL (A) NEGATIVE   Bilirubin Urine NEGATIVE NEGATIVE   Ketones, ur NEGATIVE NEGATIVE mg/dL   Protein, ur 30 (A) NEGATIVE mg/dL   Nitrite NEGATIVE NEGATIVE   Leukocytes,Ua NEGATIVE NEGATIVE   RBC / HPF 0-5 0 - 5 RBC/hpf   WBC, UA 11-20 0 - 5 WBC/hpf   Bacteria, UA RARE (A) NONE SEEN   Squamous Epithelial / LPF 0-5 0 - 5   Mucus PRESENT     Comment: Performed at [facility], [address] [place], [place] [address]   Dg Chest Port 1 View  Result Date: 08/18/2018 CLINICAL DATA:  Altered mental status EXAM: PORTABLE CHEST 1 VIEW COMPARISON:  None. FINDINGS: Elevation of the right hemidiaphragm with bandlike scarring or atelectasis of the right midlung. No acute appearing airspace opacity. Cardiomegaly. IMPRESSION: Elevation of the right hemidiaphragm with bandlike scarring or atelectasis of the right midlung. No acute appearing airspace opacity. Cardiomegaly. Electronically Signed   By: Lauralyn Primes M.D.   On: 08/18/2018 20:07    Pending Labs Unresulted Labs (From admission, onward)    Start     Ordered   08/19/18 0500  Basic metabolic panel  Tomorrow morning,   R     08/18/18 2213   08/19/18 0500  CBC  Tomorrow morning,   R     08/18/18 2213   08/18/18 1809  Lactic acid, plasma  Now then every 2 hours,   STAT     08/18/18 1809   08/18/18 1809  Blood Culture (routine x  2)  BLOOD CULTURE X 2,   STAT     08/18/18 1809   08/18/18 1809  Urine culture  ONCE - STAT,   STAT     08/18/18 1809          Vitals/Pain Today's Vitals   08/18/18 2030 08/18/18 2124 08/18/18 2145 08/18/18 2148  BP: (!) 157/119 99/65 (!) 123/102   Pulse: (!) 114 (!) 111 (!) 107   Resp: (!) 32 (!) 30 (!) 21   Temp:    98.9 F (37.2 C)   TempSrc:    Oral  SpO2: 98% 98% 96%   Weight:      Height:        Isolation Precautions Droplet and Contact precautions  Medications Medications  vancomycin (VANCOCIN) IVPB 750 mg/150 ml premix (has no administration in time range)  ceFEPIme (MAXIPIME) 2 g in sodium chloride 0.9 % 100 mL IVPB (has no administration in time range)  enoxaparin (LOVENOX) injection 40 mg (has no administration in time range)  acetaminophen (TYLENOL) tablet 650 mg (has no administration in time range)    Or  acetaminophen (TYLENOL) suppository 650 mg (has no administration in time range)  ondansetron (ZOFRAN) tablet 4 mg (has no administration in time range)    Or  ondansetron (ZOFRAN) injection 4 mg (has no administration in time range)  ceFEPIme (MAXIPIME) 2 g in sodium chloride 0.9 % 100 mL IVPB (0 g Intravenous Stopped 08/18/18 1915)  metroNIDAZOLE (FLAGYL) IVPB 500 mg (0 mg Intravenous Stopped 08/18/18 2032)  vancomycin (VANCOCIN) IVPB 1000 mg/200 mL premix (0 mg Intravenous Stopped 08/18/18 2149)  acetaminophen (TYLENOL) suppository 650 mg (650 mg Rectal Given 08/18/18 1841)  lactated ringers bolus 500 mL (0 mLs Intravenous Stopped 08/18/18 2043)  lactated ringers bolus 1,000 mL (1,000 mLs Intravenous New Bag/Given 08/18/18 2043)    And  lactated ringers bolus 250 mL (0 mLs Intravenous Stopped 08/18/18 2149)    Mobility non-ambulatory     Focused Assessments Neuro Assessment Handoff:  Swallow screen pass? No          Neuro Assessment:   Neuro Checks:      Last Documented NIHSS Modified Score:   Has TPA been given? No If patient is a Neuro Trauma and patient is going to OR before floor call report to 4N Charge nurse: [phone] or [phone]     R Recommendations: See Admitting Provider Note  Report given to:   Additional Notes:

## 2018-08-18 NOTE — ED Triage Notes (Signed)
[facility] arrived from [facility] from dementia unit. Staff report patient has been at her baselinet today and they could not awaken her at 4pm for dinner. Patient arrived with NPA in place and moaning. Bruise noted to face but staff deny recent falls. Skin warm to touch and pale in color, CBG 124

## 2018-08-18 NOTE — Progress Notes (Signed)
Pharmacy Antibiotic Note  Nancy Meyer is a 85 y.o. female admitted on 08/18/2018 with sepsis.  Pharmacy has been consulted for vancomycin/cefepime dosing. LA 5.3, Scr 1.14. WBC 2.5  Vancomycin 750 mg IV Q 48 hrs. Goal AUC 400-550. Expected AUC: 443 SCr used: 1.14   Plan: Vancomycin 1000 mg IV x1 then 750 mg IV q48h Cefepime 2g IV q24h Monitor clinical status, cultures, renal function, and length of therapy Monitor vancomycin levels as needed   Height: 5' (152.4 cm) Weight: 120 lb (54.4 kg) IBW/kg (Calculated) : 45.5  Temp (24hrs), Avg:100.8 F (38.2 C), Min:100.8 F (38.2 C), Max:100.8 F (38.2 C)  Recent Labs  Lab 08/18/18 1835  WBC 2.5*  CREATININE 1.14*  LATICACIDVEN 5.3*    Estimated Creatinine Clearance: 20.7 mL/min (A) (by C-G formula based on SCr of 1.14 mg/dL (H)).    Allergies not on file  Antimicrobials this admission: Vancomycin 5/25 >> Cefepime 5/25 >> Flagyl 5/25 x1  Dose adjustments this admission:   Microbiology results: 5/25 UCx: 5/25 BCx: 5/25 COVID:  Thank you for allowing pharmacy to be a part of this patient's care.  Marcelino Freestone, PharmD PGY2 Cardiology Pharmacy Resident Please check AMION for all Pharmacist numbers by unit 08/18/2018 7:36 PM

## 2018-08-18 NOTE — H&P (Signed)
Date: 08/18/2018               Patient Name:  Nancy FleetBarbara Meyer MRN: [id number]  DOB: [birth year] Age / Sex: 85 y.o., female   PCP: System, Pcp Not In         Medical Service: Internal Medicine Teaching Service         Attending Physician: Dr. Inez CatalinaMullen, Emily B, MD    First Contact: Dr. Nedra HaiLee Pager: [phone]  Second Contact: Dr. Evelene CroonSantos Pager: [phone]       After Hours (After 5p/  First Contact Pager: [phone]  weekends / holidays): Second Contact Pager: [phone]   Chief Complaint: Altered mental status  History of Present Illness: 85 yo female with advanced dementia living in a dementia unit at [facility], chronically agitated (on Ativan) and hypertension who presents with altered mental status.  History was given by her son and POA Nancy Meyer  She is normally agitated at baseline.  Per nursing home staff she is able to eat on her own but needs help with ambulation and transfer.  She will ask for her son and is able to speak over the phone with them.  Per her son he does have times where she is very somnolent and will not speak to them.  Today she got 1 dose of ativan around 3 PM and was set at the dinner table around 5 PM.  RN staff noticed that she fell asleep and just believe that she had "tired herself out". They attempted to wake her up and noticed that she was much more unresponsive. Staff became concerned and called EMS.  There was no witnessed aspiration events.  She does not have a history of seizures.  She did not complain of nausea, vomiting, chest pain, shortness of breath, abdominal pain, changes in bowel movements, or urinary symptoms prior to this episode.  The RN staff do note that she has had increasing weakness over the last several months.  The denies any recent falls. Staff have been checking all nursing home residents temperatures and have not noted any fevers or recent illnesses.   In the ED she was found to be febrile to 100.54F, tachypneic up to low  30s, tachycardic from 110s to 130s, with initially normal/high blood pressures with SBP's up to 150s.  She did have 1 low blood pressure at 99/65.  Labs were significant for UA with small hemoglobin, protein, and pyuria (11-20 WBC).  ABG pH 7.39, pCO2 33.8, bicarb 20, pO2 49.  Lactic acidosis of 5.3.  Elevated creatinine of 1.14 (0.85 three months ago).  Leukopenia of 2.5 (WNL).  Normocytic anemia with hemoglobin 11.8 (at baseline). She was started back on cefepime, metronidazole, vancomycin, 1750 mL.    Meds:  Current Meds  Medication Sig  . acetaminophen (TYLENOL) 650 MG CR tablet Take 650 mg by mouth 3 (three) times daily.  Marland Kitchen. buPROPion (WELLBUTRIN XL) 300 MG 24 hr tablet Take 300 mg by mouth daily.  . calcium citrate-vitamin D (CITRACAL+D) 315-200 MG-UNIT tablet Take 1 tablet by mouth daily with breakfast.  . carvedilol (COREG) 3.125 MG tablet Take 3.125 mg by mouth 2 (two) times daily with a meal.  . cholecalciferol (VITAMIN D3) 25 MCG (1000 UT) tablet Take 1,000 Units by mouth daily with breakfast.  . divalproex (DEPAKOTE SPRINKLE) 125 MG capsule Take 125-250 mg by mouth See admin instructions. Take 2 capsules (250 mg) by mouth every morning, take 1 capsule (125 mg) at noon, and  take 2 capsules (250 mg) at 8pm  . docusate sodium (COLACE) 250 MG capsule Take 250 mg by mouth daily.  Marland Kitchen donepezil (ARICEPT) 10 MG tablet Take 10 mg by mouth at bedtime.  . famotidine (PEPCID) 40 MG tablet Take 40 mg by mouth daily.  . ferrous gluconate (FERGON) 240 (27 FE) MG tablet Take 240 mg by mouth daily.  . furosemide (LASIX) 20 MG tablet Take 20 mg by mouth daily.  Marland Kitchen gabapentin (NEURONTIN) 300 MG capsule Take 300-600 mg by mouth See admin instructions. Take 2 capsules (600 mg) by mouth every morning, 1 capsule (300 mg) at 1pm and 2 capsules (600 mg) at bedtime  . Liniments (SALONPAS EX) Place 1 patch onto the skin every 8 (eight) hours as needed (pain).  Marland Kitchen loratadine (CLARITIN) 10 MG tablet Take 10 mg by mouth  daily as needed for allergies.  Marland Kitchen LORazepam (ATIVAN) 0.5 MG tablet Take 0.5 mg by mouth 3 (three) times daily.  Marland Kitchen losartan (COZAAR) 100 MG tablet Take 100 mg by mouth daily with breakfast.  . Melatonin 3 MG TABS Take 3 mg by mouth at bedtime.  . Menthol, Topical Analgesic, (BIOFREEZE) 4 % GEL Apply 1 application topically 3 (three) times daily. Apply to back and hip  . Multiple Vitamins-Minerals (OCUVITE PRESERVISION PO) Take 1 tablet by mouth daily as needed (supplement).  . nystatin cream (MYCOSTATIN) Apply 1 application topically 2 (two) times daily as needed (rash). Apply under left breast  . OVER THE COUNTER MEDICATION Take 1 capsule by mouth daily. Probiotic formula veggie capsule 1B-250 mg  . polyethylene glycol (MIRALAX / GLYCOLAX) 17 g packet Take 17 g by mouth daily.  . potassium chloride SA (K-DUR) 20 MEQ tablet Take 20 mEq by mouth daily.  . Psyllium (METAMUCIL PO) Take 1 packet by mouth daily with breakfast.  . senna (SENOKOT) 8.6 MG TABS tablet Take 1 tablet by mouth 2 (two) times daily as needed (constipation).  . simethicone (MYLICON) 125 MG chewable tablet Chew 125 mg by mouth every 8 (eight) hours as needed for flatulence. GasX  . traMADol (ULTRAM) 50 MG tablet Take 25 mg by mouth every 6 (six) hours as needed for severe pain.     Allergies: Allergies as of 08/18/2018 - Review Complete 08/18/2018  Allergen Reaction Noted  . Macrobid [nitrofurantoin macrocrystal] Itching 08/18/2018   No past medical history on file.  Family History      Family History  Problem Relation Age of Onset  . Cancer Daughter        breast    Social History Social History       Tobacco Use  . Smoking status: Never Smoker  . Smokeless tobacco: Never Used  Substance Use Topics  . Alcohol use: No  . Drug use: No     Review of Systems: A complete ROS was negative except as per HPI.   Physical Exam: Blood pressure 99/65, pulse (!) 111, temperature (!) 100.8 F (38.2 C),  temperature source Rectal, resp. rate (!) 30, height 5' (1.524 m), weight 54.4 kg, SpO2 98 %. General: Somnolent appearing female lying in bed in no acute distress HEENT: Multiple abrasions of the face (right cheek and nose) Neuro: She does awaken to answer questions, she believes that she is at a nursing home.  All other questions are answered with mumbling and nonsensical sentences.  She does not follow commands. Cardiovascular: Tachycardic up to the 110s, normal rhythm Respiratory: Tachypneic with respiratory rate into the low 30s, Velcro  type crackles heard with expiration in all lung fields, on 2 L supplemental oxygen Abdomen: Soft, nontender palpation, no masses appreciated MSK: 1+ pitting edema up to the midcalf of both lower extremities, no warmth or erythema noted. Skin: Multiple abrasions to the face as above and lower extremities bilaterally, warm and dry  EKG: personally reviewed my interpretation is sinus tachycardia, overall poor EKG likely due to high heart rate.  CXR: personally reviewed my interpretation is  Assessment & Plan by Problem: Active Problems:   Sepsis [facility])  Ms. Kuhlmann is a 85 yo female with advanced dementia who presents with altered mental status and found to have signs of sepsis (hypotension, fever, leukopenia, lactic acidosis) of unknown source.  Differential includes pulmonary infection (she is tachypneic and requiring supplemental oxygen) and UTI (pyuria and hematuria on UA).  Head CT performed did not show any acute abnormalities.  Spoke with son who is the healthcare power of attorney who would like patient to receive antibiotics and fluids but no extreme life-saving measures such as vasopressors. He would like her to remain DNR/DNI.  Sepsis:  - Continue vancomycin, metronidazole, and cefepime - Continue IV fluids - Trend lactic acid - Follow-up blood cultures and urine cultures - Continuous cardiac monitoring - COVID pending  Acute kidney injury:  Likely due to sepsis and hypotension - IV fluids as above  Hypertension: Blood pressures low in the setting of active infection. - Hold home meds including carvedilol, furosemide, losartan  Advance dementia: -Hold home donezepil and Ativan  FEN/GI: N.p.o.  DVT prophylaxis: Lovenox CODE STATUS: DNR/DNI  Dispo: Admit patient to Inpatient with expected length of stay greater than 2 midnights.  Signed: Synetta Shadow, MD 08/18/2018, 9:32 PM  Pager: [phone]

## 2018-08-19 DIAGNOSIS — F0391 Unspecified dementia with behavioral disturbance: Secondary | ICD-10-CM

## 2018-08-19 DIAGNOSIS — Z79899 Other long term (current) drug therapy: Secondary | ICD-10-CM

## 2018-08-19 DIAGNOSIS — R0682 Tachypnea, not elsewhere classified: Secondary | ICD-10-CM

## 2018-08-19 DIAGNOSIS — R8281 Pyuria: Secondary | ICD-10-CM

## 2018-08-19 DIAGNOSIS — S0081XA Abrasion of other part of head, initial encounter: Secondary | ICD-10-CM

## 2018-08-19 DIAGNOSIS — E785 Hyperlipidemia, unspecified: Secondary | ICD-10-CM

## 2018-08-19 DIAGNOSIS — X58XXXA Exposure to other specified factors, initial encounter: Secondary | ICD-10-CM

## 2018-08-19 DIAGNOSIS — R319 Hematuria, unspecified: Secondary | ICD-10-CM

## 2018-08-19 DIAGNOSIS — A419 Sepsis, unspecified organism: Secondary | ICD-10-CM

## 2018-08-19 DIAGNOSIS — F419 Anxiety disorder, unspecified: Secondary | ICD-10-CM

## 2018-08-19 DIAGNOSIS — I10 Essential (primary) hypertension: Secondary | ICD-10-CM

## 2018-08-19 DIAGNOSIS — N179 Acute kidney failure, unspecified: Secondary | ICD-10-CM

## 2018-08-19 DIAGNOSIS — G8929 Other chronic pain: Secondary | ICD-10-CM

## 2018-08-19 DIAGNOSIS — Z66 Do not resuscitate: Secondary | ICD-10-CM

## 2018-08-19 DIAGNOSIS — R6521 Severe sepsis with septic shock: Secondary | ICD-10-CM

## 2018-08-19 DIAGNOSIS — Z881 Allergy status to other antibiotic agents status: Secondary | ICD-10-CM

## 2018-08-19 DIAGNOSIS — M4850XS Collapsed vertebra, not elsewhere classified, site unspecified, sequela of fracture: Secondary | ICD-10-CM

## 2018-08-19 DIAGNOSIS — L8990 Pressure ulcer of unspecified site, unspecified stage: Secondary | ICD-10-CM

## 2018-08-19 LAB — BLOOD CULTURE ID PANEL (REFLEXED)

## 2018-08-19 LAB — BASIC METABOLIC PANEL
Anion gap: 10 (ref 5–15)
BUN: 26 mg/dL — ABNORMAL HIGH (ref 8–23)
CO2: 23 mmol/L (ref 22–32)
Calcium: 8.2 mg/dL — ABNORMAL LOW (ref 8.9–10.3)
Chloride: 105 mmol/L (ref 98–111)
Creatinine, Ser: 1.02 mg/dL — ABNORMAL HIGH (ref 0.44–1.00)
GFR calc Af Amer: 54 mL/min — ABNORMAL LOW (ref >60)
GFR calc non Af Amer: 46 mL/min — ABNORMAL LOW (ref >60)
Glucose, Bld: 94 mg/dL (ref 70–99)
Potassium: 4.6 mmol/L (ref 3.5–5.1)
Sodium: 138 mmol/L (ref 135–145)

## 2018-08-19 LAB — CBC
HCT: 31.2 % — ABNORMAL LOW (ref 36.0–46.0)
Hemoglobin: 9.7 g/dL — ABNORMAL LOW (ref 12.0–15.0)
MCH: 31.4 pg (ref 26.0–34.0)
MCHC: 31.1 g/dL (ref 30.0–36.0)
MCV: 101 fL — ABNORMAL HIGH (ref 80.0–100.0)
Platelets: 123 10*3/uL — ABNORMAL LOW (ref 150–400)
RBC: 3.09 MIL/uL — ABNORMAL LOW (ref 3.87–5.11)
RDW: 14.9 % (ref 11.5–15.5)
WBC: 22.1 10*3/uL — ABNORMAL HIGH (ref 4.0–10.5)
nRBC: 0 % (ref 0.0–0.2)

## 2018-08-19 LAB — LACTIC ACID, PLASMA
Lactic Acid, Venous: 2.4 mmol/L (ref 0.5–1.9)
Lactic Acid, Venous: 3.1 mmol/L (ref 0.5–1.9)
Lactic Acid, Venous: 3.4 mmol/L (ref 0.5–1.9)
Lactic Acid, Venous: 4.4 mmol/L (ref 0.5–1.9)
Lactic Acid, Venous: 5.2 mmol/L (ref 0.5–1.9)

## 2018-08-19 LAB — MRSA PCR SCREENING: MRSA by PCR: NEGATIVE

## 2018-08-19 MED ORDER — LACTATED RINGERS IV BOLUS
1000.0000 mL | Freq: Once | INTRAVENOUS | Status: AC
  Administered 2018-08-19: 1000 mL via INTRAVENOUS

## 2018-08-19 MED ORDER — LORAZEPAM 2 MG/ML IJ SOLN
0.5000 mg | Freq: Three times a day (TID) | INTRAMUSCULAR | Status: DC | PRN
  Administered 2018-08-19: 0.5 mg via INTRAVENOUS
  Filled 2018-08-19: qty 1

## 2018-08-19 MED ORDER — HALOPERIDOL 0.5 MG PO TABS
0.5000 mg | ORAL_TABLET | ORAL | Status: DC | PRN
  Filled 2018-08-19: qty 1

## 2018-08-19 MED ORDER — GLYCOPYRROLATE 0.2 MG/ML IJ SOLN
0.2000 mg | INTRAMUSCULAR | Status: DC | PRN
  Administered 2018-08-21 – 2018-08-22 (×2): 0.2 mg via INTRAVENOUS
  Filled 2018-08-19 (×2): qty 1

## 2018-08-19 MED ORDER — SODIUM CHLORIDE 0.9 % IV SOLN
500.0000 mg | Freq: Two times a day (BID) | INTRAVENOUS | Status: DC
  Administered 2018-08-19: 500 mg via INTRAVENOUS
  Filled 2018-08-19 (×2): qty 0.5

## 2018-08-19 MED ORDER — LACTATED RINGERS IV SOLN
INTRAVENOUS | Status: DC
  Administered 2018-08-19: 08:00:00 via INTRAVENOUS

## 2018-08-19 MED ORDER — HALOPERIDOL LACTATE 2 MG/ML PO CONC
0.5000 mg | ORAL | Status: DC | PRN
  Filled 2018-08-19: qty 0.3

## 2018-08-19 MED ORDER — MORPHINE 100MG IN NS 100ML (1MG/ML) PREMIX INFUSION
1.0000 mg/h | INTRAVENOUS | Status: DC
  Administered 2018-08-19: 1 mg/h via INTRAVENOUS
  Filled 2018-08-19: qty 100

## 2018-08-19 MED ORDER — LORAZEPAM 2 MG/ML IJ SOLN: 0.5000 mg | INTRAMUSCULAR | Status: DC | PRN

## 2018-08-19 MED ORDER — HALOPERIDOL LACTATE 5 MG/ML IJ SOLN: 0.5000 mg | INTRAMUSCULAR | Status: DC | PRN

## 2018-08-19 MED ORDER — LORAZEPAM 2 MG/ML IJ SOLN: 1.0000 mg | INTRAMUSCULAR | Status: DC | PRN

## 2018-08-19 MED ORDER — GLYCOPYRROLATE 0.2 MG/ML IJ SOLN: 0.2000 mg | INTRAMUSCULAR | Status: DC | PRN

## 2018-08-19 MED ORDER — PANTOPRAZOLE SODIUM 40 MG IV SOLR
40.0000 mg | Freq: Once | INTRAVENOUS | Status: AC
  Administered 2018-08-19: 40 mg via INTRAVENOUS
  Filled 2018-08-19: qty 40

## 2018-08-19 MED ORDER — GLYCOPYRROLATE 1 MG PO TABS: 1.0000 mg | ORAL_TABLET | ORAL | Status: DC | PRN

## 2018-08-19 NOTE — Progress Notes (Signed)
Civil engineer, contracting [facility]) Hospice  Our referral center received a phone call for hospice services once discharged from pts daughter Pam.  Updated CM Marylene Land, she will reach out to MD and follow back up with [facility].  Thank you, Wallis Bamberg RN, BSN, CCRN [facility] Liaison (in [place]) [phone]

## 2018-08-19 NOTE — Progress Notes (Signed)
Patient not resting well. Hollering out for her mom. Family would like something for the patient's agitation.  This RN paged on call internal medicine resident. Waiting for orders.

## 2018-08-19 NOTE — Progress Notes (Addendum)
Initial Nutrition Assessment  RD working remotely.  DOCUMENTATION CODES:   Not applicable  INTERVENTION:   -RD will follow for diet advancement and supplement as appropriate  NUTRITION DIAGNOSIS:   Inadequate oral intake related to lethargy/confusion as evidenced by NPO status.  GOAL:   Patient will meet greater than or equal to 90% of their needs  MONITOR:   Diet advancement, Labs, Weight trends, Skin, I & O's  REASON FOR ASSESSMENT:   Low Braden    ASSESSMENT:   Nancy Meyer is a 85 yo female with advanced dementia who presents with altered mental status and found to have signs of sepsis (hypotension, fever, leukopenia, lactic acidosis) of unknown source.  Differential includes pulmonary infection (she is tachypneic and requiring supplemental oxygen) and UTI (pyuria and hematuria on UA).  Head CT performed did not show any acute abnormalities.  Spoke with son who is the healthcare power of attorney who would like patient to receive antibiotics and fluids but no extreme life-saving measures such as vasopressors. He would like her to remain DNR/DNI.  Pt admitted with sepsis.   Reviewed I/O's: -13 ml x 24 hours  UOP: 13 ml x 24 hours  Pt unable to provide further nutrition-related history due to advanced dementia and altered mental status.   Per H&P, pt is agitated at baseline. She is able to feed herself. Pt son requesting no extreme life-saving measure but would like to continue antibiotics.   Pt currently NPO due to altered mental status. Pt would benefit from addition of nutritional supplements once diet is advanced due to increased nutrient needs for wound healing.  Labs reviewed.   NUTRITION - FOCUSED PHYSICAL EXAM:    Most Recent Value  Orbital Region  Unable to assess  Upper Arm Region  Unable to assess  Thoracic and Lumbar Region  Unable to assess  Buccal Region  Unable to assess  Temple Region  Unable to assess  Clavicle Bone Region  Unable to assess   Clavicle and Acromion Bone Region  Unable to assess  Scapular Bone Region  Unable to assess  Dorsal Hand  Unable to assess  Patellar Region  Unable to assess  Anterior Thigh Region  Unable to assess  Posterior Calf Region  Unable to assess  Edema (RD Assessment)  Unable to assess  Hair  Unable to assess  Eyes  Unable to assess  Mouth  Unable to assess  Skin  Unable to assess  Nails  Unable to assess       Diet Order:   Diet Order            Diet NPO time specified  Diet effective now              EDUCATION NEEDS:   Not appropriate for education at this time  Skin:  Skin Assessment: Skin Integrity Issues: Skin Integrity Issues:: Stage II Stage II: sacrum  Last BM:  Unknown  Height:   Ht Readings from Last 1 Encounters:  08/18/18 5' (1.524 m)    Weight:   Wt Readings from Last 1 Encounters:  08/18/18 54.4 kg    Ideal Body Weight:  45.5 kg  BMI:  Body mass index is 23.44 kg/m.  Estimated Nutritional Needs:   Kcal:  1450-1650  Protein:  65-80 grams  Fluid:  > 1.4 L    Nancy Meyer A. Mayford Knife, RD, LDN, CDCES Registered Dietitian II Certified Diabetes Care and Education Specialist Pager: [phone] After hours Pager: [phone]

## 2018-08-19 NOTE — Plan of Care (Signed)
°  Problem: Respiratory: °Goal: Ability to maintain adequate ventilation will improve °Outcome: Progressing °  °

## 2018-08-19 NOTE — Progress Notes (Addendum)
   Subjective:  Nancy Meyer is a 85 y.o. F with PMH of dementia, anxiety, htn, hld, chronic pain 2/2 compression fracture admit for sepsis on hospital day 1  Nancy Meyer was seen on rounds this morning minimally responsive but arousable with stimulation. She was observed speaking non-sensible words.  Objective:  Vital signs in last 24 hours: Vitals:   08/18/18 2348 08/19/18 0127 08/19/18 0347 08/19/18 0521  BP: (!) 84/50 (!) 64/36 115/60 (!) 107/50  Pulse: 98 91 (!) 110 (!) 109  Resp: 18 17 (!) 24 (!) 33  Temp: 98.1 F (36.7 C)  99.1 F (37.3 C) 98.9 F (37.2 C)  TempSrc: Oral  Axillary Oral  SpO2: 92% 93% (!) 88% (!) 86%  Weight:      Height:       Constitution: NAD, drowsy, difficult to arouse Cardio: RRR, no murmurs Respiratory: bilateral rales up to mid thorax Abdominal: NTND, Soft, BS+ MSK: Peripheral pulses present, lower extremity 1+ pitting edema up to mid knees Neuro: GCS 8, no eye opening but withdrawal from pain, states incomprehensible words Skin: <1cm stage 2 sacral pressure ulcer w/o purulent drainage, erythema or surrounding warmth  Assessment/Plan:  Active Problems:   Sepsis (HCC)  Nancy Meyer is a 85 y.o. F with PMH of dementia, anxiety, htn, hld, chronic pain 2/2 compression fracture admit for sepsis. She is continuing to be intermittently febrile and has altered mental status. Currently tachycardic, hypotensive and tachypneic. Unclear source of infection but prior admission for similar symptoms due to pneumonia and UTI. UA showed mild bacteruria and chest x-ray without lobar consolidations or pleural effusion. She also had a small sacral ulcer without any obvious sign of infection. Lactate and blood pressure improving with flood resuscitation but she appears to be hypervolemic on exam. Will continue with gentle fluid resuscitation and IV antibiotics.  Fever, Hypotension, Leukocytosis, Tachycardia, Tachypnea, AMS 2/2 septic shock Unclear source  prior hx of pneumonia and UTI. WBC 2.5->22.1 Lactate 5.2->4.4->3.4 Bp this Am 99/59 (MAP 73). Blood culture with gram negative rods on anaerobic bottle, urine culture pending. Started on vanc, cefepime per admitting team - Trend lactate,cbc - C/w gentle fluid resuscitation LR 75cc/hr - F/u blood/urine cultures - D/c cefepime, start meropenem - C/w vancomycin - IV PPI for stress ulcer prophylaxis until tolerating PO - Will need to resume stop fluids and resume diuretics if respiratory distress  Advanced Dementia At baseline, alert but not orientated with intermittent episodes of agitation and unresponsiveness. Able to feed herself but require assistance for transfer and mobility. Currently appear to be worse than baseline. Home meds include donepezil and ativan prn. CT head w/o acute findings - Holding home meds in setting of ams  AKI Baseline creatinine on chart review ~0.7. Admit creatinine 1.14 -> 1.02.  - Fluid resuscitation as above - Trend BMP - Avoid nephrotoxic meds when possible  HTN On furosemide, losartan, carvedilol at home. Current bp 99/59 - Holding home bp meds in setting of hypotension  DVT prophx: Lovenox Diet: NPO while altered Code: DNR/DNI  Dispo: Anticipated discharge in approximately 4-5 day(s).   [name], MD 08/19/2018, 6:36 AM Pager: [phone]

## 2018-08-19 NOTE — Progress Notes (Signed)
Pharmacy Antibiotic Note  Nancy Meyer is a 85 y.o. female admitted on 08/18/2018 with sepsis.  Pharmacy initially consulted for vancomycin/cefepime dosing.   Now asked to change cefepime to meropenem.  LA 5.3 >> 3.4, Scr 1.14 >> 1.02. WBC 22.1  Noted blood cx 1/2 GNR  Plan: Meropenem 500mg  IV q12h Monitor clinical status, cultures, renal function, and length of therapy ?Discontinue Vanc   Height: 5' (152.4 cm) Weight: 120 lb (54.4 kg) IBW/kg (Calculated) : 45.5  Temp (24hrs), Avg:99.3 F (37.4 C), Min:98.1 F (36.7 C), Max:100.8 F (38.2 C)  Recent Labs  Lab 08/18/18 1835 08/18/18 2139 08/19/18 0024 08/19/18 0325 08/19/18 0632 08/19/18 0802  WBC 2.5*  --   --   --  22.1*  --   CREATININE 1.14*  --   --  1.02*  --   --   LATICACIDVEN 5.3* 5.4* 5.2* 4.4*  --  3.4*    Estimated Creatinine Clearance: 23.2 mL/min (A) (by C-G formula based on SCr of 1.02 mg/dL (H)).    Allergies  Allergen Reactions  . Macrobid [Nitrofurantoin Macrocrystal] Itching    Antimicrobials this admission: Vancomycin 5/25 >> Cefepime 5/25 >> 5/26 Merrem 5/26 >> Flagyl 5/25 x1  Dose adjustments this admission:   Microbiology results: 5/25 UCx: pending 5/25 BCx: 1/2 GNR 5/25 COVID: negative  Thank you for allowing pharmacy to be a part of this patient's care.  [employer], Pharm.D., BCPS Clinical Pharmacist Clinical phone for 08/19/2018 from 8:30-4:00 is [phone].  **Pharmacist phone directory can now be found on [email or web address] ([email or web address]).  Listed under [facility].  08/19/2018 11:25 AM

## 2018-08-19 NOTE — Progress Notes (Signed)
RN went to round on pt and took vital signs. Pt hypotensive (60s/30s then 40s/30s) and tachycardic. Pt non-verbal and minimally responive. Noted increased swelling to left eye. Skin clammy to touch. Noted decline over past 2 hours. Findings reported to [facility] MD as well as family to visit pt if possible.

## 2018-08-19 NOTE — Progress Notes (Addendum)
Paged by RN about bp of 60/40. Evaluated pt at bedside. Observed resting in bed without significant distress w/ bp of 65/39. Mentation appear same as this morning. Spoke with family over the phone about coming to see patient for possible imminent demise. Over phone explained comfort care measures for which he requests hold on them until he arrives as he can be at the hospital within the hour.  Patient's daughter Elita Quick) and son [name]) arrived. Discussed current plan of action in pursuing curative treatment and option of moving to comfort care. Explained in detail about discontinuing fluids, antibiotics, monitoring and focusing on pain management and comfort. Mr and Mrs.Pew both agrees with transition to comfort care. Comfort care orders and spiritual care consult placed.

## 2018-08-19 NOTE — Progress Notes (Addendum)
PHARMACY - PHYSICIAN COMMUNICATION CRITICAL VALUE ALERT - BLOOD CULTURE IDENTIFICATION (BCID)  Nancy Meyer is an 85 y.o. female who presented to [facility] on 08/18/2018 with a chief complaint of altered mental status.   Assessment:  85 year old nursing home resident presenting with sepsis and altered mental status. Now with E Coli bacteremia. Urine culture in process. Also noted to have a small sacral ulcer. No history of ESBLs noted.   Name of physician (or Provider) Contacted: Nedra Hai  Current antibiotics: Vanc/Merrem  Changes to prescribed antibiotics recommended:  Stop Vanc. Keep Merrem for now and await susceptibilities per MD  Results for orders placed or performed during the hospital encounter of 08/18/18  Blood Culture ID Panel (Reflexed) (Collected: 08/18/2018  6:35 PM)  Result Value Ref Range   Enterococcus species NOT DETECTED NOT DETECTED   Listeria monocytogenes NOT DETECTED NOT DETECTED   Staphylococcus species NOT DETECTED NOT DETECTED   Staphylococcus aureus (BCID) NOT DETECTED NOT DETECTED   Streptococcus species NOT DETECTED NOT DETECTED   Streptococcus agalactiae NOT DETECTED NOT DETECTED   Streptococcus pneumoniae NOT DETECTED NOT DETECTED   Streptococcus pyogenes NOT DETECTED NOT DETECTED   Acinetobacter baumannii NOT DETECTED NOT DETECTED   Enterobacteriaceae species DETECTED (A) NOT DETECTED   Enterobacter cloacae complex NOT DETECTED NOT DETECTED   Escherichia coli DETECTED (A) NOT DETECTED   Klebsiella oxytoca NOT DETECTED NOT DETECTED   Klebsiella pneumoniae NOT DETECTED NOT DETECTED   Proteus species NOT DETECTED NOT DETECTED   Serratia marcescens NOT DETECTED NOT DETECTED   Carbapenem resistance NOT DETECTED NOT DETECTED   Haemophilus influenzae NOT DETECTED NOT DETECTED   Neisseria meningitidis NOT DETECTED NOT DETECTED   Pseudomonas aeruginosa NOT DETECTED NOT DETECTED   Candida albicans NOT DETECTED NOT DETECTED   Candida glabrata NOT DETECTED  NOT DETECTED   Candida krusei NOT DETECTED NOT DETECTED   Candida parapsilosis NOT DETECTED NOT DETECTED   Candida tropicalis NOT DETECTED NOT DETECTED    Sharin Mons, PharmD, BCPS, BCIDP Infectious Diseases Clinical Pharmacist Phone: [phone] 08/19/2018  11:45 AM

## 2018-08-19 NOTE — Progress Notes (Signed)
   08/19/18 1800  Clinical Encounter Type  Visited With Patient and family together;Health care provider;Patient not available  Visit Type Initial;Patient actively dying;Spiritual support  Referral From Physician  Spiritual Encounters  Spiritual Needs Prayer;Emotional;Grief support  Stress Factors  Patient Stress Factors Exhausted  Family Stress Factors Major life changes;Loss of control   Responded to EOL consult.  Son, granddaughter, daughter, and SIL present.  Encouraged storytelling about pt.  Empathetic listening about pt's dementia, pain as recounted by family.  Pt long-time member of [employer] in [place], where she was active in [employer].  Also enjoyed reading mystery books, taking walks, and cooking for Saturday family suppers ("traditional southern meals").    Prayed [facility] prayers at end of life w/ pt and family.    Pls page chaplain if desire return.  This chaplain here until 8:30am, then day shift will return.  Margretta Sidle resident, [phone]

## 2018-08-20 DIAGNOSIS — F0390 Unspecified dementia without behavioral disturbance: Secondary | ICD-10-CM

## 2018-08-20 DIAGNOSIS — A419 Sepsis, unspecified organism: Secondary | ICD-10-CM

## 2018-08-20 DIAGNOSIS — A4151 Sepsis due to Escherichia coli [E. coli]: Principal | ICD-10-CM

## 2018-08-20 LAB — URINE CULTURE

## 2018-08-20 NOTE — Progress Notes (Signed)
Nutrition Brief Note RD working remotely. Chart reviewed. Pt now transitioning to comfort care.  No further nutrition interventions warranted at this time.  Please re-consult as needed.   Dawud Mays A. Lindsay Soulliere, RD, LDN, CDCES Registered Dietitian II Certified Diabetes Care and Education Specialist Pager: [phone] After hours Pager: [phone]  

## 2018-08-20 NOTE — Progress Notes (Signed)
Patient's family at bedside and concerned about how often vitals will be taken. I explained the orders to the family and that they will be taken again in the morning between 0500 and 0600.   Family requested that we check the patient's vital signs before they go home for the night. Patient's BP 90/52 but family doesn't want to give her another bolus.   This RN notified the on call internal medicine resident to notify them of the patient's BP and family's wishes.   Will continue to monitor.

## 2018-08-20 NOTE — Progress Notes (Signed)
   Subjective:  Nancy Meyer is a 85 y.o. F with PMH of advanced dementia, anxiety, htn, hld, chronic pain 2/2 compression fracture admit for sepsis on hospital day 2  Nancy Meyer was examined and evaluated at bedside this am with family present. Observed resting comfortably and minimally responsive. Discussed with family about plan to continue comfort care measures. Family expressed understanding.  Objective:  Vital signs in last 24 hours: Vitals:   08/19/18 1709 08/19/18 2236 08/20/18 0001 08/20/18 0508  BP: (!) 85/64 (!) 142/95 (!) 90/52 (!) 75/47  Pulse: (!) 110 (!) 128 (!) 109 (!) 111  Resp: 19 16  15   Temp:  98.9 F (37.2 C)  98.9 F (37.2 C)  TempSrc:  Oral  Oral  SpO2: 90% 93% 93% 94%  Weight:      Height:        Gen:  Thin-appearing, NAD, sleeping HEENT: EOMI, PERRL, No nasal discharge, MMM, nasal cannula on and functioning CV: Tachycardic, regular rhythm, S1, S2 normal Pulm: basilar rales Extm: ROM intact, Peripheral pulses intact, 2+ pitting edema around ankles Skin: Dry, Warm  Assessment/Plan:  Active Problems:   Sepsis (HCC)   Pressure injury of skin  Nancy Meyer is a 85 y.o. F with PMH of dementia, anxiety, htn, hld, chronic pain 2/2 compression fracture admit for sepsis. Overnight she had episode of profound hypotension concerning for imminent death, improved with IV fluid resuscitation. Family were able to visit and was agreeable to transitioning to comfort care. Not agitated or appears to be in distress this am. Continuing comfort care with expectation for imminent in-hospital death.  Multi-organ failure 2/2 sepsis from urinary source Blood cluture growing E.coli and enterobacter. Currently on comfort care measure with discontinuation of antibiotics. BP 75/47 this am. - Continue comfort care measures - C/w morphine drip 1g/hr - C/w ativan, haldol, glycoperridol PRN for anxiety, delirium, secretions  DVT prophx: Discontinued for comfort-care /  end of life Diet: NPO Code: DNR  Dispo: Anticipated discharge in approximately 0-1 day(s).   Nancy Barrio, MD 08/20/2018, 7:49 AM Pager: [phone]

## 2018-08-20 NOTE — Progress Notes (Signed)
Patient's BP 75/47. This RN notified the IM Resident and spoke with the patient's brother [name] as well. Brother said he will be up here to visit around 0700 and to keep the patient comfortable.  No new orders.  Will continue to monitor.

## 2018-08-20 NOTE — Progress Notes (Signed)
  Date: 08/20/2018  Patient name: Nancy Meyer  Medical record number: [id number]  Date of birth: 31-Jul-1920   I have seen and evaluated this patient and I have discussed the plan of care with the house staff. Please see Dr. Marigene Ehlers note for complete details. I concur with his findings and plan.   Comfort care ongoing.    Inez Catalina, MD 08/20/2018, 1:42 PM

## 2018-08-21 LAB — CULTURE, BLOOD (ROUTINE X 2): Special Requests: ADEQUATE

## 2018-08-21 NOTE — Progress Notes (Signed)
   Subjective:  Nancy Meyer is a 85 y.o. F with PMH of PMH of advanced dementia, anxiety, htn, hld, chronic pain 2/2 compression fracture admit for sepsis on hospital day 3  Nancy Meyer was examined and evaluated at bedside this AM. She was observed resting comfortably in bed. Respiratory rate observed to be around 9.  Objective:  Vital signs in last 24 hours: Vitals:   08/20/18 0001 08/20/18 0508 08/20/18 1259 08/20/18 2216  BP: (!) 90/52 (!) 75/47 (!) 80/47 (!) 95/52  Pulse: (!) 109 (!) 111 87 100  Resp:  15 (!) 9 (!) 8  Temp:  98.9 F (37.2 C) 98.1 F (36.7 C) (!) 97.5 F (36.4 C)  TempSrc:  Oral Axillary Oral  SpO2: 93% 94% (!) 88% 94%  Weight:      Height:       Physical Exam  Constitutional:  Thin-appearing  HENT:  Head: Normocephalic and atraumatic.  Mouth/Throat: Oropharynx is clear and moist.  Eyes:  Fluid-filled bullae around eyes  Cardiovascular: Normal rate, regular rhythm, normal heart sounds and intact distal pulses.  Pulmonary/Chest: Effort normal and breath sounds normal.  Bradypneic  Musculoskeletal: Normal range of motion.        General: No edema.  Skin: Skin is warm and dry.   Assessment/Plan:  Active Problems:   Sepsis (HCC)   Pressure injury of skin   Septic shock (HCC)  Nancy Meyer a 85 y.o.Fwith PMH ofdementia, anxiety, htn, hld, chronic pain 2/2 compression fracture admit for sepsis. Her vital sign checks were switch to per family request and she is noted to be progressively bradypnea. Continues to be hypotensive at 95/52. Will consult social work for possible discharge to hospice facility.  Multi-organ failure 2/2 sepsis from urinary source Was put on comfort care on 08/19/18 with morphine drip and discontinuation of antibiotics. She appears to be comfortable on exam. - Continue comfort care measures - C/w morphine drip 1g/hr - C/w ativan, haldol, glycoperridol PRN for anxiety, delirium, secretions  DVT prophx:  Discontinued for comfort-care / end of life Diet: NPO Code: DNR  Dispo: Anticipated discharge in approximately 0-1 day(s).   [name], MD 08/21/2018, 6:52 AM Pager: [phone]

## 2018-08-21 NOTE — Progress Notes (Signed)
  Date: 08/21/2018  Patient name: Nancy Meyer  Medical record number: [id number]  Date of birth: 1920-06-03   I have seen and evaluated this patient and I have discussed the plan of care with the house staff. Please see Dr. Marigene Ehlers note for complete details. I concur with his findings and plan.   Inez Catalina, MD 08/21/2018, 11:58 AM

## 2018-08-22 MED ORDER — GLYCOPYRROLATE 1 MG PO TABS: 1.0000 mg | ORAL_TABLET | ORAL | Status: AC | PRN

## 2018-08-22 MED ORDER — MORPHINE 100MG IN NS 100ML (1MG/ML) PREMIX INFUSION: 1.0000 mg/h | INTRAVENOUS | Status: AC

## 2018-08-22 MED ORDER — ACETAMINOPHEN 325 MG PO TABS: 650.0000 mg | ORAL_TABLET | Freq: Four times a day (QID) | ORAL | Status: AC | PRN

## 2018-08-22 MED ORDER — HALOPERIDOL LACTATE 2 MG/ML PO CONC: 0.5000 mg | ORAL | 0 refills | Status: AC | PRN

## 2018-08-22 MED ORDER — HALOPERIDOL 0.5 MG PO TABS: 0.5000 mg | ORAL_TABLET | ORAL | Status: AC | PRN

## 2018-08-22 MED ORDER — GLYCOPYRROLATE 0.2 MG/ML IJ SOLN: 0.2000 mg | INTRAMUSCULAR | Status: AC | PRN

## 2018-08-22 MED ORDER — LORAZEPAM 2 MG/ML IJ SOLN: 1.0000 mg | INTRAMUSCULAR | 0 refills | Status: AC | PRN

## 2018-08-22 MED ORDER — HALOPERIDOL LACTATE 5 MG/ML IJ SOLN: 0.5000 mg | INTRAMUSCULAR | Status: AC | PRN

## 2018-08-22 NOTE — TOC Transition Note (Signed)
Transition of Care [facility]) - CM/SW Discharge Note   Patient Details  Name: Nancy Meyer MRN: [id number] Date of Birth: 1920/08/15  [facility] CM/SW Contact:  Mearl Latin, LCSW Phone Number: 08/22/2018, 2:22 PM   Clinical Narrative:    Patient will DC to: [facility] Anticipated DC date: 08/22/2018 Family notified: Son, [name] Transport by: Sharin Mons   Per MD patient ready for DC to [facility]. RN, patient, patient's family, and facility notified of DC. Discharge Summary and FL2 sent to facility. RN to call report prior to discharge [phone]). DC packet on chart. Ambulance transport requested for patient.   CSW will sign off for now as social work intervention is no longer needed. Please consult [place] again if new needs arise.  Cristobal Goldmann, LCSW Clinical Social Worker [phone]    Final next level of care: Hospice Medical Facility Barriers to Discharge: No Barriers Identified   Patient Goals and CMS Choice Patient states their goals for this hospitalization and ongoing recovery are:: Comfort   Choice offered to / list presented to : Adult Children, HC POA / Jarold Song)  Discharge Placement                Patient to be transferred to facility by: [facility] Name of family member notified: Son, [name] Patient and family notified of of transfer: 08/22/18  Discharge Plan and Services In-house Referral: Clinical Social Work Discharge Planning Services: NA Post Acute Care Choice: Hospice          DME Arranged: N/A DME Agency: NA       HH Arranged: NA HH Agency: NA        Social Determinants of Health (SDOH) Interventions     Readmission Risk Interventions No flowsheet data found.

## 2018-08-22 NOTE — Progress Notes (Signed)
  Date: 08/22/2018  Patient name: Nancy Meyer  Medical record number: [id number]  Date of birth: [birth year]   I have seen and evaluated this patient and I have discussed the plan of care with the house staff. Please see Dr. Marigene Ehlers note for complete details. I concur with his findings and plan.  Hopefully will get a bed at inpatient hospice today for discharge.    Inez Catalina, MD 08/22/2018, 1:38 PM

## 2018-08-22 NOTE — Care Management Important Message (Signed)
Important Message  Patient Details  Name: Nancy Meyer MRN: [id number] Date of Birth: 08-14-1920   Medicare Important Message Given:  Yes    Dorena Bodo 08/22/2018, 3:30 PM

## 2018-08-22 NOTE — Discharge Summary (Signed)
Name: Nancy Meyer MRN: [id number] DOB: 01/18/1921 85 y.o. PCP: System, Pcp Not In  Date of Admission: 08/18/2018  5:56 PM Date of Discharge: 08/22/2018 1:30 PM Attending Physician: Inez Catalina, MD  Discharge Diagnosis: 1. Sepsis 2/2 E.coli bacteremia  Discharge Medications: Allergies as of 08/22/2018      Reactions   Macrobid [nitrofurantoin Macrocrystal] Itching      Medication List    STOP taking these medications   acetaminophen 650 MG CR tablet Commonly known as:  TYLENOL Replaced by:  acetaminophen 325 MG tablet   Biofreeze 4 % Gel Generic drug:  Menthol (Topical Analgesic)   buPROPion 300 MG 24 hr tablet Commonly known as:  WELLBUTRIN XL   calcium citrate-vitamin D 315-200 MG-UNIT tablet Commonly known as:  CITRACAL+D   carvedilol 3.125 MG tablet Commonly known as:  COREG   cholecalciferol 25 MCG (1000 UT) tablet Commonly known as:  VITAMIN D3   divalproex 125 MG capsule Commonly known as:  DEPAKOTE SPRINKLE   docusate sodium 250 MG capsule Commonly known as:  COLACE   donepezil 10 MG tablet Commonly known as:  ARICEPT   famotidine 40 MG tablet Commonly known as:  PEPCID   ferrous gluconate 240 (27 FE) MG tablet Commonly known as:  FERGON   furosemide 20 MG tablet Commonly known as:  LASIX   gabapentin 300 MG capsule Commonly known as:  NEURONTIN   loratadine 10 MG tablet Commonly known as:  CLARITIN   LORazepam 0.5 MG tablet Commonly known as:  ATIVAN Replaced by:  LORazepam 2 MG/ML injection   losartan 100 MG tablet Commonly known as:  COZAAR   Melatonin 3 MG Tabs   METAMUCIL PO   nystatin cream Commonly known as:  MYCOSTATIN   OCUVITE PRESERVISION PO   OVER THE COUNTER MEDICATION   polyethylene glycol 17 g packet Commonly known as:  MIRALAX / GLYCOLAX   potassium chloride SA 20 MEQ tablet Commonly known as:  K-DUR   SALONPAS EX   senna 8.6 MG Tabs tablet Commonly known as:  SENOKOT   simethicone 125 MG  chewable tablet Commonly known as:  MYLICON   traMADol 50 MG tablet Commonly known as:  ULTRAM     TAKE these medications   acetaminophen 325 MG tablet Commonly known as:  TYLENOL Take 2 tablets (650 mg total) by mouth every 6 (six) hours as needed for mild pain (or Fever >/= 101). Replaces:  acetaminophen 650 MG CR tablet   glycopyrrolate 1 MG tablet Commonly known as:  ROBINUL Take 1 tablet (1 mg total) by mouth every 4 (four) hours as needed (excessive secretions).   glycopyrrolate 0.2 MG/ML injection Commonly known as:  ROBINUL Inject 1 mL (0.2 mg total) into the skin every 4 (four) hours as needed (excessive secretions).   glycopyrrolate 0.2 MG/ML injection Commonly known as:  ROBINUL Inject 1 mL (0.2 mg total) into the vein every 4 (four) hours as needed (excessive secretions).   haloperidol 0.5 MG tablet Commonly known as:  HALDOL Take 1 tablet (0.5 mg total) by mouth every 4 (four) hours as needed for agitation (or delirium).   haloperidol 2 MG/ML solution Commonly known as:  HALDOL Place 0.3 mLs (0.6 mg total) under the tongue every 4 (four) hours as needed for agitation (or delirium).   haloperidol lactate 5 MG/ML injection Commonly known as:  HALDOL Inject 0.1 mLs (0.5 mg total) into the vein every 4 (four) hours as needed (or delirium).   LORazepam 2 MG/ML  injection Commonly known as:  ATIVAN Inject 0.5 mLs (1 mg total) into the vein every 4 (four) hours as needed for anxiety. Replaces:  LORazepam 0.5 MG tablet   morphine 1 mg/mL Soln infusion Inject 1 mg/hr into the vein continuous.       Disposition and follow-up:   [name] was discharged from [facility] in Serious condition.  At the hospital follow up visit please address:  1. Sepsis 2/2 E.coli bacteremia - Currently on comfort care. Up-titrate morphine dose for comfort as needed  2.  Labs / imaging needed at time of follow-up: N/A  3.  Pending labs/ test needing  follow-up: N/A  Follow-up Appointments:   Hospital Course by problem list: 1. Sepsis 2/2 E.coli bacteremia: Nancy Meyer is 85 yo F w/ PMH of advanced dementia who presents with AMS from group home. She was found to be delirious, hypotensive, febrile with lactic acidosis and leukopenia. No airspace opacity was detected on Chest X-ray. Head CT showed chronic atrophic and ischemic changes but no acute findings. Her urine showed elevated white count and rare bacteria. She was started on vancomycin, flagyl and cefepime and given aggressive fluid resuscitation. Next day she became unresponsive with intermittent hypotensive episodes with systolic bp down to 60s treated with further fluid resuscitations. Blood cultures grew E.coli. She began to show signs of hypervolemia with pitting edema and rales on lung exam. Goals of care discussion was had with family and she was made full comfort care. Antibiotics and fluid resuscitations were stopped and she was started on morphine with ativan, haldol PRN. She was discharged to residential hospice facility per family request.  Discharge Vitals:   BP (!) 82/40 (BP Location: Right Arm)   Pulse 77   Temp 97.8 F (36.6 C) (Oral)   Resp (!) 8   Ht 5' (1.524 m)   Wt 54.4 kg   SpO2 93%   BMI 23.44 kg/m   Pertinent Labs, Studies, and Procedures:  CBC Latest Ref Rng & Units 08/19/2018 08/18/2018 08/18/2018  WBC 4.0 - 10.5 K/uL 22.1(H) - 2.5(L)  Hemoglobin 12.0 - 15.0 g/dL 1.6(X) 11.2(L) 11.8(L)  Hematocrit 36.0 - 46.0 % 31.2(L) 33.0(L) 39.4  Platelets 150 - 400 K/uL 123(L) - 177   BMP Latest Ref Rng & Units 08/19/2018 08/18/2018 08/18/2018  Glucose 70 - 99 mg/dL 94 - 096(E)  BUN 8 - 23 mg/dL 45(W) - 09(W)  Creatinine 0.44 - 1.00 mg/dL 1.19(J) - 4.78(G)  Sodium 135 - 145 mmol/L 138 138 138  Potassium 3.5 - 5.1 mmol/L 4.6 4.4 4.5  Chloride 98 - 111 mmol/L 105 - 104  CO2 22 - 32 mmol/L 23 - 23  Calcium 8.9 - 10.3 mg/dL 8.2(L) - 9.0   Specimen Description BLOOD  RIGHT WRIST   Special Requests BOTTLES DRAWN AEROBIC ONLY Blood Culture results may not be optimal due to an inadequate volume of blood received in culture bottles  Culture Setup Time GRAM NEGATIVE RODS  AEROBIC BOTTLE ONLY  CRITICAL VALUE NOTED. VALUE IS CONSISTENT WITH PREVIOUSLY REPORTED AND CALLED VALUE.  Performed at [facility], [address], [place], [place] [address]   Culture ESCHERICHIA COLIAbnormal    Report Status 08/21/2018 FINAL   Organism ID, Bacteria ESCHERICHIA COLI   Resulting Agency [facility]  Susceptibility    Escherichia coli    MIC    AMPICILLIN >=32 RESIST... Resistant    AMPICILLIN/SULBACTAM 16 INTERMED... Intermediate    CEFAZOLIN <=4 SENSITIVE  Sensitive  CEFEPIME <=1 SENSITIVE  Sensitive    CEFTAZIDIME <=1 SENSITIVE  Sensitive    CEFTRIAXONE <=1 SENSITIVE  Sensitive    CIPROFLOXACIN >=4 RESISTANT  Resistant    Extended ESBL NEGATIVE  Sensitive    GENTAMICIN <=1 SENSITIVE  Sensitive    IMIPENEM <=0.25 SENS... Sensitive    PIP/TAZO <=4 SENSITIVE  Sensitive    TRIMETH/SULFA <=20 SENSIT... Sensitive      Discharge Instructions: Discharge Instructions    Call MD for:  severe uncontrolled pain   Complete by:  As directed      Nancy Meyer was admitted for urosepsis.   During the course of this hospitalization, she was made full comfort-care. Here are our recommendations at discharge:  - Please ensure she is pain-free and her agitations is well controlled with Ativan and morphine.  Signed: Theotis BarrioLee, Xiadani Damman K, MD 08/22/2018, 1:44 PM   Pager: [phone]

## 2018-08-22 NOTE — Progress Notes (Signed)
   Subjective:  Nancy Meyer is a 85 y.o. F with PMH of PMH of advanced dementia, anxiety, htn, hld, chronic pain 2/2 compression fracture admit for sepsis on hospital day 4  Examined at bedside. Observed resting comfortably. Spoke with POA over the phone who requests placement to hospice facility for easier visitation.  Objective:  Vital signs in last 24 hours: Vitals:   08/20/18 2216 08/21/18 2255 08/21/18 2307 08/22/18 0607  BP: (!) 95/52 (!) 82/40    Pulse: 100 92 94 77  Resp: (!) 8     Temp: (!) 97.5 F (36.4 C) 97.8 F (36.6 C)    TempSrc: Oral Oral    SpO2: 94% (!) 68% 99% 93%  Weight:      Height:       Assessment/Plan:  Active Problems:   Sepsis (HCC)   Pressure injury of skin   Septic shock (HCC)  Nancy Meyer a 85 y.o.Fwith PMH ofdementia, anxiety, htn, hld, chronic pain 2/2 compression fracture admit for sepsis. Currently on comfort care. Continues to be dyspneic and hypotensive but appears comfortable on morphine drip. Stable for transport to hospice facility.  Multi-organ failure 2/2 sepsis from urinary source Was put on comfort care on 08/19/18 with morphine drip and discontinuation of antibiotics. Appears comfortable on exam - Social work consult for placement to hospice facility - Continue comfort care measures - C/w morphine drip 1g/hr - C/w ativan, haldol, glycoperridol PRN for anxiety, delirium, secretions  DVT prophx: Discontinued for comfort-care / end of life Diet: NPO Code: DNR  Dispo: Anticipated discharge in approximately 0-1 day(s).   Nancy Barrio, MD 08/22/2018, 7:02 AM Pager: [phone]

## 2018-08-22 NOTE — Progress Notes (Signed)
Pt was on morphine 100mg  in NS (1mg /mL) infusion. Pt DC to hospice.  Remainder of Morphine drip wasted with Francis Dowse RN. Remainder 79mL and wasted into stericycle.

## 2018-08-22 NOTE — Discharge Instructions (Signed)
Nancy Meyer was admitted for urosepsis.   During the course of this hospitalization, she was made full comfort-care. Here are our recommendations at discharge:  - Please ensure she is pain-free and her agitations is well controlled with Ativan and morphine.

## 2018-08-22 NOTE — Progress Notes (Signed)
Civil engineer, contracting [facility]) Hospital Liaison note.   Received request from Cristobal Goldmann, CSW for family interest in [facility] with request for transfer today. Chart reviewed and eligibility confirmed. Spoke with family to confirm interest and explain services. Family agreeable to transfer today today. CSW aware.  Registration paper work completed. Dr. Kern Reap  to assume care per family request.   Please fax discharge summary to [phone]. RN please call report to [phone]. Please arrange transport for patient.    Thank you,      Elsie Saas, RN, [facility]   [facility] Hospital Liaison   [phone]     [facility] Liaisons are on AMION

## 2018-08-22 NOTE — TOC Initial Note (Signed)
Transition of Care [facility]) - Initial/Assessment Note    Patient Details  Name: Nancy Meyer MRN: [id number] Date of Birth: [birth year]  Transition of Care [facility]) CM/SW Contact:    Nancy Latin, LCSW Phone Number: 08/22/2018, 10:55 AM  Clinical Narrative:                 CSW received consult regarding hospice placement. CSW spoke with patient's son/POA and daughter. They confirmed preference for [facility]. CSW sent referral for review.   Expected Discharge Plan: Hospice Medical Facility Barriers to Discharge: Continued Medical Work up   Patient Goals and CMS Choice Patient states their goals for this hospitalization and ongoing recovery are:: Nancy   Choice offered to / list presented to : NA  Expected Discharge Plan and Services Expected Discharge Plan: Hospice Medical Facility In-house Referral: Clinical Social Work Discharge Planning Services: NA Post Acute Care Choice: Hospice Living arrangements for the past 2 months: Single Family Home                 DME Arranged: N/A DME Agency: NA       HH Arranged: NA HH Agency: NA        Prior Living Arrangements/Services Living arrangements for the past 2 months: Single Family Home Lives with:: Adult Children Patient language and need for interpreter reviewed:: Yes        Need for Family Participation in Patient Care: Yes (Comment) Care giver support system in place?: Yes (comment)   Criminal Activity/Legal Involvement Pertinent to Current Situation/Hospitalization: No - Comment as needed  Activities of Daily Living      Permission Sought/Granted Permission sought to share information with : Facility Medical sales representative, Family Supports Permission granted to share information with : Yes, Verbal Permission Granted  Share Information with NAME: Nancy Meyer  Permission granted to share info w AGENCY: [employer]  Permission granted to share info w Relationship: Son/POA  Permission granted to share info w  Contact Information: [phone]  Emotional Assessment Appearance:: Appears stated age Attitude/Demeanor/Rapport: Unable to Assess Affect (typically observed): Unable to Assess Orientation: : (Disoriented x4) Alcohol / Substance Use: Not Applicable Psych Involvement: No (comment)  Admission diagnosis:  Septic shock (HCC) [A41.9, R65.21] Patient Active Problem List   Diagnosis Date Noted  . Septic shock (HCC)   . Pressure injury of skin 08/19/2018  . Sepsis (HCC) 08/18/2018   PCP:  System, Pcp Not In Pharmacy:  No Pharmacies Listed    Social Determinants of Health (SDOH) Interventions    Readmission Risk Interventions No flowsheet data found.

## 2018-08-22 NOTE — Progress Notes (Signed)
Pt prepared for d/c to Hospice, [facility]. Report called and given to RN at facility. IV flushed, left clean, dry & intact for facility use/comfort care. Skin intact except as charted in most recent assessments. Vitals are stable.  Pt to be transported by ambulance service. Family aware of Pt's departure to facility.

## 2018-08-25 ENCOUNTER — Encounter (HOSPITAL_COMMUNITY): Payer: Self-pay | Admitting: Emergency Medicine

## 2018-08-25 DEATH — deceased

## 2020-07-12 IMAGING — CR DG CHEST 2V
2 series · 2 of 2 positions shown · non-contrast
Comparison: Portable chest 03/31/2017 and earlier.

CLINICAL DATA: [AGE] female with lethargy and nonproductive
cough.

EXAM:
CHEST - 2 VIEW

[w chest lat]
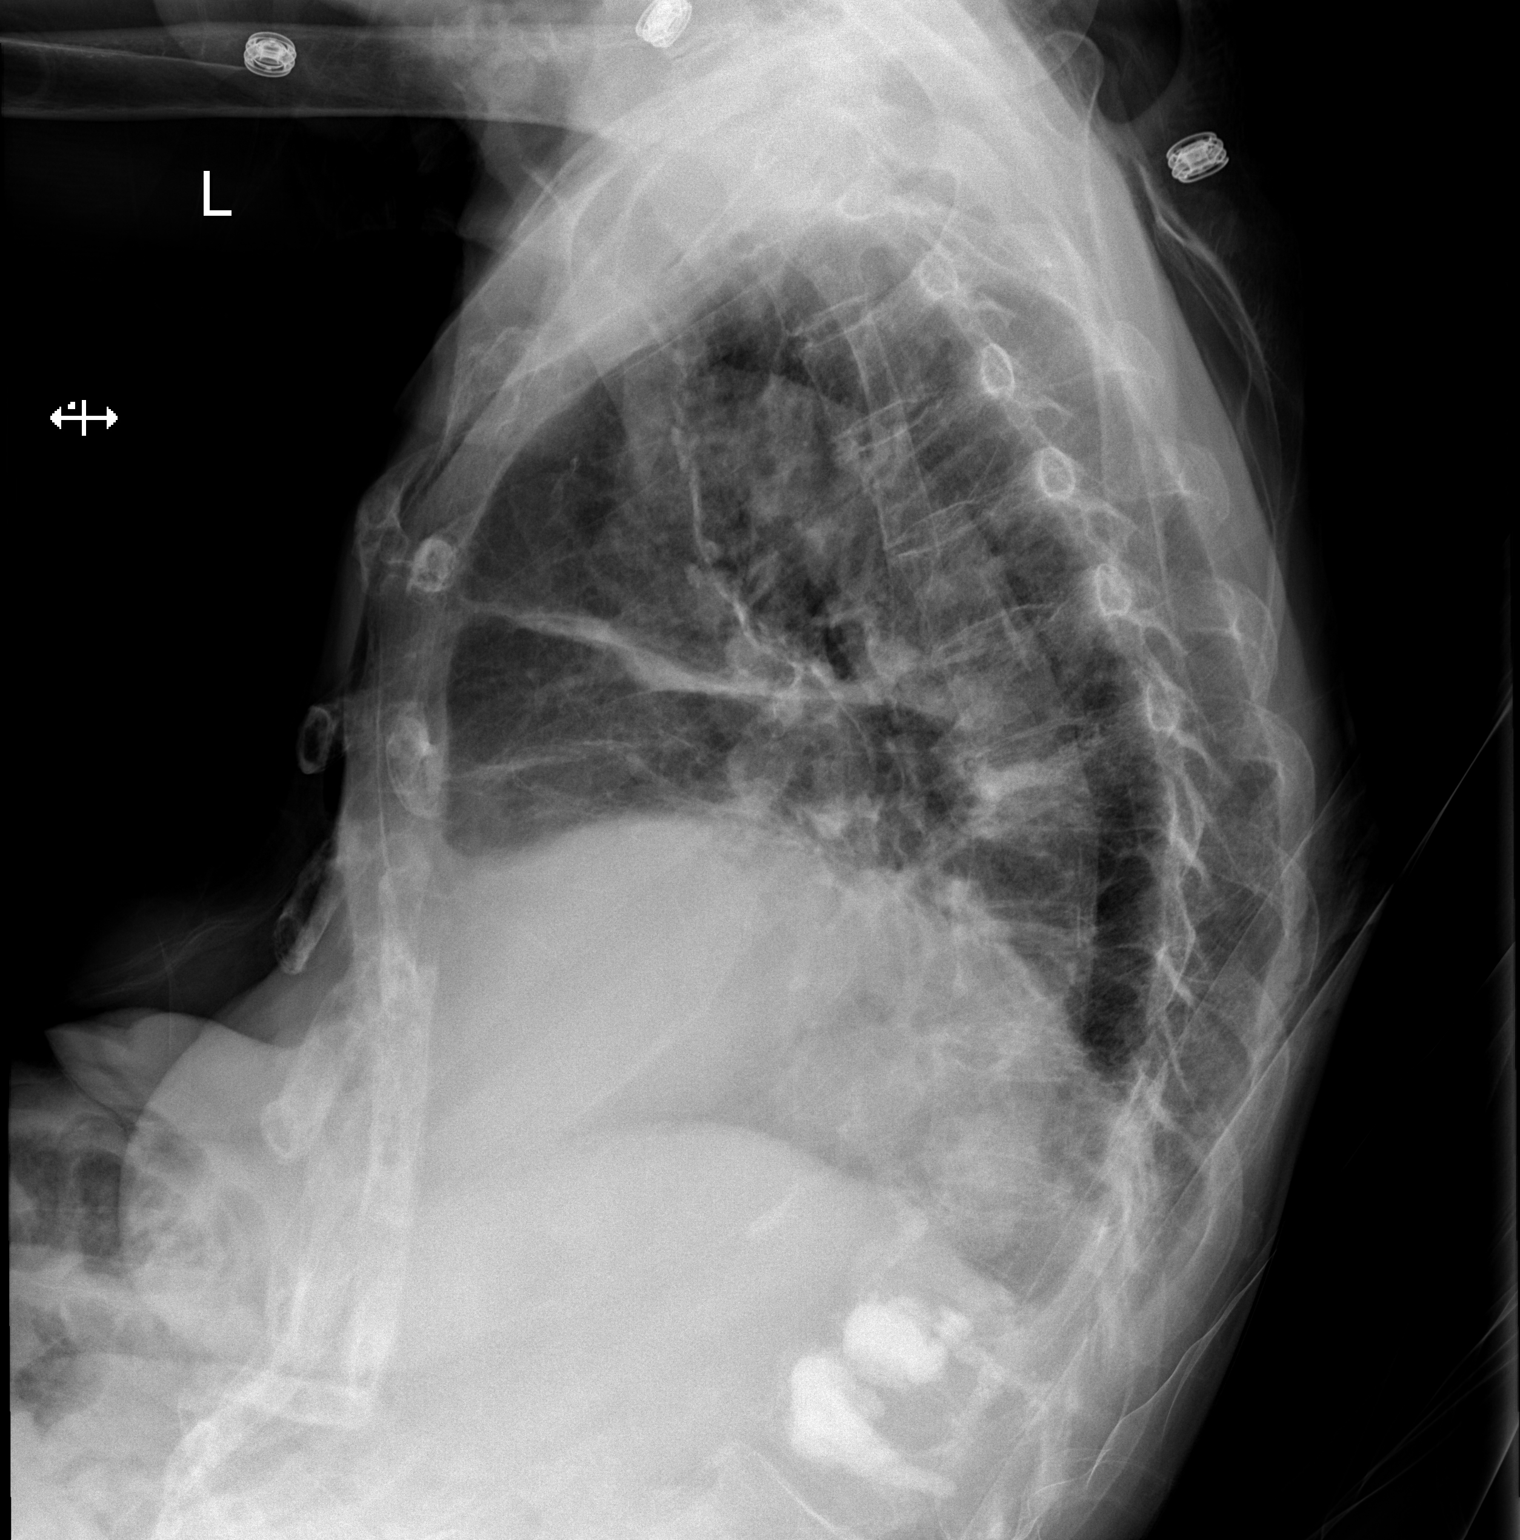

[x chest ap]
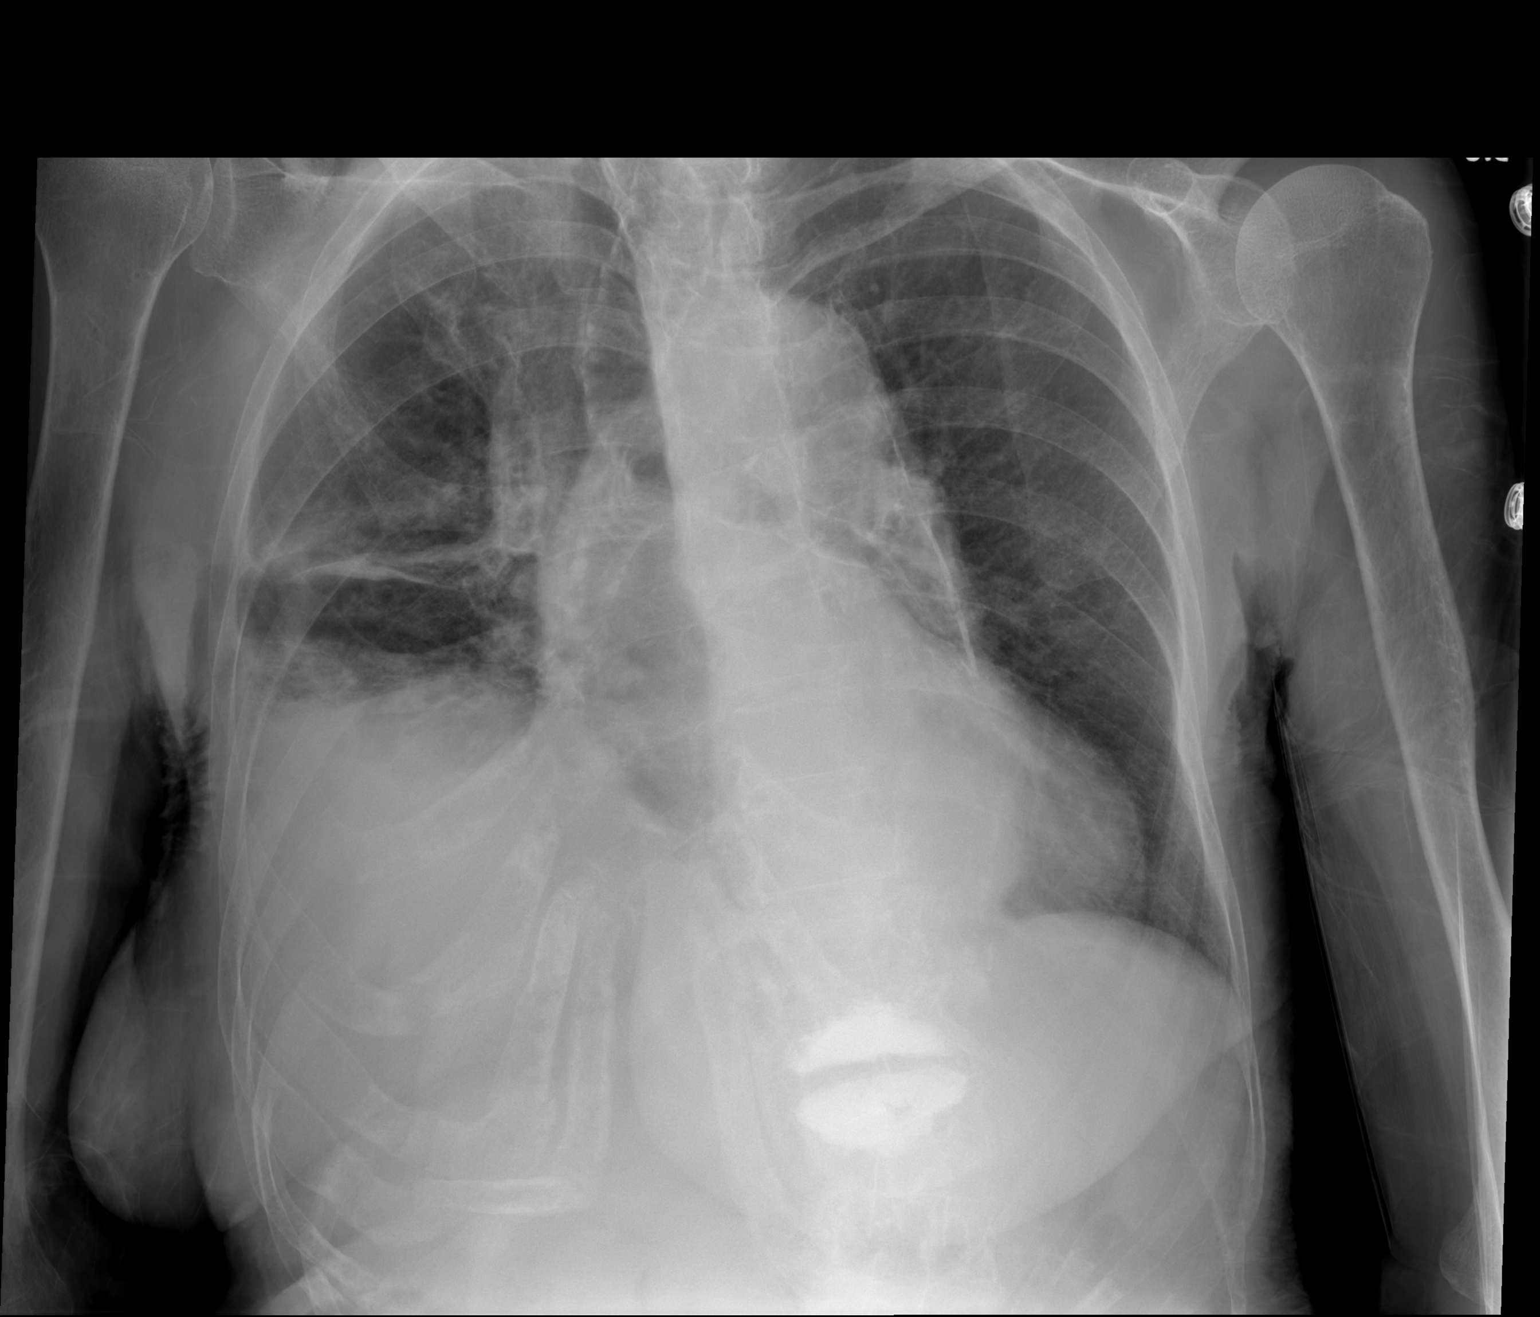

[2 of 2 positions shown; findings below may reference images not displayed]

FINDINGS: Semi upright AP and lateral views of the chest. Chronic elevation of
the right hemidiaphragm. Increased linear, streaky and patchy right
lower lung and perihilar opacity. No definite pleural effusion. The
left lung appears stable and clear. Stable cardiac size and
mediastinal contours. No pneumothorax or pulmonary edema. Stable
visualized osseous structures. Paucity of bowel gas in the upper
abdomen.
IMPRESSION: Chronically elevated right hemidiaphragm but new right lung base
opacity suspicious for pneumonia. No definite pleural effusion.
